# Patient Record
Sex: Female | Born: 1950
Health system: Southern US, Community
[De-identification: ages and names within clinical notes are randomized; demographics above are authoritative.]

## PROBLEM LIST (undated history)

## (undated) DIAGNOSIS — J019 Acute sinusitis, unspecified: Secondary | ICD-10-CM

## (undated) DIAGNOSIS — R011 Cardiac murmur, unspecified: Secondary | ICD-10-CM

## (undated) DIAGNOSIS — I351 Nonrheumatic aortic (valve) insufficiency: Secondary | ICD-10-CM

## (undated) DIAGNOSIS — M79604 Pain in right leg: Secondary | ICD-10-CM

## (undated) DIAGNOSIS — M25551 Pain in right hip: Secondary | ICD-10-CM

## (undated) DIAGNOSIS — I10 Essential (primary) hypertension: Secondary | ICD-10-CM

## (undated) DIAGNOSIS — E785 Hyperlipidemia, unspecified: Secondary | ICD-10-CM

## (undated) DIAGNOSIS — M8580 Other specified disorders of bone density and structure, unspecified site: Secondary | ICD-10-CM

## (undated) DIAGNOSIS — J069 Acute upper respiratory infection, unspecified: Secondary | ICD-10-CM

## (undated) DIAGNOSIS — R5383 Other fatigue: Secondary | ICD-10-CM

## (undated) DIAGNOSIS — E538 Deficiency of other specified B group vitamins: Secondary | ICD-10-CM

## (undated) DIAGNOSIS — M25512 Pain in left shoulder: Secondary | ICD-10-CM

## (undated) DIAGNOSIS — I5189 Other ill-defined heart diseases: Secondary | ICD-10-CM

## (undated) HISTORY — DX: Pain in left shoulder: M25.512

## (undated) HISTORY — DX: Essential (primary) hypertension: I10

## (undated) HISTORY — DX: Other ill-defined heart diseases: I51.89

## (undated) HISTORY — DX: Cardiac murmur, unspecified: R01.1

## (undated) HISTORY — DX: Acute upper respiratory infection, unspecified: J06.9

## (undated) HISTORY — DX: Other specified disorders of bone density and structure, unspecified site: M85.80

## (undated) HISTORY — DX: Pain in right hip: M25.551

## (undated) HISTORY — DX: Hyperlipidemia, unspecified: E78.5

## (undated) HISTORY — DX: Other fatigue: R53.83

## (undated) HISTORY — DX: Acute sinusitis, unspecified: J01.90

## (undated) HISTORY — DX: Pain in right leg: M79.604

## (undated) HISTORY — PX: BILATERAL OOPHORECTOMY: SHX1221

## (undated) HISTORY — DX: Nonrheumatic aortic (valve) insufficiency: I35.1

## (undated) HISTORY — PX: ABDOMINAL HYSTERECTOMY: SHX81

## (undated) HISTORY — DX: Deficiency of other specified B group vitamins: E53.8

---

## 1998-02-23 ENCOUNTER — Other Ambulatory Visit: Admission: RE | Admit: 1998-02-23 | Discharge: 1998-02-23 | Payer: Self-pay | Admitting: Gynecology

## 1999-04-24 ENCOUNTER — Other Ambulatory Visit: Admission: RE | Admit: 1999-04-24 | Discharge: 1999-04-24 | Payer: Self-pay | Admitting: Gynecology

## 2000-05-01 ENCOUNTER — Other Ambulatory Visit: Admission: RE | Admit: 2000-05-01 | Discharge: 2000-05-01 | Payer: Self-pay | Admitting: Gynecology

## 2001-03-30 ENCOUNTER — Encounter: Admission: RE | Admit: 2001-03-30 | Discharge: 2001-03-30 | Payer: Self-pay | Admitting: Internal Medicine

## 2001-03-30 ENCOUNTER — Encounter: Payer: Self-pay | Admitting: Internal Medicine

## 2001-07-01 ENCOUNTER — Other Ambulatory Visit: Admission: RE | Admit: 2001-07-01 | Discharge: 2001-07-01 | Payer: Self-pay | Admitting: Gynecology

## 2002-04-16 ENCOUNTER — Encounter: Payer: Self-pay | Admitting: Emergency Medicine

## 2002-04-17 ENCOUNTER — Encounter: Payer: Self-pay | Admitting: Internal Medicine

## 2002-04-17 ENCOUNTER — Inpatient Hospital Stay (HOSPITAL_COMMUNITY): Admission: EM | Admit: 2002-04-17 | Discharge: 2002-04-21 | Payer: Self-pay | Admitting: *Deleted

## 2002-04-19 ENCOUNTER — Encounter: Payer: Self-pay | Admitting: Surgery

## 2002-04-20 ENCOUNTER — Encounter: Payer: Self-pay | Admitting: Internal Medicine

## 2002-05-28 ENCOUNTER — Encounter: Payer: Self-pay | Admitting: Surgery

## 2002-05-28 ENCOUNTER — Encounter: Admission: RE | Admit: 2002-05-28 | Discharge: 2002-05-28 | Payer: Self-pay | Admitting: Surgery

## 2002-10-27 ENCOUNTER — Other Ambulatory Visit: Admission: RE | Admit: 2002-10-27 | Discharge: 2002-10-27 | Payer: Self-pay | Admitting: Gynecology

## 2003-12-01 ENCOUNTER — Other Ambulatory Visit: Admission: RE | Admit: 2003-12-01 | Discharge: 2003-12-01 | Payer: Self-pay | Admitting: Gynecology

## 2004-07-04 ENCOUNTER — Emergency Department (HOSPITAL_COMMUNITY): Admission: EM | Admit: 2004-07-04 | Discharge: 2004-07-04 | Payer: Self-pay | Admitting: Emergency Medicine

## 2007-02-25 ENCOUNTER — Ambulatory Visit: Payer: Self-pay | Admitting: Family Medicine

## 2007-02-25 ENCOUNTER — Encounter (INDEPENDENT_AMBULATORY_CARE_PROVIDER_SITE_OTHER): Payer: Self-pay | Admitting: *Deleted

## 2007-02-25 DIAGNOSIS — I10 Essential (primary) hypertension: Secondary | ICD-10-CM | POA: Insufficient documentation

## 2007-02-25 DIAGNOSIS — M25519 Pain in unspecified shoulder: Secondary | ICD-10-CM | POA: Insufficient documentation

## 2007-03-03 ENCOUNTER — Encounter: Admission: RE | Admit: 2007-03-03 | Discharge: 2007-03-03 | Payer: Self-pay | Admitting: Family Medicine

## 2007-03-04 ENCOUNTER — Encounter (INDEPENDENT_AMBULATORY_CARE_PROVIDER_SITE_OTHER): Payer: Self-pay | Admitting: *Deleted

## 2007-03-11 ENCOUNTER — Encounter: Payer: Self-pay | Admitting: Family Medicine

## 2007-03-11 ENCOUNTER — Ambulatory Visit: Payer: Self-pay

## 2007-03-13 ENCOUNTER — Telehealth (INDEPENDENT_AMBULATORY_CARE_PROVIDER_SITE_OTHER): Payer: Self-pay | Admitting: *Deleted

## 2007-06-19 ENCOUNTER — Encounter: Payer: Self-pay | Admitting: Family Medicine

## 2007-07-13 ENCOUNTER — Encounter (INDEPENDENT_AMBULATORY_CARE_PROVIDER_SITE_OTHER): Payer: Self-pay | Admitting: *Deleted

## 2007-07-28 ENCOUNTER — Ambulatory Visit: Payer: Self-pay | Admitting: Family Medicine

## 2007-07-28 DIAGNOSIS — M899 Disorder of bone, unspecified: Secondary | ICD-10-CM | POA: Insufficient documentation

## 2007-07-28 DIAGNOSIS — M949 Disorder of cartilage, unspecified: Secondary | ICD-10-CM

## 2008-02-25 ENCOUNTER — Ambulatory Visit: Payer: Self-pay | Admitting: Family Medicine

## 2008-02-25 DIAGNOSIS — E785 Hyperlipidemia, unspecified: Secondary | ICD-10-CM | POA: Insufficient documentation

## 2008-03-08 ENCOUNTER — Encounter (INDEPENDENT_AMBULATORY_CARE_PROVIDER_SITE_OTHER): Payer: Self-pay | Admitting: *Deleted

## 2008-03-08 LAB — CONVERTED CEMR LAB
Albumin: 3.6 g/dL (ref 3.5–5.2)
BUN: 8 mg/dL (ref 6–23)
Calcium: 8.7 mg/dL (ref 8.4–10.5)
Cholesterol: 179 mg/dL (ref 0–200)
Creatinine, Ser: 0.7 mg/dL (ref 0.4–1.2)
GFR calc Af Amer: 112 mL/min
Glucose, Bld: 111 mg/dL — ABNORMAL HIGH (ref 70–99)
HDL: 34.7 mg/dL — ABNORMAL LOW (ref >39.0)
LDL Cholesterol: 125 mg/dL — ABNORMAL HIGH (ref 0–99)
Total Protein: 6.4 g/dL (ref 6.0–8.3)
VLDL: 20 mg/dL (ref 0–40)

## 2008-06-20 ENCOUNTER — Ambulatory Visit (HOSPITAL_COMMUNITY): Admission: RE | Admit: 2008-06-20 | Discharge: 2008-06-20 | Payer: Self-pay | Admitting: Family Medicine

## 2008-07-07 ENCOUNTER — Encounter (INDEPENDENT_AMBULATORY_CARE_PROVIDER_SITE_OTHER): Payer: Self-pay | Admitting: *Deleted

## 2008-10-06 ENCOUNTER — Ambulatory Visit: Payer: Self-pay | Admitting: Family Medicine

## 2008-10-06 DIAGNOSIS — M25559 Pain in unspecified hip: Secondary | ICD-10-CM | POA: Insufficient documentation

## 2008-10-14 ENCOUNTER — Telehealth (INDEPENDENT_AMBULATORY_CARE_PROVIDER_SITE_OTHER): Payer: Self-pay | Admitting: *Deleted

## 2008-10-14 ENCOUNTER — Encounter (INDEPENDENT_AMBULATORY_CARE_PROVIDER_SITE_OTHER): Payer: Self-pay | Admitting: *Deleted

## 2008-10-14 DIAGNOSIS — M79609 Pain in unspecified limb: Secondary | ICD-10-CM | POA: Insufficient documentation

## 2008-10-14 LAB — CONVERTED CEMR LAB
ALT: 20 units/L (ref 0–35)
Albumin: 3.9 g/dL (ref 3.5–5.2)
BUN: 5 mg/dL — ABNORMAL LOW (ref 6–23)
Bilirubin, Direct: 0.1 mg/dL (ref 0.0–0.3)
CO2: 28 meq/L (ref 19–32)
Calcium: 9.3 mg/dL (ref 8.4–10.5)
Cholesterol: 200 mg/dL (ref 0–200)
Creatinine, Ser: 0.6 mg/dL (ref 0.4–1.2)
Glucose, Bld: 101 mg/dL — ABNORMAL HIGH (ref 70–99)
HDL: 38 mg/dL — ABNORMAL LOW (ref >39.0)
Sodium: 142 meq/L (ref 135–145)
Total Protein: 6.8 g/dL (ref 6.0–8.3)
Triglycerides: 152 mg/dL — ABNORMAL HIGH (ref 0–149)

## 2008-10-20 ENCOUNTER — Ambulatory Visit: Payer: Self-pay

## 2008-10-20 ENCOUNTER — Encounter: Payer: Self-pay | Admitting: Family Medicine

## 2008-10-27 ENCOUNTER — Encounter (INDEPENDENT_AMBULATORY_CARE_PROVIDER_SITE_OTHER): Payer: Self-pay | Admitting: *Deleted

## 2008-11-10 ENCOUNTER — Ambulatory Visit: Payer: Self-pay | Admitting: Family Medicine

## 2008-11-10 DIAGNOSIS — J069 Acute upper respiratory infection, unspecified: Secondary | ICD-10-CM | POA: Insufficient documentation

## 2008-11-10 DIAGNOSIS — J019 Acute sinusitis, unspecified: Secondary | ICD-10-CM | POA: Insufficient documentation

## 2008-12-12 ENCOUNTER — Telehealth (INDEPENDENT_AMBULATORY_CARE_PROVIDER_SITE_OTHER): Payer: Self-pay | Admitting: *Deleted

## 2009-09-11 ENCOUNTER — Telehealth (INDEPENDENT_AMBULATORY_CARE_PROVIDER_SITE_OTHER): Payer: Self-pay | Admitting: *Deleted

## 2009-10-09 ENCOUNTER — Ambulatory Visit: Payer: Self-pay | Admitting: Family Medicine

## 2009-10-09 DIAGNOSIS — R011 Cardiac murmur, unspecified: Secondary | ICD-10-CM | POA: Insufficient documentation

## 2009-10-09 DIAGNOSIS — R5383 Other fatigue: Secondary | ICD-10-CM

## 2009-10-09 DIAGNOSIS — R5381 Other malaise: Secondary | ICD-10-CM | POA: Insufficient documentation

## 2009-10-11 LAB — CONVERTED CEMR LAB
ALT: 21 units/L (ref 0–35)
AST: 23 units/L (ref 0–37)
Albumin: 3.9 g/dL (ref 3.5–5.2)
Eosinophils Relative: 1.7 % (ref 0.0–5.0)
Folate: 14.6 ng/mL
GFR calc non Af Amer: 109.43 mL/min (ref >60)
Glucose, Bld: 68 mg/dL — ABNORMAL LOW (ref 70–99)
HCT: 39.7 % (ref 36.0–46.0)
Hemoglobin: 13.2 g/dL (ref 12.0–15.0)
Lymphs Abs: 2 10*3/uL (ref 0.7–4.0)
Monocytes Relative: 7.9 % (ref 3.0–12.0)
Neutro Abs: 3.7 10*3/uL (ref 1.4–7.7)
Potassium: 3.6 meq/L (ref 3.5–5.1)
RDW: 14.2 % (ref 11.5–14.6)
Sodium: 143 meq/L (ref 135–145)
TSH: 0.89 microintl units/mL (ref 0.35–5.50)
VLDL: 31.4 mg/dL (ref 0.0–40.0)
WBC: 6.3 10*3/uL (ref 4.5–10.5)

## 2009-10-12 ENCOUNTER — Ambulatory Visit: Payer: Self-pay | Admitting: Family Medicine

## 2009-10-19 ENCOUNTER — Ambulatory Visit: Payer: Self-pay | Admitting: Family Medicine

## 2009-10-19 DIAGNOSIS — E538 Deficiency of other specified B group vitamins: Secondary | ICD-10-CM | POA: Insufficient documentation

## 2009-10-22 ENCOUNTER — Emergency Department (HOSPITAL_COMMUNITY): Admission: EM | Admit: 2009-10-22 | Discharge: 2009-10-22 | Payer: Self-pay | Admitting: Family Medicine

## 2009-10-26 ENCOUNTER — Ambulatory Visit: Payer: Self-pay | Admitting: Family Medicine

## 2009-11-16 ENCOUNTER — Ambulatory Visit: Payer: Self-pay

## 2009-11-16 ENCOUNTER — Ambulatory Visit: Payer: Self-pay | Admitting: Cardiology

## 2009-11-16 ENCOUNTER — Encounter: Payer: Self-pay | Admitting: Family Medicine

## 2009-11-16 ENCOUNTER — Ambulatory Visit (HOSPITAL_COMMUNITY): Admission: RE | Admit: 2009-11-16 | Discharge: 2009-11-16 | Payer: Self-pay | Admitting: Family Medicine

## 2009-11-19 DIAGNOSIS — I519 Heart disease, unspecified: Secondary | ICD-10-CM | POA: Insufficient documentation

## 2009-11-27 ENCOUNTER — Ambulatory Visit: Payer: Self-pay | Admitting: Family Medicine

## 2009-12-12 ENCOUNTER — Ambulatory Visit: Payer: Self-pay | Admitting: Cardiovascular Disease

## 2009-12-12 DIAGNOSIS — R9389 Abnormal findings on diagnostic imaging of other specified body structures: Secondary | ICD-10-CM | POA: Insufficient documentation

## 2009-12-18 ENCOUNTER — Telehealth: Payer: Self-pay | Admitting: Cardiovascular Disease

## 2009-12-19 ENCOUNTER — Encounter (INDEPENDENT_AMBULATORY_CARE_PROVIDER_SITE_OTHER): Payer: Self-pay | Admitting: *Deleted

## 2009-12-19 LAB — CONVERTED CEMR LAB
CO2: 26 meq/L (ref 19–32)
Calcium: 9.2 mg/dL (ref 8.4–10.5)
Creatinine, Ser: 0.7 mg/dL (ref 0.4–1.2)
Glucose, Bld: 84 mg/dL (ref 70–99)

## 2009-12-26 ENCOUNTER — Ambulatory Visit: Payer: Self-pay | Admitting: Cardiology

## 2009-12-26 ENCOUNTER — Ambulatory Visit (HOSPITAL_COMMUNITY): Admission: RE | Admit: 2009-12-26 | Discharge: 2009-12-26 | Payer: Self-pay | Admitting: Cardiovascular Disease

## 2009-12-27 ENCOUNTER — Ambulatory Visit: Payer: Self-pay | Admitting: Family Medicine

## 2010-01-04 ENCOUNTER — Ambulatory Visit: Payer: Self-pay | Admitting: Cardiovascular Disease

## 2010-01-04 DIAGNOSIS — I359 Nonrheumatic aortic valve disorder, unspecified: Secondary | ICD-10-CM | POA: Insufficient documentation

## 2010-01-24 ENCOUNTER — Ambulatory Visit: Payer: Self-pay | Admitting: Family Medicine

## 2010-01-25 ENCOUNTER — Emergency Department (HOSPITAL_COMMUNITY): Admission: EM | Admit: 2010-01-25 | Discharge: 2010-01-25 | Payer: Self-pay | Admitting: Emergency Medicine

## 2010-02-21 ENCOUNTER — Ambulatory Visit: Payer: Self-pay | Admitting: Internal Medicine

## 2010-03-19 ENCOUNTER — Ambulatory Visit: Payer: Self-pay | Admitting: Family Medicine

## 2010-04-17 ENCOUNTER — Ambulatory Visit: Payer: Self-pay | Admitting: Family Medicine

## 2010-05-15 ENCOUNTER — Ambulatory Visit: Payer: Self-pay | Admitting: Family Medicine

## 2010-05-16 LAB — CONVERTED CEMR LAB: Vitamin B-12: >1500 pg/mL — ABNORMAL HIGH (ref 211–911)

## 2010-06-08 ENCOUNTER — Ambulatory Visit: Payer: Self-pay | Admitting: Family Medicine

## 2010-06-15 ENCOUNTER — Telehealth (INDEPENDENT_AMBULATORY_CARE_PROVIDER_SITE_OTHER): Payer: Self-pay | Admitting: *Deleted

## 2010-07-03 ENCOUNTER — Ambulatory Visit (HOSPITAL_COMMUNITY): Admission: RE | Admit: 2010-07-03 | Discharge: 2010-07-03 | Payer: Self-pay | Admitting: Family Medicine

## 2010-09-23 LAB — CONVERTED CEMR LAB
ALT: 22 units/L (ref 0–35)
AST: 21 units/L (ref 0–37)
AST: 23 units/L (ref 0–37)
Albumin: 3.8 g/dL (ref 3.5–5.2)
Alkaline Phosphatase: 82 units/L (ref 39–117)
Alkaline Phosphatase: 87 units/L (ref 39–117)
BUN: 4 mg/dL — ABNORMAL LOW (ref 6–23)
BUN: 5 mg/dL — ABNORMAL LOW (ref 6–23)
Basophils Relative: 0 % (ref 0.0–1.0)
Bilirubin, Direct: 0.1 mg/dL (ref 0.0–0.3)
CO2: 30 meq/L (ref 19–32)
Calcium: 9.1 mg/dL (ref 8.4–10.5)
Chloride: 104 meq/L (ref 96–112)
Chloride: 106 meq/L (ref 96–112)
Cholesterol: 207 mg/dL (ref 0–200)
Cholesterol: 209 mg/dL (ref 0–200)
Creatinine, Ser: 0.6 mg/dL (ref 0.4–1.2)
Direct LDL: 149.3 mg/dL
Eosinophils Relative: 2.8 % (ref 0.0–5.0)
GFR calc Af Amer: 134 mL/min
GFR calc non Af Amer: 110 mL/min
Glucose, Bld: 105 mg/dL — ABNORMAL HIGH (ref 70–99)
HDL: 36.1 mg/dL — ABNORMAL LOW (ref >39.0)
Lymphocytes Relative: 30.2 % (ref 12.0–46.0)
Monocytes Relative: 7 % (ref 3.0–11.0)
Neutro Abs: 3.2 10*3/uL (ref 1.4–7.7)
Platelets: 340 10*3/uL (ref 150–400)
Potassium: 4 meq/L (ref 3.5–5.1)
RBC: 4.2 M/uL (ref 3.87–5.11)
RDW: 16.9 % — ABNORMAL HIGH (ref 11.5–14.6)
Sodium: 140 meq/L (ref 135–145)
Total CHOL/HDL Ratio: 5.4
Total CHOL/HDL Ratio: 5.8
Total Protein: 7.1 g/dL (ref 6.0–8.3)
Triglycerides: 188 mg/dL — ABNORMAL HIGH (ref 0–149)
VLDL: 23 mg/dL (ref 0–40)
VLDL: 38 mg/dL (ref 0–40)
WBC: 5.5 10*3/uL (ref 4.5–10.5)

## 2010-09-25 NOTE — Assessment & Plan Note (Signed)
Summary: 3wk/sl   Visit Type:  3 wk f/u Primary Provider:  Laury Axon  CC:  no cardiac complaints today.  History of Present Illness: 60 yo WF with history of HTN and borderline hyperlipidemia here today for follow up. She was seen several weeks ago for evaluation of abnormal echocardiogram. She tells me that she was seen recently by Dr. Laury Axon and her murmur seemed louder so an echo was ordered at that time. She has had no chest pain, SOB, palpitations, near syncope, syncope, dizziness, fatigue, lower extremity edema. Her echo showed normal LV systolic function, diastolic dysfunction, mild AI, small effusion and possible epicardial mass/aortic root mass. I ordered a cardiac MRI which showed prominent epicardial fat pad with no evidence of aortic or pericardial tumor or mass. She has no complaints today.    Current Medications (verified): 1)  Lisinopril-Hydrochlorothiazide 10-12.5 Mg  Tabs (Lisinopril-Hydrochlorothiazide) .Marland Kitchen.. 1 By Mouth Once Daily 2)  Klor-Con M20 20 Meq  Tbcr (Potassium Chloride Crys Cr) .... Take One Tablet Twice Daily. 3)  Actonel 150 Mg  Tabs (Risedronate Sodium) .Marland Kitchen.. 1 By Mouth Monthly 4)  Juice Plus Fibre  Liqd (Nutritional Supplements) 5)  Vitamin D (Ergocalciferol) 50000 Unit Caps (Ergocalciferol) .Marland Kitchen.. 1 By Mouth Once Weekly. 6)  Vitamin D3 2000 Unit Caps (Cholecalciferol) .Marland Kitchen.. 1 By Mouth Daily. 7)  Aspirin 81 Mg Tbec (Aspirin) .... Take One Tablet By Mouth Daily  Allergies (verified): No Known Drug Allergies  Past History:  Past Medical History: HYPERTENSION (ICD-401.9) Mild Aortic insufficiency HYPERLIPIDEMIA (ICD-272.4) DIASTOLIC DYSFUNCTION (ICD-429.9) CARDIAC MURMUR (ICD-785.2) FATIGUE (ICD-780.79) LEG PAIN, RIGHT (ICD-729.5) HIP PAIN, RIGHT (ICD-719.45) B12 DEFICIENCY (ICD-266.2) SINUSITIS - ACUTE-NOS (ICD-461.9) URI (ICD-465.9) OSTEOPENIA (ICD-733.90) SHOULDER PAIN, LEFT (ICD-719.41) PREVENTIVE HEALTH CARE (ICD-V70.0) FAMILY HISTORY DIABETES 1ST  DEGREE RELATIVE (ICD-V18.0)  Social History: Reviewed history from 12/12/2009 and no changes required. Married, 2 children, 6 grandchildren Former Smoker-smoked for 10 years Alcohol use-no Drug use-no Stays at home Regular exercise-yes  Review of Systems  The patient denies fatigue, malaise, fever, weight gain/loss, vision loss, decreased hearing, hoarseness, chest pain, palpitations, shortness of breath, prolonged cough, wheezing, sleep apnea, coughing up blood, abdominal pain, blood in stool, nausea, vomiting, diarrhea, heartburn, incontinence, blood in urine, muscle weakness, joint pain, leg swelling, rash, skin lesions, headache, fainting, dizziness, depression, anxiety, enlarged lymph nodes, easy bruising or bleeding, and environmental allergies.    Vital Signs:  Patient profile:   60 year old female Height:      64 inches Weight:      167 pounds BMI:     28.77 Pulse rate:   88 / minute Pulse rhythm:   regular BP sitting:   110 / 70  (left arm) Cuff size:   regular  Vitals Entered By: Danielle Rankin, CMA (Jan 04, 2010 3:27 PM)  Physical Exam  General:  General: Well developed, well nourished, NAD HEENT: OP clear, mucus membranes moist Psychiatric: Mood and affect normal Neck: No JVD, no carotid bruits, no thyromegaly, no lymphadenopathy. Lungs:Clear bilaterally, no wheezes, rhonci, crackles CV: RRR no murmurs, gallops rubs Abdomen: soft, NT, ND, BS present Extremities: No edema, pulses 2+.    MRI EXAM  Procedure date:  12/26/2009  Findings:      IMPRESSION: 1. Premature termination of the exam due to claustrophobia with minimal sequences obtained.   2. Prominent epicardial fat encasing the heart.  No pericardial mass noted.  There was also lipomatous atrial septal hypertrophy.   3. Normal LV and RV size and systolic function.  4. Only limited views of the ascending aorta were obtained.  There was no abnormality seen on those limited views.   Impression &  Recommendations:  Problem # 1:  AORTIC VALVE DISORDERS (ICD-424.1) Mild aortic insufficiency with diastolic dysfunction. Repeat echo one year. Cardiac MRI without pericardial mass or aortic mass.   Her updated medication list for this problem includes:    Lisinopril-hydrochlorothiazide 10-12.5 Mg Tabs (Lisinopril-hydrochlorothiazide) .Marland Kitchen... 1 by mouth once daily  Problem # 2:  DIASTOLIC DYSFUNCTION (ICD-429.9)  Volume ok. BP well controlled.   Patient Instructions: 1)  Your physician recommends that you schedule a follow-up appointment in: 1 year 2)  Your physician has requested that you have an echocardiogram.  Echocardiography is a painless test that uses sound waves to create images of your heart. It provides your doctor with information about the size and shape of your heart and how well your heart's chambers and valves are working.  This procedure takes approximately one hour. There are no restrictions for this procedure.  To be done in 1 year

## 2010-09-25 NOTE — Assessment & Plan Note (Signed)
Summary: B12//KN  Nurse Visit   Allergies: No Known Drug Allergies  Medication Administration  Injection # 1:    Medication: Vit B12 1000 mcg    Diagnosis: B12 DEFICIENCY (ICD-266.2)    Route: IM    Site: L deltoid    Exp Date: 10/25/2011    Lot #: [id number]    Mfr: American Regent    Patient tolerated injection without complications    Given by: Jeremy Johann CMA (March 19, 2010 11:06 AM)  Orders Added: 1)  Admin of Therapeutic Inj  intramuscular or subcutaneous [96372] 2)  Vit B12 1000 mcg [J3420]

## 2010-09-25 NOTE — Progress Notes (Signed)
Summary: pls explain echo again  Phone Note Call from Patient Call back at Home Phone [phone] Call back at Work Phone [phone]   Caller: Patient Reason for Call: Talk to Nurse Summary of Call: from last office visit,  pls explain echo again. pt aware she is suppose to be set up for cardiac mri.  Initial call taken by: Lorne Skeens,  December 18, 2009 11:34 AM  Follow-up for Phone Call        talked with patient--has not heard about scheduling cardiac MRI--Crystal at [facility] is out until 12-19-09 and  Marlowe Kays  will call pt after talking with Crystal tomorrow

## 2010-09-25 NOTE — Assessment & Plan Note (Signed)
Summary: FOR MED REFILL//PH   Vital Signs:  Patient profile:   60 year old female Height:      64 inches Weight:      169 pounds BMI:     29.11 Temp:     98.2 degrees F oral Pulse rate:   80 / minute Pulse rhythm:   regular BP sitting:   124 / 78  (left arm) Cuff size:   regular  Vitals Entered By: Army Fossa CMA (October 09, 2009 11:15 AM) CC: Pt here for med refill (on all meds)    History of Present Illness: Pt here for med refills.  Pt c/o extreme fatigue and she is gaining weight.  She is requesting her thyroid be checked.   Hypertension follow-up      This is a 60 year old woman who presents for Hypertension follow-up.  The patient complains of fatigue, but denies lightheadedness, urinary frequency, headaches, edema, impotence, and rash.  The patient denies the following associated symptoms: chest pain, chest pressure, exercise intolerance, dyspnea, palpitations, syncope, leg edema, and pedal edema.  Compliance with medications (by patient report) has been near 100%.  The patient reports that dietary compliance has been good.  The patient reports exercising daily.  Adjunctive measures currently used by the patient include salt restriction.    Hyperlipidemia follow-up      The patient also presents for Hyperlipidemia follow-up.  The patient complains of fatigue, but denies muscle aches, GI upset, abdominal pain, flushing, itching, constipation, and diarrhea.  The patient denies the following symptoms: chest pain/pressure, exercise intolerance, dypsnea, palpitations, syncope, and pedal edema.  Compliance with medications (by patient report) has been near 100%.  Dietary compliance has been good.  The patient reports exercising daily.  Adjunctive measures currently used by the patient include ASA.    Current Medications (verified): 1)  Lisinopril-Hydrochlorothiazide 10-12.5 Mg  Tabs (Lisinopril-Hydrochlorothiazide) .Marland Kitchen.. 1 By Mouth Once Daily 2)  Klor-Con M20 20 Meq  Tbcr  (Potassium Chloride Crys Cr) .... Take One Tablet Twice Daily. 3)  Actonel 150 Mg  Tabs (Risedronate Sodium) .Marland Kitchen.. 1 By Mouth Monthly 4)  Juice Plus Fibre  Liqd (Nutritional Supplements)  Allergies (verified): No Known Drug Allergies  Past History:  Past medical, surgical, family and social histories (including risk factors) reviewed for relevance to current acute and chronic problems.  Past Medical History: Reviewed history from 02/25/2008 and no changes required. Hypertension Osteopenia Hyperlipidemia  Past Surgical History: Reviewed history from 02/25/2007 and no changes required. 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 Family History Hypertension:MOTHER AND FATHER CANCER-MOTHER(BREAST)  Family History Diabetes 1st degree relative-FATHER(DECEASED) HEART-MOTHER(LIVING) SIBLINGS:2-SISTER'S, 1-BROTHER  Social History: Reviewed history from 02/25/2007 and no changes required. Married Former Smoker Alcohol use-no Drug use-no Regular exercise-yes  Review of Systems      See HPI  Physical Exam  General:  Well-developed,well-nourished,in no acute distress; alert,appropriate and cooperative throughout examination Neck:  No deformities, masses, or tenderness noted. Lungs:  Normal respiratory effort, chest expands symmetrically. Lungs are clear to auscultation, no crackles or wheezes. Heart:  normal rate and Grade  2 /6 systolic ejection murmur.   Extremities:  No clubbing, cyanosis, edema, or deformity noted with normal full range of motion of all joints.   Cervical Nodes:  No lymphadenopathy noted Psych:  Cognition and judgment appear intact. Alert and cooperative with normal attention span and concentration. No apparent delusions, illusions, hallucinations   Impression & Recommendations:  Problem # 1:  HYPERLIPIDEMIA (ICD-272.4)  Orders: Venipuncture ([address]) TLB-B12 +  Folate Pnl (82956_21308-M57/QIO) TLB-Lipid Panel (80061-LIPID) TLB-BMP (Basic Metabolic Panel-BMET) (80048-METABOL) TLB-CBC Platelet - w/Differential (85025-CBCD) TLB-Hepatic/Liver Function Pnl (80076-HEPATIC) TLB-TSH (Thyroid Stimulating Hormone) (84443-TSH) TLB-T3, Free (Triiodothyronine) (84481-T3FREE) TLB-T4 (Thyrox), Free [phone]) T-Vitamin D (25-Hydroxy) (32440-10272)  Problem # 2:  OSTEOPENIA (ICD-733.90)  Her updated medication list for this problem includes:    Actonel 150 Mg Tabs (Risedronate sodium) .Marland Kitchen... 1 by mouth monthly  Orders: Venipuncture (53664) TLB-B12 + Folate Pnl (40347_42595-G38/VFI) TLB-Lipid Panel (80061-LIPID) TLB-BMP (Basic Metabolic Panel-BMET) (80048-METABOL) TLB-CBC Platelet - w/Differential (85025-CBCD) TLB-Hepatic/Liver Function Pnl (80076-HEPATIC) TLB-TSH (Thyroid Stimulating Hormone) (84443-TSH) TLB-T3, Free (Triiodothyronine) (84481-T3FREE) TLB-T4 (Thyrox), Free [phone]) T-Vitamin D (25-Hydroxy) [phone])  Problem # 3:  CARDIAC MURMUR (ICD-785.2)  Orders: Echo Referral (Echo)  Problem # 4:  FATIGUE (ICD-780.79)  Orders: Venipuncture (01093) TLB-B12 + Folate Pnl (23557_32202-R42/HCW) TLB-Lipid Panel (80061-LIPID) TLB-BMP (Basic Metabolic Panel-BMET) (80048-METABOL) TLB-CBC Platelet - w/Differential (85025-CBCD) TLB-Hepatic/Liver Function Pnl (80076-HEPATIC) TLB-TSH (Thyroid Stimulating Hormone) (84443-TSH) TLB-T3, Free (Triiodothyronine) (84481-T3FREE) TLB-T4 (Thyrox), Free [phone]) T-Vitamin D (25-Hydroxy) (17616-07371)  Problem # 5:  HYPERTENSION (ICD-401.9)  Her updated medication list for this problem includes:    Lisinopril-hydrochlorothiazide 10-12.5 Mg Tabs (Lisinopril-hydrochlorothiazide) .Marland Kitchen... 1 by mouth once daily  Orders: Venipuncture (06269) TLB-B12 + Folate Pnl (48546_27035-K09/FGH) TLB-Lipid Panel (80061-LIPID) TLB-BMP (Basic Metabolic Panel-BMET) (80048-METABOL) TLB-CBC Platelet - w/Differential  (85025-CBCD) TLB-Hepatic/Liver Function Pnl (80076-HEPATIC) TLB-TSH (Thyroid Stimulating Hormone) (84443-TSH) TLB-T3, Free (Triiodothyronine) (84481-T3FREE) TLB-T4 (Thyrox), Free [phone]) T-Vitamin D (25-Hydroxy) [phone]) Echo Referral (Echo)  BP today: 124/78 Prior BP: 100/72 (11/10/2008)  Labs Reviewed: K+: 3.4 (10/06/2008) Creat: : 0.6 (10/06/2008)   Chol: 200 (10/06/2008)   HDL: 38.0 (10/06/2008)   LDL: 132 (10/06/2008)   TG: 152 (10/06/2008)  Complete Medication List: 1)  Lisinopril-hydrochlorothiazide 10-12.5 Mg Tabs (Lisinopril-hydrochlorothiazide) .Marland Kitchen.. 1 by mouth once daily 2)  Klor-con M20 20 Meq Tbcr (Potassium chloride crys cr) .... Take one tablet twice daily. 3)  Actonel 150 Mg Tabs (Risedronate sodium) .Marland Kitchen.. 1 by mouth monthly 4)  Juice Plus Fibre Liqd (Nutritional supplements) Prescriptions: LISINOPRIL-HYDROCHLOROTHIAZIDE 10-12.5 MG  TABS (LISINOPRIL-HYDROCHLOROTHIAZIDE) 1 by mouth once daily  #30 Tablet x 5   Entered and Authorized by:   Loreen Freud DO   Signed by:   Loreen Freud DO on 10/09/2009   Method used:   Electronically to        [facility] [phone]* (retail)       [address]       [place], [place]  [address]       Ph: [phone] or [phone]       Fax: [phone]   RxID:   [id number] KLOR-CON M20 20 MEQ  TBCR (POTASSIUM CHLORIDE CRYS CR) TAKE ONE TABLET TWICE DAILY.  #60 x 5   Entered and Authorized by:   Loreen Freud DO   Signed by:   Loreen Freud DO on 10/09/2009   Method used:   Electronically to        [facility] [phone]* (retail)       [address]       [place], [place]  [address]       Ph: [phone] or [phone]       Fax: [phone]   RxID:   [id number]

## 2010-09-25 NOTE — Letter (Signed)
Summary: Appointment - Cardiac MRI  [facility]     [place], [place]    Phone:   Fax:       December 19, 2009 MRN: [id number]   Dominique Gonzalez [address] [place], [place]  [address]   Dear [name],   We have scheduled the above patient for an appointment for a Cardiac MRI on May 3,2011 at  2:00 p.m.  Please refer to the below information for the location and instructions for this test:  Location:     [facility]       [address]       [place], [place]  [address] Instructions:    Wilmon Arms at [facility] Registration 45 minutes prior to your appointment time.  This will ensure you are in the Radiology Department 30 minutes prior to your appointment.    There are no restrictions for this test you may eat and take medications as usual.  If you need to reschedule this appointment please call at the number listed above.  Sincerely,       Lorne Skeens  [facility] Scheduling Team

## 2010-09-25 NOTE — Assessment & Plan Note (Signed)
Summary: b-12 level then b12 inj/cbs  Nurse Visit   Allergies: No Known Drug Allergies  Medication Administration  Injection # 1:    Medication: Vit B12 1000 mcg    Diagnosis: B12 DEFICIENCY (ICD-266.2)    Route: IM    Site: R deltoid    Exp Date: 10/25/2011    Lot #: [id number]    Mfr: American Regent    Patient tolerated injection without complications    Given by: Almeta Monas CMA Duncan Dull) (May 15, 2010 11:48 AM)  Orders Added: 1)  Admin of Therapeutic Inj  intramuscular or subcutaneous [96372] 2)  Vit B12 1000 mcg [J3420]    Appended Document: b-12 level then b12 inj/cbs

## 2010-09-25 NOTE — Progress Notes (Signed)
  Phone Note Refill Request Message from:  Pharmacy  Refills Requested: Medication #1:  LISINOPRIL-HYDROCHLOROTHIAZIDE 10-12.5 MG  TABS 1 by mouth once daily [facility] [address]................Marland KitchenFelecia Deloach CMA  June 15, 2010 8:08 AM      Prescriptions: LISINOPRIL-HYDROCHLOROTHIAZIDE 10-12.5 MG  TABS (LISINOPRIL-HYDROCHLOROTHIAZIDE) 1 by mouth once daily  #30 Tablet x 5   Entered by:   Jeremy Johann CMA   Authorized by:   Loreen Freud DO   Signed by:   Jeremy Johann CMA on 06/15/2010   Method used:   Printed then faxed to ...       [facility] [phone]* (retail)       [address]       [place], [place]  [address]       Ph: [phone] or [phone]       Fax: [phone]   RxID:   [id number]

## 2010-09-25 NOTE — Assessment & Plan Note (Signed)
Summary: FOR SINUS INFECTION//PH   Vital Signs:  Patient profile:   60 year old female Height:      64 inches (162.56 cm) Weight:      169 pounds (76.82 kg) BMI:     29.11 Temp:     98.1 degrees F (36.72 degrees C) oral BP sitting:   104 / 66  (right arm) Cuff size:   regular  Vitals Entered By: Dominique Gonzalez CMA (June 08, 2010 2:01 PM) CC: C/O sinus infection and ears feeling funny x2 days./kb, URI symptoms Is Patient Diabetic? No Pain Assessment Patient in pain? no      Comments Patient notes that she has been having cough, HA, sinus pressure, and nausea. She denies congestion, mucous production, sore throat, fever, and vomiting./kb   CC:  C/O sinus infection and ears feeling funny x2 days./kb and URI symptoms.  History of Present Illness:  URI Symptoms      This is a 60 year old woman who presents with URI symptoms.  The symptoms began 2 days ago.  Pt here c/o head pressure and ear pain.  The patient reports earache, but denies nasal congestion, clear nasal discharge, purulent nasal discharge, and sore throat.  The patient denies fever, low-grade fever (<100.5 degrees), fever of 100.5-103 degrees, fever of 103.1-104 degrees, fever to >104 degrees, stiff neck, dyspnea, wheezing, rash, vomiting, diarrhea, use of an antipyretic, and response to antipyretic.  The patient also reports headache.  The patient denies the following risk factors for Strep sinusitis: unilateral facial pain, unilateral nasal discharge, poor response to decongestant, double sickening, tooth pain, Strep exposure, tender adenopathy, and absence of cough.    Current Medications (verified): 1)  Lisinopril-Hydrochlorothiazide 10-12.5 Mg  Tabs (Lisinopril-Hydrochlorothiazide) .Marland Kitchen.. 1 By Mouth Once Daily 2)  Klor-Con M20 20 Meq  Tbcr (Potassium Chloride Crys Cr) .... Take One Tablet Twice Daily. 3)  Actonel 150 Mg  Tabs (Risedronate Sodium) .Marland Kitchen.. 1 By Mouth Monthly 4)  Juice Plus Fibre  Liqd (Nutritional  Supplements) 5)  Vitamin D (Ergocalciferol) 50000 Unit Caps (Ergocalciferol) .Marland Kitchen.. 1 By Mouth Once Weekly. 6)  Vitamin D3 2000 Unit Caps (Cholecalciferol) .Marland Kitchen.. 1 By Mouth Daily. 7)  Aspirin 81 Mg Tbec (Aspirin) .... Take One Tablet By Mouth Daily 8)  Veramyst 27.5 Mcg/spray Susp (Fluticasone Furoate) .... 2 Sprays Each Nostril Once Daily 9)  Astepro 0.15 % Soln (Azelastine Hcl) .... 2 Sprays Each Nostril Once Daily 10)  Clarinex 5 Mg Tabs (Desloratadine) .Marland Kitchen.. 1 By Mouth Once Daily As Needed  Allergies (verified): No Known Drug Allergies  Past History:  Past medical, surgical, family and social histories (including risk factors) reviewed for relevance to current acute and chronic problems.  Past Medical History: Reviewed history from 01/04/2010 and no changes required. HYPERTENSION (ICD-401.9) Mild Aortic insufficiency HYPERLIPIDEMIA (ICD-272.4) DIASTOLIC DYSFUNCTION (ICD-429.9) CARDIAC MURMUR (ICD-785.2) FATIGUE (ICD-780.79) LEG PAIN, RIGHT (ICD-729.5) HIP PAIN, RIGHT (ICD-719.45) B12 DEFICIENCY (ICD-266.2) SINUSITIS - ACUTE-NOS (ICD-461.9) URI (ICD-465.9) OSTEOPENIA (ICD-733.90) SHOULDER PAIN, LEFT (ICD-719.41) PREVENTIVE HEALTH CARE (ICD-V70.0) FAMILY HISTORY DIABETES 1ST DEGREE RELATIVE (ICD-V18.0)  Past Surgical History: Reviewed history from 12/12/2009 and no changes required. 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 Family History Hypertension:MOTHER AND FATHER CANCER-MOTHER(BREAST) Family History Diabetes 1st degree relative-FATHER(DECEASED) HEART-MOTHER(LIVING) SIBLINGS:2-SISTER'S, 1-BROTHER  Mother- alive, breast cancer, HTN Father-deceased, ? cause on HD with 2 sisters alive, youngest with "enlarged heart" 1 brother alive and healthy  Social History: Reviewed history from 12/12/2009 and  no changes required. Married, 2 children, 6  grandchildren Former Smoker-smoked for 10 years Alcohol use-no Drug use-no Stays at home Regular exercise-yes  Review of Systems      See HPI  Physical Exam  General:  Well-developed,well-nourished,in no acute distress; alert,appropriate and cooperative throughout examination Ears:  + fluid b/l  Nose:  External nasal examination shows no deformity or inflammation. Nasal mucosa are pink and moist without lesions or exudates. Mouth:  Oral mucosa and oropharynx without lesions or exudates.  Teeth in good repair. Neck:  No deformities, masses, or tenderness noted. Lungs:  Normal respiratory effort, chest expands symmetrically. Lungs are clear to auscultation, no crackles or wheezes. Heart:  normal rate and no murmur.   Psych:  Cognition and judgment appear intact. Alert and cooperative with normal attention span and concentration. No apparent delusions, illusions, hallucinations   Impression & Recommendations:  Problem # 1:  URI (ICD-465.9)  Her updated medication list for this problem includes:    Aspirin 81 Mg Tbec (Aspirin) .Marland Kitchen... Take one tablet by mouth daily    Clarinex 5 Mg Tabs (Desloratadine) .Marland Kitchen... 1 by mouth once daily as needed  Instructed on symptomatic treatment. Call if symptoms persist or worsen.   Complete Medication List: 1)  Lisinopril-hydrochlorothiazide 10-12.5 Mg Tabs (Lisinopril-hydrochlorothiazide) .Marland Kitchen.. 1 by mouth once daily 2)  Klor-con M20 20 Meq Tbcr (Potassium chloride crys cr) .... Take one tablet twice daily. 3)  Actonel 150 Mg Tabs (Risedronate sodium) .Marland Kitchen.. 1 by mouth monthly 4)  Juice Plus Fibre Liqd (Nutritional supplements) 5)  Vitamin D (ergocalciferol) 50000 Unit Caps (Ergocalciferol) .Marland Kitchen.. 1 by mouth once weekly. 6)  Vitamin D3 2000 Unit Caps (Cholecalciferol) .Marland Kitchen.. 1 by mouth daily. 7)  Aspirin 81 Mg Tbec (Aspirin) .... Take one tablet by mouth daily 8)  Veramyst 27.5 Mcg/spray Susp (Fluticasone furoate) .... 2 sprays each nostril once daily 9)   Astepro 0.15 % Soln (Azelastine hcl) .... 2 sprays each nostril once daily 10)  Clarinex 5 Mg Tabs (Desloratadine) .Marland Kitchen.. 1 by mouth once daily as needed  Patient Instructions: 1)  Get plenty of rest, drink lots of clear liquids, and use Tylenol or Ibuprofen for fever and comfort. Return in 7-10 days if you're not better: sooner if you'er feeling worse.

## 2010-09-25 NOTE — Progress Notes (Signed)
Summary: refill  Phone Note Refill Request Message from:  Fax from [facility] fax [phone]  Refills Requested: Medication #1:  LISINOPRIL-HYDROCHLOROTHIAZIDE 10-12.5 MG  TABS 1 by mouth once daily Initial call taken by: Barb Merino,  September 11, 2009 12:20 PM    Prescriptions: LISINOPRIL-HYDROCHLOROTHIAZIDE 10-12.5 MG  TABS (LISINOPRIL-HYDROCHLOROTHIAZIDE) 1 by mouth once daily  #30 Tablet x 0   Entered by:   Army Fossa CMA   Authorized by:   Loreen Freud DO   Signed by:   Army Fossa CMA on 09/11/2009   Method used:   Electronically to        [facility] [phone]* (retail)       [address]       [place], [place]  [address]       Ph: [phone] or [phone]       Fax: [phone]   RxID:   [id number]

## 2010-09-25 NOTE — Assessment & Plan Note (Signed)
Summary: b-12//ph  time12:00  Nurse Visit   Allergies: No Known Drug Allergies  Medication Administration  Injection # 1:    Medication: Vit B12 1000 mcg    Diagnosis: B12 DEFICIENCY (ICD-266.2)    Route: IM    Site: L deltoid    Exp Date: 09/2010    Lot #: [id number]    Mfr: American Regent    Patient tolerated injection without complications    Given by: Shonna Chock (November 27, 2009 12:05 PM)  Orders Added: 1)  Vit B12 1000 mcg [J3420] 2)  Admin of Therapeutic Inj  intramuscular or subcutaneous [96372]  Patient aware at next B12 injection she is due to have labs (b12 level checked and then injection)./ Shonna Chock  November 27, 2009 12:06 PM

## 2010-09-25 NOTE — Assessment & Plan Note (Signed)
Summary: B-12 SHOT//PH  Nurse Visit   Allergies: No Known Drug Allergies  Medication Administration  Injection # 1:    Medication: Vit B12 1000 mcg    Diagnosis: B12 DEFICIENCY (ICD-266.2)    Route: IM    Site: R deltoid    Exp Date: 06/27/2011    Lot #: [id number]    Mfr: [employer] Regent    Given by: Doristine Devoid (October 26, 2009 12:07 PM)  Orders Added: 1)  Vit B12 1000 mcg [J3420] 2)  Admin of Therapeutic Inj  intramuscular or subcutaneous [96372]     Medication Administration  Injection # 1:    Medication: Vit B12 1000 mcg    Diagnosis: B12 DEFICIENCY (ICD-266.2)    Route: IM    Site: R deltoid    Exp Date: 06/27/2011    Lot #: [id number]    Mfr: [employer] Regent    Given by: Doristine Devoid (October 26, 2009 12:07 PM)  Orders Added: 1)  Vit B12 1000 mcg [J3420] 2)  Admin of Therapeutic Inj  intramuscular or subcutaneous [19147]

## 2010-09-25 NOTE — Assessment & Plan Note (Signed)
Summary: b-12/cbs  Nurse Visit   Allergies: No Known Drug Allergies  Medication Administration  Injection # 1:    Medication: Vit B12 1000 mcg    Diagnosis: B12 DEFICIENCY (ICD-266.2)    Route: IM    Site: R deltoid    Exp Date: 11/2011    Lot #: [id number]    Mfr: [employer]    Patient tolerated injection without complications    Given by: Army Fossa CMA (January 24, 2010 9:18 AM)  Orders Added: 1)  Admin of Therapeutic Inj  intramuscular or subcutaneous [96372] 2)  Vit B12 1000 mcg [J3420]

## 2010-09-25 NOTE — Assessment & Plan Note (Signed)
Summary: b-12/cbs  Nurse Visit   Allergies: No Known Drug Allergies  Medication Administration  Injection # 1:    Medication: Vit B12 1000 mcg    Diagnosis: B12 DEFICIENCY (ICD-266.2)    Route: IM    Site: R deltoid    Exp Date: 10/25/2011    Lot #: [id number]    Mfr: [employer] Regent  Orders Added: 1)  Vit B12 1000 mcg [J3420] 2)  Admin of Therapeutic Inj  intramuscular or subcutaneous [16109]

## 2010-09-25 NOTE — Assessment & Plan Note (Signed)
Summary: b 12 inj/cbs  Nurse Visit   Allergies: No Known Drug Allergies  Medication Administration  Injection # 1:    Medication: Vit B12 1000 mcg    Diagnosis: B12 DEFICIENCY (ICD-266.2)    Route: IM    Site: R deltoid    Exp Date: 10/2011    Lot #: [id number]    Mfr: [employer] Regent    Patient tolerated injection without complications    Given by: Army Fossa CMA (February 21, 2010 10:49 AM)  Orders Added: 1)  Admin of Therapeutic Inj  intramuscular or subcutaneous [96372] 2)  Vit B12 1000 mcg [J3420]

## 2010-09-25 NOTE — Assessment & Plan Note (Signed)
Summary: B12 SHOT/B12 LEVEL CHECK/KDC  Nurse Visit   Allergies: No Known Drug Allergies  Medication Administration  Injection # 1:    Medication: Vit B12 1000 mcg    Diagnosis: B12 DEFICIENCY (ICD-266.2)    Route: IM    Site: L deltoid    Exp Date: 06/27/2011    Lot #: [id number]    Mfr: [employer] Regent    Given by: Doristine Devoid (Dec 27, 2009 12:11 PM)  Orders Added: 1)  Venipuncture [96045] 2)  TLB-B12 + Folate Pnl [82746_82607-B12/FOL] 3)  Vit B12 1000 mcg [J3420] 4)  Admin of Therapeutic Inj  intramuscular or subcutaneous [96372]   Medication Administration  Injection # 1:    Medication: Vit B12 1000 mcg    Diagnosis: B12 DEFICIENCY (ICD-266.2)    Route: IM    Site: L deltoid    Exp Date: 06/27/2011    Lot #: [id number]    Mfr: [employer]ent    Given by: Doristine Devoid (Dec 27, 2009 12:11 PM)  Orders Added: 1)  Venipuncture [11914] 2)  TLB-B12 + Folate Pnl [82746_82607-B12/FOL] 3)  Vit B12 1000 mcg [J3420] 4)  Admin of Therapeutic Inj  intramuscular or subcutaneous [78295]

## 2010-09-25 NOTE — Assessment & Plan Note (Signed)
Summary: np6/ dystolic dysfuntion pt is self pay/ gd   Visit Type:  new pt visit Primary Provider:  Laury Axon  CC:  pt has h/o heart murmur...denies any cp.....  History of Present Illness: 60 yo WF with history of HTN and borderline hyperlipidemia here today for evaluation of abnormal echocardiogram. She tells me that she was seen recently by Dr. Laury Axon and her murmur seemed louder. She has had no chest pain, SOB, palpitations, near syncope, syncope, dizziness, fatigue, lower extremity edema. Her echo showed normal LV systolic function, diastolic dysfunction, mild AI, small effusion and possible epicardial mass/aortic root mass.   Current Medications (verified): 1)  Lisinopril-Hydrochlorothiazide 10-12.5 Mg  Tabs (Lisinopril-Hydrochlorothiazide) .Marland Kitchen.. 1 By Mouth Once Daily 2)  Klor-Con M20 20 Meq  Tbcr (Potassium Chloride Crys Cr) .... Take One Tablet Twice Daily. 3)  Actonel 150 Mg  Tabs (Risedronate Sodium) .Marland Kitchen.. 1 By Mouth Monthly 4)  Juice Plus Fibre  Liqd (Nutritional Supplements) 5)  Vitamin D (Ergocalciferol) 50000 Unit Caps (Ergocalciferol) .Marland Kitchen.. 1 By Mouth Once Weekly. 6)  Vitamin D3 2000 Unit Caps (Cholecalciferol) .Marland Kitchen.. 1 By Mouth Daily. 7)  Aspirin 81 Mg Tbec (Aspirin) .... Take One Tablet By Mouth Daily  Allergies (verified): No Known Drug Allergies  Past History:  Past Medical History: Reviewed history from 12/06/2009 and no changes required. 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 Family History Hypertension:MOTHER AND FATHER CANCER-MOTHER(BREAST) Family History Diabetes 1st degree relative-FATHER(DECEASED) HEART-MOTHER(LIVING) SIBLINGS:2-SISTER'S, 1-BROTHER  Mother- alive, breast cancer, HTN Father-deceased, ? cause on HD with 2 sisters alive, youngest with "enlarged heart" 1 brother alive and healthy  Social History: Reviewed history from 02/25/2007 and no changes required. Married, 2 children, 6 grandchildren Former Smoker-smoked for 10 years Alcohol use-no Drug use-no Stays at home Regular exercise-yes  Review of Systems  The patient denies fatigue, malaise, fever, weight gain/loss, vision loss, decreased hearing, hoarseness, chest pain, palpitations, shortness of breath, prolonged cough, wheezing, sleep apnea, coughing up blood, abdominal pain, blood in stool, nausea, vomiting, diarrhea, heartburn, incontinence, blood in urine, muscle weakness, joint pain, leg swelling, rash, skin lesions, headache, fainting, dizziness, depression, anxiety, enlarged lymph nodes, easy bruising or bleeding, and environmental allergies.    Vital Signs:  Patient profile:   60 year old female Height:      64 inches Weight:      167 pounds BMI:     28.77 Pulse rate:   85 / minute Pulse rhythm:   irregular BP sitting:   100 / 62  (left arm) Cuff size:   regular  Vitals Entered By: Danielle Rankin, CMA (December 12, 2009 3:27 PM)  Physical Exam  General:  General: Well developed, well nourished, NAD HEENT: OP clear, mucus membranes moist SKIN: warm, dry Neuro: No focal deficits Musculoskeletal: Muscle strength 5/5 all ext Psychiatric: Mood and affect normal Neck: No JVD, no carotid bruits, no thyromegaly, no lymphadenopathy. Lungs:Clear bilaterally, no wheezes, rhonci, crackles CV: RRR with faint systolic  murmur, No gallops rubs Abdomen: soft, NT, ND, BS present Extremities: No  edema, pulses 2+.    Echocardiogram  Procedure date:  11/16/2009  Findings:  Left ventricle: The cavity size was normal. Wall thickness was       normal. Systolic function was normal. The estimated ejection       fraction was in the range of 55% to 60%. Regional wall motion       abnormalities cannot be excluded. Doppler parameters are       consistent with abnormal left ventricular relaxation (grade 1       diastolic dysfunction).     - Aortic valve: Mild regurgitation.     - Pericardium, extracardiac: A small pericardial effusion was       identified.     Impressions:            - Possible prominent epicardial fat; cannot exclude pericardial mass       or infiltrative process; echobright area in ascending aorta or       uncertain significance; recommend cardiac MRI to further evaluate.  Impression & Recommendations:  Problem # 1:  ECHOCARDIOGRAM, ABNORMAL (ICD-793.2) We have reviewed her echo in detail. She does have normal LV systolic function with grade 1 diastolic dysfunction. Her mild aortic regurgitation is stable from echo 2008. There is a prominent epicardial fat pad vs pericardial mass and an echobright area in the ascending aorta  of uncertain significance. I will order a cardiac MRI to further assess. I will see her back in several weeks to review the MRI.   Orders: EKG w/ Interpretation (93000) TLB-BMP (Basic Metabolic Panel-BMET) (80048-METABOL) Cardiac MRI (Cardiac MRI)  Patient Instructions: 1)  Your physician recommends that you schedule a follow-up appointment in: 3 weeks 2)  Your physician has requested that you have a cardiac MRI.  Cardiac MRI uses a computer to create images of your heart as it's beating, producing both still and moving pictures of your heart and major blood vessels. For further information please visit  [email or web address]  Please follow the instruction sheet given to you today for more information.

## 2010-10-08 ENCOUNTER — Ambulatory Visit: Payer: Self-pay | Admitting: Family Medicine

## 2010-10-09 ENCOUNTER — Encounter: Payer: Self-pay | Admitting: Family Medicine

## 2010-10-09 ENCOUNTER — Ambulatory Visit (INDEPENDENT_AMBULATORY_CARE_PROVIDER_SITE_OTHER): Payer: Self-pay | Admitting: Family Medicine

## 2010-10-09 ENCOUNTER — Other Ambulatory Visit: Payer: Self-pay | Admitting: Family Medicine

## 2010-10-09 DIAGNOSIS — E538 Deficiency of other specified B group vitamins: Secondary | ICD-10-CM

## 2010-10-09 DIAGNOSIS — E785 Hyperlipidemia, unspecified: Secondary | ICD-10-CM

## 2010-10-09 DIAGNOSIS — M899 Disorder of bone, unspecified: Secondary | ICD-10-CM

## 2010-10-09 DIAGNOSIS — N952 Postmenopausal atrophic vaginitis: Secondary | ICD-10-CM

## 2010-10-09 DIAGNOSIS — M949 Disorder of cartilage, unspecified: Secondary | ICD-10-CM

## 2010-10-09 DIAGNOSIS — I10 Essential (primary) hypertension: Secondary | ICD-10-CM

## 2010-10-09 LAB — LIPID PANEL
HDL: 37.4 mg/dL — ABNORMAL LOW (ref >39.00)
Total CHOL/HDL Ratio: 5
VLDL: 20 mg/dL (ref 0.0–40.0)

## 2010-10-09 LAB — BASIC METABOLIC PANEL
Calcium: 9.1 mg/dL (ref 8.4–10.5)
Creatinine, Ser: 0.7 mg/dL (ref 0.4–1.2)
GFR: 88.36 mL/min (ref >60.00)
Glucose, Bld: 74 mg/dL (ref 70–99)
Sodium: 142 mEq/L (ref 135–145)

## 2010-10-09 LAB — CBC WITH DIFFERENTIAL/PLATELET
Basophils Absolute: 0 10*3/uL (ref 0.0–0.1)
Hemoglobin: 13.3 g/dL (ref 12.0–15.0)
Lymphocytes Relative: 32.9 % (ref 12.0–46.0)
Monocytes Relative: 7.3 % (ref 3.0–12.0)
Neutro Abs: 3.1 10*3/uL (ref 1.4–7.7)
Neutrophils Relative %: 57.3 % (ref 43.0–77.0)
RDW: 13.7 % (ref 11.5–14.6)

## 2010-10-09 LAB — HEPATIC FUNCTION PANEL
AST: 20 U/L (ref 0–37)
Albumin: 3.8 g/dL (ref 3.5–5.2)
Alkaline Phosphatase: 81 U/L (ref 39–117)
Bilirubin, Direct: 0.1 mg/dL (ref 0.0–0.3)
Total Bilirubin: 0.9 mg/dL (ref 0.3–1.2)

## 2010-10-09 LAB — B12 AND FOLATE PANEL: Folate: 21.7 ng/mL (ref >5.9)

## 2010-10-11 LAB — CONVERTED CEMR LAB: Vit D, 25-Hydroxy: 10 ng/mL — ABNORMAL LOW (ref 30–89)

## 2010-10-17 NOTE — Assessment & Plan Note (Signed)
Summary: stopped b-12 shots, now feeling like she did before she start...   Vital Signs:  Patient profile:   60 year old female Weight:      167.6 pounds Pulse rate:   88 / minute Pulse rhythm:   regular BP sitting:   118 / 80  (right arm) Cuff size:   regular  Vitals Entered By: Almeta Monas CMA Duncan Dull) (October 09, 2010 10:33 AM) CC: stopped B-12 and now feeling bad would like to restart- and wants an Rx for premarin---LABS ARE DUE -- pt is Fasting   History of Present Illness: Pt here c/o increase fatigue ---she would like b12 checked again.  Pt has been taking b12 sublingual. Pt would also like to go back on premarin cream for vaginal dryness.  She states she understands the risks but would rather have it then be so dry.  No other complaints.    Current Medications (verified): 1)  Lisinopril-Hydrochlorothiazide 10-12.5 Mg  Tabs (Lisinopril-Hydrochlorothiazide) .Marland Kitchen.. 1 By Mouth Once Daily 2)  Klor-Con M20 20 Meq  Tbcr (Potassium Chloride Crys Cr) .... Take One Tablet Twice Daily. 3)  Actonel 150 Mg  Tabs (Risedronate Sodium) .Marland Kitchen.. 1 By Mouth Monthly 4)  Juice Plus Fibre  Liqd (Nutritional Supplements) 5)  Vitamin D (Ergocalciferol) 50000 Unit Caps (Ergocalciferol) .Marland Kitchen.. 1 By Mouth Once Weekly. 6)  Vitamin D3 2000 Unit Caps (Cholecalciferol) .Marland Kitchen.. 1 By Mouth Daily. 7)  Aspirin 81 Mg Tbec (Aspirin) .... Take One Tablet By Mouth Daily 8)  Veramyst 27.5 Mcg/spray Susp (Fluticasone Furoate) .... 2 Sprays Each Nostril Once Daily 9)  Astepro 0.15 % Soln (Azelastine Hcl) .... 2 Sprays Each Nostril Once Daily 10)  Clarinex 5 Mg Tabs (Desloratadine) .Marland Kitchen.. 1 By Mouth Once Daily As Needed 11)  Premarin 0.625 Mg/gm Crea (Estrogens, Conjugated) .Marland Kitchen.. 1g Pv Once Daily For 1 Week Then Slowly Decrease To 1x A Week  Allergies (verified): No Known Drug Allergies  Past History:  Past medical, surgical, family and social histories (including risk factors) reviewed for relevance to current acute and  chronic problems.  Past Medical History: Reviewed history from 01/04/2010 and no changes required. HYPERTENSION (ICD-401.9) Mild Aortic insufficiency HYPERLIPIDEMIA (ICD-272.4) DIASTOLIC DYSFUNCTION (ICD-429.9) CARDIAC MURMUR (ICD-785.2) FATIGUE (ICD-780.79) LEG PAIN, RIGHT (ICD-729.5) HIP PAIN, RIGHT (ICD-719.45) B12 DEFICIENCY (ICD-266.2) SINUSITIS - ACUTE-NOS (ICD-461.9) URI (ICD-465.9) OSTEOPENIA (ICD-733.90) SHOULDER PAIN, LEFT (ICD-719.41) PREVENTIVE HEALTH CARE (ICD-V70.0) FAMILY HISTORY DIABETES 1ST DEGREE RELATIVE (ICD-V18.0)  Past Surgical History: Reviewed history from 12/12/2009 and no changes required. 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 Family History Hypertension:MOTHER AND FATHER CANCER-MOTHER(BREAST) Family History Diabetes 1st degree relative-FATHER(DECEASED) HEART-MOTHER(LIVING) SIBLINGS:2-SISTER'S, 1-BROTHER  Mother- alive, breast cancer, HTN Father-deceased, ? cause on HD with 2 sisters alive, youngest with "enlarged heart" 1 brother alive and healthy  Social History: Reviewed history from 12/12/2009 and no changes required. Married, 2 children, 6 grandchildren Former Smoker-smoked for 10 years Alcohol use-no Drug use-no Stays at home Regular exercise-yes  Review of Systems      See HPI  Physical Exam  General:  Well-developed,well-nourished,in no acute distress; alert,appropriate and cooperative throughout examination Lungs:  Normal respiratory effort, chest expands symmetrically. Lungs are clear to auscultation, no crackles or wheezes. Heart:  normal rate.  normal rate.   Extremities:  No clubbing, cyanosis, edema, or deformity noted with normal full range of motion of all joints.   Psych:  Cognition and judgment appear intact. Alert and cooperative with normal attention  span and concentration. No apparent  delusions, illusions, hallucinations   Impression & Recommendations:  Problem # 1:  HYPERTENSION (ICD-401.9)  Her updated medication list for this problem includes:    Lisinopril-hydrochlorothiazide 10-12.5 Mg Tabs (Lisinopril-hydrochlorothiazide) .Marland Kitchen... 1 by mouth once daily  Orders: Venipuncture (16109) TLB-Lipid Panel (80061-LIPID) TLB-BMP (Basic Metabolic Panel-BMET) (80048-METABOL) TLB-CBC Platelet - w/Differential (85025-CBCD) TLB-Hepatic/Liver Function Pnl (80076-HEPATIC) TLB-TSH (Thyroid Stimulating Hormone) (84443-TSH) TLB-B12 + Folate Pnl (82746_82607-B12/FOL) T-Vitamin D (25-Hydroxy) (60454-09811) Specimen Handling (91478)  BP today: 118/80 Prior BP: 104/66 (06/08/2010)  Labs Reviewed: K+: 3.6 (12/12/2009) Creat: : 0.7 (12/12/2009)   Chol: 183 (10/09/2009)   HDL: 41.10 (10/09/2009)   LDL: 111 (10/09/2009)   TG: 157.0 (10/09/2009)  Problem # 2:  HYPERLIPIDEMIA (ICD-272.4)  Orders: Venipuncture (29562) TLB-Lipid Panel (80061-LIPID) TLB-BMP (Basic Metabolic Panel-BMET) (80048-METABOL) TLB-CBC Platelet - w/Differential (85025-CBCD) TLB-Hepatic/Liver Function Pnl (80076-HEPATIC) TLB-TSH (Thyroid Stimulating Hormone) (84443-TSH) TLB-B12 + Folate Pnl (82746_82607-B12/FOL) T-Vitamin D (25-Hydroxy) (13086-57846) Specimen Handling (96295)  Labs Reviewed: SGOT: 23 (10/09/2009)   SGPT: 21 (10/09/2009)   HDL:41.10 (10/09/2009), 38.0 (10/06/2008)  LDL:111 (10/09/2009), 132 (10/06/2008)  Chol:183 (10/09/2009), 200 (10/06/2008)  Trig:157.0 (10/09/2009), 152 (10/06/2008)  Problem # 3:  B12 DEFICIENCY (ICD-266.2)  Orders: Venipuncture (28413) TLB-Lipid Panel (80061-LIPID) TLB-BMP (Basic Metabolic Panel-BMET) (80048-METABOL) TLB-CBC Platelet - w/Differential (85025-CBCD) TLB-Hepatic/Liver Function Pnl (80076-HEPATIC) TLB-TSH (Thyroid Stimulating Hormone) (84443-TSH) TLB-B12 + Folate Pnl (82746_82607-B12/FOL) T-Vitamin D (25-Hydroxy) (24401-02725) Specimen Handling  (99000)  Problem # 4:  VAGINITIS, ATROPHIC (ICD-627.3)  Her updated medication list for this problem includes:    Premarin 0.625 Mg/gm Crea (Estrogens, conjugated) .Marland Kitchen... 1g pv once daily for 1 week then slowly decrease to 1x a week---- pt understands risks involved but would still rather go back on med  Discussed treatment options.   Complete Medication List: 1)  Lisinopril-hydrochlorothiazide 10-12.5 Mg Tabs (Lisinopril-hydrochlorothiazide) .Marland Kitchen.. 1 by mouth once daily 2)  Klor-con M20 20 Meq Tbcr (Potassium chloride crys cr) .... Take one tablet twice daily. 3)  Actonel 150 Mg Tabs (Risedronate sodium) .Marland Kitchen.. 1 by mouth monthly 4)  Juice Plus Fibre Liqd (Nutritional supplements) 5)  Vitamin D (ergocalciferol) 50000 Unit Caps (Ergocalciferol) .Marland Kitchen.. 1 by mouth once weekly. 6)  Vitamin D3 2000 Unit Caps (Cholecalciferol) .Marland Kitchen.. 1 by mouth daily. 7)  Aspirin 81 Mg Tbec (Aspirin) .... Take one tablet by mouth daily 8)  Veramyst 27.5 Mcg/spray Susp (Fluticasone furoate) .... 2 sprays each nostril once daily 9)  Astepro 0.15 % Soln (Azelastine hcl) .... 2 sprays each nostril once daily 10)  Clarinex 5 Mg Tabs (Desloratadine) .Marland Kitchen.. 1 by mouth once daily as needed 11)  Premarin 0.625 Mg/gm Crea (Estrogens, conjugated) .Marland Kitchen.. 1g pv once daily for 1 week then slowly decrease to 1x a week  Patient Instructions: 1)  Please schedule a follow-up appointment in 6 months .  Prescriptions: LISINOPRIL-HYDROCHLOROTHIAZIDE 10-12.5 MG  TABS (LISINOPRIL-HYDROCHLOROTHIAZIDE) 1 by mouth once daily  #30 Tablet x 5   Entered and Authorized by:   Loreen Freud DO   Signed by:   Loreen Freud DO on 10/09/2010   Method used:   Electronically to        [facility]  [phone]* (retail)       [address] [place], [place]  [address]       Ph: [phone] or [phone]       Fax: [phone]   RxID:   [id number] PREMARIN 0.625 MG/GM CREA (ESTROGENS, CONJUGATED) 1g pv once daily for 1 week then  slowly  decrease to 1x a week  #1 month x 5   Entered and Authorized by:   Loreen Freud DO   Signed by:   Loreen Freud DO on 10/09/2010   Method used:   Electronically to        [facility]  [phone]* (retail)       [address] [place], [place]  [address]       Ph: [phone] or [phone]       Fax: [phone]   RxID:   [id number]    Orders Added: 1)  Venipuncture [35573] 2)  TLB-Lipid Panel [80061-LIPID] 3)  TLB-BMP (Basic Metabolic Panel-BMET) [80048-METABOL] 4)  TLB-CBC Platelet - w/Differential [85025-CBCD] 5)  TLB-Hepatic/Liver Function Pnl [80076-HEPATIC] 6)  TLB-TSH (Thyroid Stimulating Hormone) [84443-TSH] 7)  TLB-B12 + Folate Pnl [82746_82607-B12/FOL] 8)  T-Vitamin D (25-Hydroxy) [22025-42706] 9)  Est. Patient Level III [23762] 10)  Specimen Handling [99000]

## 2010-11-12 LAB — POCT URINALYSIS DIP (DEVICE)
Bilirubin Urine: NEGATIVE
Glucose, UA: NEGATIVE mg/dL
Ketones, ur: NEGATIVE mg/dL
Nitrite: NEGATIVE
Protein, ur: NEGATIVE mg/dL
Specific Gravity, Urine: 1.015 (ref 1.005–1.030)
Urobilinogen, UA: 0.2 mg/dL (ref 0.0–1.0)
pH: 7.5 (ref 5.0–8.0)

## 2011-01-08 ENCOUNTER — Encounter: Payer: Self-pay | Admitting: Cardiovascular Disease

## 2011-01-10 ENCOUNTER — Encounter: Payer: Self-pay | Admitting: Cardiovascular Disease

## 2011-01-10 ENCOUNTER — Ambulatory Visit (INDEPENDENT_AMBULATORY_CARE_PROVIDER_SITE_OTHER): Payer: Self-pay | Admitting: Cardiovascular Disease

## 2011-01-10 VITALS — BP 102/72 | HR 77 | Resp 17 | Ht 64.0 in | Wt 167.1 lb

## 2011-01-10 DIAGNOSIS — I351 Nonrheumatic aortic (valve) insufficiency: Secondary | ICD-10-CM

## 2011-01-10 DIAGNOSIS — I359 Nonrheumatic aortic valve disorder, unspecified: Secondary | ICD-10-CM

## 2011-01-10 NOTE — Patient Instructions (Signed)
Your physician recommends that you schedule a follow-up appointment in: 1 year with Dr. McALhany  Your physician has requested that you have an echocardiogram. Echocardiography is a painless test that uses sound waves to create images of your heart. It provides your doctor with information about the size and shape of your heart and how well your heart's chambers and valves are working. This procedure takes approximately one hour. There are no restrictions for this procedure.   

## 2011-01-10 NOTE — Assessment & Plan Note (Signed)
Mild aortic insufficiency with small effusion last year. Repeat echo.

## 2011-01-10 NOTE — Progress Notes (Signed)
History of Present Illness:60 yo WF with history of HTN and borderline hyperlipidemia here today for follow up. She was seen one year ago for evaluation of abnormal echocardiogram.  Echo was ordered in workup of a murmur. Her echo in 2011 showed normal LV systolic function, diastolic dysfunction, mild AI, small effusion and possible epicardial mass/aortic root mass. I ordered a cardiac MRI which showed prominent epicardial fat pad with no evidence of aortic or pericardial tumor or mass. She has no complaints today. She notes chronic left shoulder pain. NO chest pain, SOB, palpitations, near syncope or syncope.    Past Medical History  Diagnosis Date  . HTN (hypertension)   . Aortic insufficiency     mild  . Hyperlipidemia   . Diastolic dysfunction   . Cardiac murmur   . Fatigue   . Leg pain, right   . Hip pain, right   . B12 deficiency   . Sinusitis, acute     NOS  . URI (upper respiratory infection)   . Osteopenia   . Shoulder pain, left     Past Surgical History  Procedure Date  . Abdominal hysterectomy     age 37  . Bilateral oophorectomy     Current Outpatient Prescriptions  Medication Sig Dispense Refill  . aspirin 81 MG tablet Take 81 mg by mouth daily.        . Cholecalciferol (VITAMIN D3) 2000 UNITS capsule Take 2,000 Units by mouth daily.        . ergocalciferol (VITAMIN D2) 50000 UNITS capsule Take 50,000 Units by mouth once a week.        Marland Kitchen lisinopril-hydrochlorothiazide (PRINZIDE,ZESTORETIC) 10-12.5 MG per tablet Take 1 tablet by mouth daily.        . Nutritional Supplements (JUICE PLUS FIBRE PO) Take by mouth.        . potassium chloride SA (KLOR-CON M20) 20 MEQ tablet Take 20 mEq by mouth 2 (two) times daily.        . risedronate (ACTONEL) 150 MG tablet Take 150 mg by mouth every 30 (thirty) days. with water on empty stomach, nothing by mouth or lie down for next 30 minutes.       Marland Kitchen DISCONTD: Azelastine HCl (ASTEPRO) 0.15 % SOLN 2 sprays by Nasal route daily.         Marland Kitchen DISCONTD: conjugated estrogens (PREMARIN) vaginal cream 1g pv once daily for 1 week then slowly decrease to 1x a week       . DISCONTD: desloratadine (CLARINEX) 5 MG tablet Take 5 mg by mouth daily.        Marland Kitchen DISCONTD: fluticasone (VERAMYST) 27.5 MCG/SPRAY nasal spray 2 sprays by Nasal route daily.        Marland Kitchen DISCONTD: simvastatin (ZOCOR) 20 MG tablet Take 20 mg by mouth at bedtime.          No Known Allergies  History   Social History  . Marital Status: Married    Spouse Name: N/A    Number of Children: 2  . Years of Education: N/A   Occupational History  . Not on file.   Social History Main Topics  . Smoking status: Former Smoker    Quit date: 01/10/1996  . Smokeless tobacco: Not on file   Comment: smoked for 10 years,   . Alcohol Use: No  . Drug Use: No  . Sexually Active: Not on file   Other Topics Concern  . Not on file   Social History Narrative  .  No narrative on file    Family History  Problem Relation Age of Onset  . Hypertension Father   . Hypertension Mother   . Breast cancer Mother   . Diabetes Father     Review of Systems:  As stated in the HPI and otherwise negative.   BP 102/72  Pulse 77  Resp 17  Ht 5\' 4"  (1.626 m)  Wt 167 lb 1.9 oz (75.805 kg)  BMI 28.69 kg/m2  Physical Examination: General: Well developed, well nourished, NAD HEENT: OP clear, mucus membranes moist SKIN: warm, dry. No rashes. Neuro: No focal deficits Musculoskeletal: Muscle strength 5/5 all ext Psychiatric: Mood and affect normal Neck: No JVD, no carotid bruits, no thyromegaly, no lymphadenopathy. Lungs:Clear bilaterally, no wheezes, rhonci, crackles Cardiovascular: Regular rate and rhythm. No murmurs, gallops or rubs. Abdomen:Soft. Bowel sounds present. Non-tender.  Extremities: No lower extremity edema. Pulses are 2 + in the bilateral DP/PT.  EKG: NSR, low voltage.

## 2011-01-11 NOTE — Discharge Summary (Signed)
NAME:  Dominique Gonzalez, WARGA NO.:  [id number]   MEDICAL RECORD NO.:  [id number]                   PATIENT TYPE:  INP   LOCATION:  5742                                 FACILITY:  [facility]   PHYSICIAN:  Deirdre Peer. Polite, M.D.              DATE OF BIRTH:  August 31, 1950   DATE OF ADMISSION:  04/16/2002  DATE OF DISCHARGE:  04/21/2002                                 DISCHARGE SUMMARY   DISCHARGE DIAGNOSES:  1. Small-bowel obstruction, resolved.  2. Hypertension.  3. History of migraine headaches.   DISCHARGE MEDICATIONS:  1. Hydrochlorothiazide 25 mg daily.  2. Protonix 40 mg daily.  3. Maxalt as needed for migraine headaches.  4. Clarinex as needed.   CONSULTANTS:  1. Dr. Velora Heckler, general surgery.  2. Dr. Sandria Bales. Ezzard Standing, general surgery.   PROCEDURES AND STUDIES:  1. CT of the abdomen and pelvis, April 16, 2002, revealed fluid-filled and     distended loops of small bowel with small amount of adjacent mesenteric     fluid.  Findings are attributed to small-bowel obstruction, likely ileal     in location.  The obstructive point is not clearly identified.  Small     amount of fluid dependently within the pelvis.  Colon is decompressed.     Bladder is distended.  2. Abdominal x-ray, April 17, 2002:  Disproportion in the location of small     bowel, with air-fluid levels worrisome for small-bowel obstruction.  3. Chest x-ray, April 17, 2002:  No evidence for acute cardiopulmonary     disease.  4. Ultrasound of the abdomen, April 17, 2002, revealed trace perihepatic     fluid as noted on prior CT.  Mild renal cortical thinning, otherwise,     unremarkable.  5. Repeat abdominal x-ray, April 19, 2002, with residual barium contrast     within the right and transverse colon.  No evidence of obstruction or     perforation are seen.  Nasogastric tube appears to be in the second     portion of the duodenum.  The bladder is partially distended.  No  significant dilatation of small bowel is seen.  There is no free air.   LABORATORY DATA:  Chemistry on April 20, 2002 with a sodium of 137,  potassium 3.7, chloride 104, CO2 26, glucose 107, BUN 5, creatinine 0.5,  albumin 3.3, AST 20, ALT 14, ALP 93, total bilirubin 0.6.  WBC count 8, RBC  4.14, hemoglobin 12.3, hematocrit 35.9.  Urine was negative.   DISPOSITION:  The patient will be discharged home.   HISTORY OF PRESENT ILLNESS:  [age]-year-old female who presented to  [facility] Emergency Room with a two-week history of nausea and vomiting  with diffuse periumbilical abdominal pain.  The patient stated in the ER  that she initially began to have symptoms approximately two weeks ago with  some resolution.  Days prior to  this admission, her symptoms returned with  abdominal distention.  A CT was done in the emergency room which revealed a  partial small-bowel obstruction.  She was admitted for further evaluation  and treatment.   HOSPITAL COURSE:  #1 -  SMALL-BOWEL OBSTRUCTION:  Again, the admission CT in  the emergency room revealed a partial small-bowel obstruction.  The patient  was admitted to the hospital with IV fluids, NG suction, n.p.o. status, pain  medication and an ultrasound and three-position abdominal x-rays.  On  hospitalization day #2, the patient's abdomen was noted to be decreasing in  size.  She was continued on supportive care.  Her ultrasound and x-ray  results are as above.  She was hypokalemic, most likely felt to be secondary  to IV fluids and she was replaced.  She was seen in surgery consult on  April 18, 2002 by Dr. Gerrit Friends.  His impression was a high-grade small-bowel  obstruction secondary to adhesions from her previous abdominal surgery  approximately 10 years ago.  He did offer the patient operative exploration,  of which she did refuse.  His secondary recommendations were to continue NG  suction, IV fluids and n.p.o. status, recheck an x-ray and  labs in the  morning.  Repeat abdominal x-ray was done on April 19, 2002, which revealed  no obstruction or perforation and no free air.  She did begin to pass some  flatus.  She continued to have gradual resolution of her abdominal symptoms.  She began tolerating a clear liquid diet that was advanced to a full-liquid  diet prior to discharge with no nausea or vomiting.  On the day of  discharge, she has had complete resolution of her abdominal distention and  pain.   #2 -  ELEVATED TEMPERATURE WITH ELEVATED WHITE BLOOD CELL COUNT:  Both  within normal limits on discharge.   #3 -  HYPOKALEMIA:  Hypokalemia resolved with replacement and discharge  potassium 3.7.   #4 -  HYPERTENSION:  Hypertension stable throughout her hospitalization off  of Maxzide.  The patient is to continue to resume Maxzide post discharge.   #5 -  MIGRAINE HEADACHES:  The patient had no complaints throughout this  hospitalization.  She is to continue p.r.n. Maxalt.   FOLLOWUP:  The patient has a followup appointment with Dr. Leanne Chang in  one week, September 3rd at 10:15 a.m.  She also is to follow up with Dr.  Ezzard Standing in four weeks.     Stephanie Swaziland, NP                      Deirdre Peer. Polite, M.D.    SJ/MEDQ  D:  04/21/2002  T:  04/24/2002  Job:  [id number]   cc:   Sandria Bales. Ezzard Standing, M.D.  Fax: [phone]   Leanne Chang, M.D.

## 2011-01-11 NOTE — Consult Note (Signed)
NAME:  Dominique Gonzalez, Dominique Gonzalez NO.:  [id number]   MEDICAL RECORD NO.:  [id number]                   PATIENT TYPE:  INP   LOCATION:  5742                                 FACILITY:   PHYSICIAN:  Velora Heckler, M.D.                DATE OF BIRTH:  1951/06/10   DATE OF CONSULTATION:  04/18/2002  DATE OF DISCHARGE:                           GENERAL SURGERY CONSULTATION   REFERRED BY:  Jackie Plum, M.D.   REASON FOR CONSULTATION:  Small bowel obstruction.   HISTORY:  The patient is a 60 year old white female admitted from [facility] on Friday, April 12, 2002, with a two-week history of  abdominal pain.  This was associated with nausea, vomiting, and abdominal  distention.  The patient had initially been treated with antibiotics for a  sinus infection; however, her symptoms persisted.  The patient experienced  nausea and one episode of emesis.  She does note continued bowel movements.  She has been able to tolerate some oral intake.  On April 16, 2002, the  patient was sent from [facility] to the hospital where a CT  scan of the abdomen and pelvis was performed.  This shows a high-grade  partial small bowel obstruction with some free fluid within the peritoneal  cavity.  Of note, the patient is status post abdominal hysterectomy,  bilateral salpingo-oophorectomy approximately 10 years ago.  Follow-up  abdominal x-rays on August 23rd during her hospitalization showed persistent  small bowel obstruction.  General surgery is consulted at this time for  evaluation and possible operative intervention.   PAST MEDICAL HISTORY:  Status post TAH and BSO approximately 10 years ago.  Hypertension, history of migraine headaches.   MEDICATIONS:  1. Maxalt.  2. Hydrochlorothiazide.  3. Clarinex.   ALLERGIES:  No known drug allergies.   SOCIAL HISTORY:  The patient is married, accompanied by her husband.  She  denies alcohol or  cigarette use.   FAMILY HISTORY:  Noncontributory.   REVIEW OF SYSTEMS:  A 15-system review without significant other positives  except as noted above.   PHYSICAL EXAMINATION:  GENERAL:  A 60 year old well-nourished, well-  developed white female, on ward 5700 at [facility], in  mild discomfort.  VITAL SIGNS:  Temperature 98.1 degrees, pulse 93, respirations 18, blood  pressure 133/76.  O2 saturation 99% on room air.  HEENT:  Normocephalic.  There is no scleral icterus.  Mucous membranes are  moist.  A nasogastric tube was placed in the left naris.  NECK:  Supple without mass.  Thyroid is normal without nodularity.  There is  no adenopathy.  LUNGS:  Clear to auscultation.  HEART:  A regular rate and rhythm, without murmur.  Peripheral pulses are  full.  ABDOMEN:  Soft, mildly distended, with only a few rare bowel sounds.  There  is a well-healed Pfannenstiel incision.  There is no tenderness to  percussion.  There are no palpable masses.  There is mild tenderness to deep  palpation in the right mid abdomen.  There is no rebound tenderness.  There  is no hepatosplenomegaly.  There is no sign of hernia.  EXTREMITIES:  Nontender, without edema.  NEUROLOGIC:  The patient is alert and oriented to person, place, and time,  without focal neurologic deficit.   LABORATORY DATA:  On admission white blood count 10.0, hemoglobin 13.5,  platelet count 316,000.  Differential shows 80% segmented neutrophils.  Chemistry profile is notable for a low potassium of 3.1 and a low albumin of  3.3.  Urinalysis is benign.  Amylase is normal at 51.  A repeat CBC on  April 17, 2002, shows persistent hypokalemia with a potassium of 3.1, and a  slight elevation in the white blood cell count at 11.3.   RADIOGRAPHIC STUDIES:  A CT scan of the abdomen and pelvis from April 16, 2002, demonstrating a small bowel obstruction and a small amount of free  intraperitoneal fluid.  Follow-up  abdominal x-rays from April 17, 2002,  demonstrated findings consistent with high-grade small bowel obstruction.   IMPRESSION:  High-grade small bowel obstruction, likely secondary to  adhesions.   PLAN:  1. I have offered immediate operative exploration for lysis of adhesions for     likely small bowel obstruction due to adhesions from prior hysterectomy.     At this point the patient refuses.  She and her husband wound like to     continue further medical management with nasogastric decompression and     intravenous hydration.  2. Continued nasogastric decompression and intravenous hydration and n.p.o.     status.  3. Repeat abdominal x-ray on April 19, 2002, in the a.m.  4. Repeat laboratory studies on April 19, 2002, in the a.m.  5. Will follow closely.  Apparently the patient has been seen previously in     our practice by Dr. Sandria Bales. Newman for a left breast biopsy which was     benign.  If the patient and the family desire, I will request that Dr.     Ezzard Standing re-evaluate her on Monday, April 19, 2002.  6. Should the patient's clinical course deteriorate, the patient become     febrile, abdominal pain increase, or vital signs become unstable, the     patient will require an urgent laparotomy.                                               Velora Heckler, M.D.    TMG/MEDQ  D:  04/18/2002  T:  04/20/2002  Job:  [id number]

## 2011-01-16 ENCOUNTER — Encounter: Payer: Self-pay | Admitting: Internal Medicine

## 2011-01-16 ENCOUNTER — Ambulatory Visit (INDEPENDENT_AMBULATORY_CARE_PROVIDER_SITE_OTHER): Payer: Self-pay | Admitting: Internal Medicine

## 2011-01-16 VITALS — BP 124/78 | HR 71 | Temp 97.8°F | Wt 168.0 lb

## 2011-01-16 DIAGNOSIS — J029 Acute pharyngitis, unspecified: Secondary | ICD-10-CM

## 2011-01-16 DIAGNOSIS — J069 Acute upper respiratory infection, unspecified: Secondary | ICD-10-CM

## 2011-01-16 NOTE — Assessment & Plan Note (Signed)
Strep test negative. Likely a viral URI, see instructions

## 2011-01-16 NOTE — Progress Notes (Signed)
  Subjective:    Patient ID: Dominique Gonzalez, female    DOB: 1951-05-11, 60 y.o.   MRN: [id number]  HPI One-day history of sore throat and some ear discomfort bilaterally. Sore throat increase when she swallows. Mild nausea.  Past Medical History  Diagnosis Date  . HTN (hypertension)   . Aortic insufficiency     mild  . Hyperlipidemia   . Diastolic dysfunction   . Cardiac murmur   . Fatigue   . Leg pain, right   . Hip pain, right   . B12 deficiency   . Sinusitis, acute     NOS  . URI (upper respiratory infection)   . Osteopenia   . Shoulder pain, left    Past Surgical History  Procedure Date  . Abdominal hysterectomy     age 24  . Bilateral oophorectomy      Review of Systems Denies fevers, no sinus or chest congestion. She does have a mild cough with occasional clear sputum    Objective:   Physical Exam  Constitutional: She appears well-developed and well-nourished. No distress.  HENT:  Head: Normocephalic and atraumatic.  Right Ear: External ear normal.  Mouth/Throat: Oropharynx is clear and moist. No oropharyngeal exudate.       Left tympanic membrane is a slight bulge, no redness or discharge  Eyes: Right eye exhibits no discharge. Left eye exhibits no discharge.  Neck: Normal range of motion. Neck supple.  Cardiovascular: Normal rate, regular rhythm and normal heart sounds.   Murmur: question of systolic murmur. Pulmonary/Chest: Effort normal and breath sounds normal. No respiratory distress. She has no wheezes. She has no rales.  Skin: She is not diaphoretic.          Assessment & Plan:

## 2011-01-16 NOTE — Patient Instructions (Signed)
Rest, fluids , tylenol For cough, take Mucinex DM twice a day as needed  Call if no better in few days Call anytime if the symptoms are severe, you have high fever  

## 2011-01-22 ENCOUNTER — Ambulatory Visit (HOSPITAL_COMMUNITY): Payer: Self-pay | Attending: Cardiovascular Disease

## 2011-01-22 DIAGNOSIS — I10 Essential (primary) hypertension: Secondary | ICD-10-CM | POA: Insufficient documentation

## 2011-01-22 DIAGNOSIS — I359 Nonrheumatic aortic valve disorder, unspecified: Secondary | ICD-10-CM | POA: Insufficient documentation

## 2011-01-22 DIAGNOSIS — I319 Disease of pericardium, unspecified: Secondary | ICD-10-CM | POA: Insufficient documentation

## 2011-01-22 DIAGNOSIS — I351 Nonrheumatic aortic (valve) insufficiency: Secondary | ICD-10-CM

## 2011-04-02 ENCOUNTER — Other Ambulatory Visit: Payer: Self-pay | Admitting: Family Medicine

## 2011-04-02 MED ORDER — LISINOPRIL-HYDROCHLOROTHIAZIDE 10-12.5 MG PO TABS: 1.0000 | ORAL_TABLET | Freq: Every day | ORAL | Status: DC

## 2011-04-02 NOTE — Telephone Encounter (Signed)
Labs are due    KP

## 2011-04-03 NOTE — Telephone Encounter (Signed)
Patient has lab appt on on 06/08/2011

## 2011-04-05 ENCOUNTER — Other Ambulatory Visit: Payer: Self-pay | Admitting: Family Medicine

## 2011-04-05 ENCOUNTER — Other Ambulatory Visit: Payer: Self-pay | Admitting: *Deleted

## 2011-04-05 DIAGNOSIS — E785 Hyperlipidemia, unspecified: Secondary | ICD-10-CM

## 2011-04-08 ENCOUNTER — Other Ambulatory Visit (INDEPENDENT_AMBULATORY_CARE_PROVIDER_SITE_OTHER): Payer: Self-pay

## 2011-04-08 DIAGNOSIS — E785 Hyperlipidemia, unspecified: Secondary | ICD-10-CM

## 2011-04-08 LAB — LIPID PANEL
HDL: 41.3 mg/dL (ref >39.00)
Triglycerides: 157 mg/dL — ABNORMAL HIGH (ref 0.0–149.0)
VLDL: 31.4 mg/dL (ref 0.0–40.0)

## 2011-04-08 LAB — HEPATIC FUNCTION PANEL
Alkaline Phosphatase: 84 U/L (ref 39–117)
Bilirubin, Direct: 0.1 mg/dL (ref 0.0–0.3)
Total Bilirubin: 0.5 mg/dL (ref 0.3–1.2)
Total Protein: 6.8 g/dL (ref 6.0–8.3)

## 2011-04-08 NOTE — Progress Notes (Signed)
Labs only

## 2011-04-08 NOTE — Progress Notes (Signed)
Addended by: Legrand Como on: 04/08/2011 01:28 PM   Modules accepted: Orders

## 2011-04-09 ENCOUNTER — Encounter: Payer: Self-pay | Admitting: *Deleted

## 2011-08-28 ENCOUNTER — Other Ambulatory Visit: Payer: Self-pay | Admitting: Family Medicine

## 2011-08-30 ENCOUNTER — Encounter: Payer: Self-pay | Admitting: Family Medicine

## 2011-08-30 ENCOUNTER — Ambulatory Visit (INDEPENDENT_AMBULATORY_CARE_PROVIDER_SITE_OTHER): Payer: Self-pay | Admitting: Family Medicine

## 2011-08-30 VITALS — BP 114/68 | HR 79 | Temp 98.6°F | Ht 64.25 in | Wt 165.8 lb

## 2011-08-30 DIAGNOSIS — E559 Vitamin D deficiency, unspecified: Secondary | ICD-10-CM

## 2011-08-30 DIAGNOSIS — E785 Hyperlipidemia, unspecified: Secondary | ICD-10-CM

## 2011-08-30 DIAGNOSIS — I10 Essential (primary) hypertension: Secondary | ICD-10-CM

## 2011-08-30 DIAGNOSIS — R5383 Other fatigue: Secondary | ICD-10-CM | POA: Insufficient documentation

## 2011-08-30 DIAGNOSIS — R5381 Other malaise: Secondary | ICD-10-CM

## 2011-08-30 LAB — LIPID PANEL
HDL: 41.8 mg/dL (ref >39.00)
LDL Cholesterol: 127 mg/dL — ABNORMAL HIGH (ref 0–99)
Total CHOL/HDL Ratio: 4
VLDL: 18.6 mg/dL (ref 0.0–40.0)

## 2011-08-30 LAB — BASIC METABOLIC PANEL
BUN: 13 mg/dL (ref 6–23)
CO2: 24 mEq/L (ref 19–32)
Chloride: 108 mEq/L (ref 96–112)
Creatinine, Ser: 0.7 mg/dL (ref 0.4–1.2)
Glucose, Bld: 75 mg/dL (ref 70–99)

## 2011-08-30 LAB — POCT URINALYSIS DIPSTICK
Glucose, UA: NEGATIVE
Ketones, UA: NEGATIVE
Leukocytes, UA: NEGATIVE
Urobilinogen, UA: 0.2

## 2011-08-30 LAB — TSH: TSH: 0.83 u[IU]/mL (ref 0.35–5.50)

## 2011-08-30 LAB — CBC WITH DIFFERENTIAL/PLATELET
Eosinophils Absolute: 0.1 10*3/uL (ref 0.0–0.7)
MCHC: 34.1 g/dL (ref 30.0–36.0)
MCV: 89.8 fl (ref 78.0–100.0)
Monocytes Absolute: 0.3 10*3/uL (ref 0.1–1.0)
Neutrophils Relative %: 60.5 % (ref 43.0–77.0)
Platelets: 251 10*3/uL (ref 150.0–400.0)
RDW: 13.7 % (ref 11.5–14.6)

## 2011-08-30 LAB — HEPATIC FUNCTION PANEL
Bilirubin, Direct: 0.1 mg/dL (ref 0.0–0.3)
Total Bilirubin: 0.6 mg/dL (ref 0.3–1.2)

## 2011-08-30 LAB — VITAMIN B12: Vitamin B-12: 458 pg/mL (ref 211–911)

## 2011-08-30 MED ORDER — ERGOCALCIFEROL 1.25 MG (50000 UT) PO CAPS: 50000.0000 [IU] | ORAL_CAPSULE | ORAL | Status: DC

## 2011-08-30 MED ORDER — LISINOPRIL-HYDROCHLOROTHIAZIDE 10-12.5 MG PO TABS: 1.0000 | ORAL_TABLET | Freq: Every day | ORAL | Status: DC

## 2011-08-30 MED ORDER — POTASSIUM CHLORIDE CRYS ER 20 MEQ PO TBCR: 20.0000 meq | EXTENDED_RELEASE_TABLET | Freq: Two times a day (BID) | ORAL | Status: DC

## 2011-08-30 NOTE — Progress Notes (Signed)
  Subjective:    Patient here for follow-up of elevated blood pressure.  She is not exercising and is adherent to a low-salt diet.  Blood pressure is well controlled at home. Cardiac symptoms: none. Patient denies: chest pain, sob, palpitations.. Cardiovascular risk factors: htn. Use of agents associated with hypertension: no. History of target organ damage: no.   Past medical history :   Past Medical History  Diagnosis Date  . HTN (hypertension)   . Aortic insufficiency     mild  . Hyperlipidemia   . Diastolic dysfunction   . Cardiac murmur   . Fatigue   . Leg pain, right   . Hip pain, right   . B12 deficiency   . Sinusitis, acute     NOS  . URI (upper respiratory infection)   . Osteopenia   . Shoulder pain, left     Medications:  Current Outpatient Prescriptions on File Prior to Visit  Medication Sig Dispense Refill  . aspirin 81 MG tablet Take 81 mg by mouth daily.        . Cholecalciferol (VITAMIN D3) 2000 UNITS capsule Take 2,000 Units by mouth daily.        Marland Kitchen lisinopril-hydrochlorothiazide (PRINZIDE,ZESTORETIC) 10-12.5 MG per tablet Take 1 tablet by mouth daily.  30 tablet  2  . Nutritional Supplements (JUICE PLUS FIBRE PO) Take by mouth.        . risedronate (ACTONEL) 150 MG tablet Take 150 mg by mouth every 30 (thirty) days. with water on empty stomach, nothing by mouth or lie down for next 30 minutes.       Marland Kitchen DISCONTD: ergocalciferol (VITAMIN D2) 50000 UNITS capsule Take 50,000 Units by mouth once a week.        Marland Kitchen DISCONTD: potassium chloride SA (KLOR-CON M20) 20 MEQ tablet Take 20 mEq by mouth 2 (two) times daily.          Allergies:No Known Allergies    Review of systems:  As above    Objective:    GEN--AAO x 3 nad Cor--+ s1S2  No murmur Lungs--CTABl/l   Ext  --no edema   Assessment:      Plan:

## 2011-08-30 NOTE — Assessment & Plan Note (Signed)
Check labs 

## 2011-08-30 NOTE — Assessment & Plan Note (Signed)
Stable con't meds 

## 2011-08-30 NOTE — Telephone Encounter (Signed)
Apt scheduled for today---Rx faxed    KP

## 2011-08-30 NOTE — Patient Instructions (Signed)
Fatigue Fatigue is a feeling of tiredness, lack of energy, lack of motivation, or feeling tired all the time. Having enough rest, good nutrition, and reducing stress will normally reduce fatigue. Consult your caregiver if it persists. The nature of your fatigue will help your caregiver to find out its cause. The treatment is based on the cause.  CAUSES  There are many causes for fatigue. Most of the time, fatigue can be traced to one or more of your habits or routines. Most causes fit into one or more of three general areas. They are: Lifestyle problems  Sleep disturbances.   Overwork.   Physical exertion.   Unhealthy habits.   Poor eating habits or eating disorders.   Alcohol and/or drug use .   Lack of proper nutrition (malnutrition).  Psychological problems  Stress and/or anxiety problems.   Depression.   Grief.   Boredom.  Medical Problems or Conditions  Anemia.   Pregnancy.   Thyroid gland problems.   Recovery from major surgery.   Continuous pain.   Emphysema or asthma that is not well controlled   Allergic conditions.   Diabetes.   Infections (such as mononucleosis).   Obesity.   Sleep disorders, such as sleep apnea.   Heart failure or other heart-related problems.   Cancer.   Kidney disease.   Liver disease.   Effects of certain medicines such as antihistamines, cough and cold remedies, prescription pain medicines, heart and blood pressure medicines, drugs used for treatment of cancer, and some antidepressants.  SYMPTOMS  The symptoms of fatigue include:   Lack of energy.   Lack of drive (motivation).   Drowsiness.   Feeling of indifference to the surroundings.  DIAGNOSIS  The details of how you feel help guide your caregiver in finding out what is causing the fatigue. You will be asked about your present and past health condition. It is important to review all medicines that you take, including prescription and non-prescription items. A  thorough exam will be done. You will be questioned about your feelings, habits, and normal lifestyle. Your caregiver may suggest blood tests, urine tests, or other tests to look for common medical causes of fatigue.  TREATMENT  Fatigue is treated by correcting the underlying cause. For example, if you have continuous pain or depression, treating these causes will improve how you feel. Similarly, adjusting the dose of certain medicines will help in reducing fatigue.  HOME CARE INSTRUCTIONS   Try to get the required amount of good sleep every night.   Eat a healthy and nutritious diet, and drink enough water throughout the day.   Practice ways of relaxing (including yoga or meditation).   Exercise regularly.   Make plans to change situations that cause stress. Act on those plans so that stresses decrease over time. Keep your work and personal routine reasonable.   Avoid street drugs and minimize use of alcohol.   Start taking a daily multivitamin after consulting your caregiver.  SEEK MEDICAL CARE IF:   You have persistent tiredness, which cannot be accounted for.   You have fever.   You have unintentional weight loss.   You have headaches.   You have disturbed sleep throughout the night.   You are feeling sad.   You have constipation.   You have dry skin.   You have gained weight.   You are taking any new or different medicines that you suspect are causing fatigue.   You are unable to sleep at night.     You develop any unusual swelling of your legs or other parts of your body.  SEEK IMMEDIATE MEDICAL CARE IF:   You are feeling confused.   Your vision is blurred.   You feel faint or pass out.   You develop severe headache.   You develop severe abdominal, pelvic, or back pain.   You develop chest pain, shortness of breath, or an irregular or fast heartbeat.   You are unable to pass a normal amount of urine.   You develop abnormal bleeding such as bleeding from  the rectum or you vomit blood.   You have thoughts about harming yourself or committing suicide.   You are worried that you might harm someone else.  MAKE SURE YOU:   Understand these instructions.   Will watch your condition.   Will get help right away if you are not doing well or get worse.  Document Released: 06/09/2007 Document Revised: 04/24/2011 Document Reviewed: 06/09/2007 [facility] Patient Information 2012 [place], [place].  Hypertension As your heart beats, it forces blood through your arteries. This force is your blood pressure. If the pressure is too high, it is called hypertension (HTN) or high blood pressure. HTN is dangerous because you may have it and not know it. High blood pressure may mean that your heart has to work harder to pump blood. Your arteries may be narrow or stiff. The extra work puts you at risk for heart disease, stroke, and other problems.  Blood pressure consists of two numbers, a higher number over a lower, 110/72, for example. It is stated as "110 over 72." The ideal is below 120 for the top number (systolic) and under 80 for the bottom (diastolic). Write down your blood pressure today. You should pay close attention to your blood pressure if you have certain conditions such as:  Heart failure.   Prior heart attack.   Diabetes   Chronic kidney disease.   Prior stroke.   Multiple risk factors for heart disease.  To see if you have HTN, your blood pressure should be measured while you are seated with your arm held at the level of the heart. It should be measured at least twice. A one-time elevated blood pressure reading (especially in the Emergency Department) does not mean that you need treatment. There may be conditions in which the blood pressure is different between your right and left arms. It is important to see your caregiver soon for a recheck. Most people have essential hypertension which means that there is not a specific cause. This type of  high blood pressure may be lowered by changing lifestyle factors such as:  Stress.   Smoking.   Lack of exercise.   Excessive weight.   Drug/tobacco/alcohol use.   Eating less salt.  Most people do not have symptoms from high blood pressure until it has caused damage to the body. Effective treatment can often prevent, delay or reduce that damage. TREATMENT  When a cause has been identified, treatment for high blood pressure is directed at the cause. There are a large number of medications to treat HTN. These fall into several categories, and your caregiver will help you select the medicines that are best for you. Medications may have side effects. You should review side effects with your caregiver. If your blood pressure stays high after you have made lifestyle changes or started on medicines,   Your medication(s) may need to be changed.   Other problems may need to be addressed.   Be certain  you understand your prescriptions, and know how and when to take your medicine.   Be sure to follow up with your caregiver within the time frame advised (usually within two weeks) to have your blood pressure rechecked and to review your medications.   If you are taking more than one medicine to lower your blood pressure, make sure you know how and at what times they should be taken. Taking two medicines at the same time can result in blood pressure that is too low.  SEEK IMMEDIATE MEDICAL CARE IF:  You develop a severe headache, blurred or changing vision, or confusion.   You have unusual weakness or numbness, or a faint feeling.   You have severe chest or abdominal pain, vomiting, or breathing problems.  MAKE SURE YOU:   Understand these instructions.   Will watch your condition.   Will get help right away if you are not doing well or get worse.  Document Released: 08/12/2005 Document Revised: 04/24/2011 Document Reviewed: 04/01/2008 [facility] Patient Information 2012 [place],  [place].

## 2011-08-30 NOTE — Assessment & Plan Note (Signed)
Check labs con't meds 

## 2011-11-05 ENCOUNTER — Encounter: Payer: Self-pay | Admitting: Family Medicine

## 2011-11-05 ENCOUNTER — Ambulatory Visit (INDEPENDENT_AMBULATORY_CARE_PROVIDER_SITE_OTHER): Payer: Self-pay | Admitting: Family Medicine

## 2011-11-05 VITALS — BP 120/72 | HR 75 | Temp 98.5°F | Wt 170.4 lb

## 2011-11-05 DIAGNOSIS — R229 Localized swelling, mass and lump, unspecified: Secondary | ICD-10-CM

## 2011-11-05 DIAGNOSIS — IMO0002 Reserved for concepts with insufficient information to code with codable children: Secondary | ICD-10-CM

## 2011-11-05 DIAGNOSIS — J329 Chronic sinusitis, unspecified: Secondary | ICD-10-CM

## 2011-11-05 MED ORDER — CEFUROXIME AXETIL 500 MG PO TABS: 500.0000 mg | ORAL_TABLET | Freq: Two times a day (BID) | ORAL | Status: AC

## 2011-11-05 MED ORDER — AZELASTINE-FLUTICASONE 137-50 MCG/ACT NA SUSP: 1.0000 | Freq: Two times a day (BID) | NASAL | Status: DC

## 2011-11-05 NOTE — Progress Notes (Signed)
  Subjective:     Dominique Gonzalez is a 61 y.o. female who presents for evaluation of sinus pain. Symptoms include: congestion, cough, facial pain, nasal congestion, post nasal drip and sinus pressure. Onset of symptoms was 1 week ago. Symptoms have been gradually worsening since that time. Past history is significant for no history of pneumonia or bronchitis. Patient is a former smoker.  The following portions of the patient's history were reviewed and updated as appropriate: allergies, current medications, past family history, past medical history, past social history, past surgical history and problem list.  Review of Systems Pertinent items are noted in HPI.   Objective:    BP 120/72  Pulse 75  Temp(Src) 98.5 F (36.9 C) (Oral)  Wt 170 lb 6.4 oz (77.293 kg)  SpO2 97% General appearance: alert, cooperative, appears stated age and no distress Ears: normal TM's and external ear canals both ears Nose: green discharge, mild congestion, turbinates red, swollen, sinus tenderness bilateral Throat: lips, mucosa, and tongue normal; teeth and gums normal Neck: moderate anterior cervical adenopathy, no carotid bruit, no JVD, supple, symmetrical, trachea midline and ? mass vs adenopathy R base of neck Lungs: clear to auscultation bilaterally    Assessment:    Acute bacterial sinusitis.   adenopathy vs other mass----  cxr vs CT Plan:    Nasal steroids per medication orders. Ceftin per medication orders.

## 2011-11-06 ENCOUNTER — Ambulatory Visit (INDEPENDENT_AMBULATORY_CARE_PROVIDER_SITE_OTHER)
Admission: RE | Admit: 2011-11-06 | Discharge: 2011-11-06 | Disposition: A | Discharge status: Home / Self Care | Payer: Self-pay | Source: Ambulatory Visit | Attending: Family Medicine | Admitting: Family Medicine

## 2011-11-06 DIAGNOSIS — R229 Localized swelling, mass and lump, unspecified: Secondary | ICD-10-CM

## 2011-11-06 DIAGNOSIS — IMO0002 Reserved for concepts with insufficient information to code with codable children: Secondary | ICD-10-CM

## 2011-11-06 LAB — HEPATIC FUNCTION PANEL
AST: 21 U/L (ref 0–37)
Albumin: 4.1 g/dL (ref 3.5–5.2)
Alkaline Phosphatase: 92 U/L (ref 39–117)
Bilirubin, Direct: 0.1 mg/dL (ref 0.0–0.3)

## 2011-11-06 LAB — BASIC METABOLIC PANEL
Calcium: 9.1 mg/dL (ref 8.4–10.5)
GFR: 108.64 mL/min (ref >60.00)
Sodium: 141 mEq/L (ref 135–145)

## 2011-11-06 LAB — CBC WITH DIFFERENTIAL/PLATELET
Basophils Absolute: 0 10*3/uL (ref 0.0–0.1)
Basophils Relative: 0.2 % (ref 0.0–3.0)
Hemoglobin: 13.5 g/dL (ref 12.0–15.0)
Lymphocytes Relative: 38.1 % (ref 12.0–46.0)
Monocytes Relative: 5.7 % (ref 3.0–12.0)
Neutro Abs: 3.1 10*3/uL (ref 1.4–7.7)
Neutrophils Relative %: 53.2 % (ref 43.0–77.0)
RBC: 4.51 Mil/uL (ref 3.87–5.11)
RDW: 13.2 % (ref 11.5–14.6)

## 2012-02-11 ENCOUNTER — Encounter: Payer: Self-pay | Admitting: Family Medicine

## 2012-02-11 ENCOUNTER — Ambulatory Visit (INDEPENDENT_AMBULATORY_CARE_PROVIDER_SITE_OTHER): Payer: Self-pay | Admitting: Family Medicine

## 2012-02-11 VITALS — BP 114/70 | HR 80 | Temp 98.3°F | Wt 169.8 lb

## 2012-02-11 DIAGNOSIS — R300 Dysuria: Secondary | ICD-10-CM

## 2012-02-11 LAB — POCT URINALYSIS DIPSTICK
Bilirubin, UA: NEGATIVE
Glucose, UA: NEGATIVE
Ketones, UA: NEGATIVE
pH, UA: 7.5

## 2012-02-11 MED ORDER — CIPROFLOXACIN HCL 250 MG PO TABS: 250.0000 mg | ORAL_TABLET | Freq: Two times a day (BID) | ORAL | Status: DC

## 2012-02-11 NOTE — Patient Instructions (Signed)

## 2012-02-11 NOTE — Progress Notes (Signed)
  Subjective:    Dominique Gonzalez is a 61 y.o. female who complains of burning with urination, frequency and suprapubic pressure. She has had symptoms for 2 days. Patient also complains of back pain. Patient denies congestion, cough, fever, headache, rhinitis, sorethroat, stomach ache and vaginal discharge. Patient does not have a history of recurrent UTI. Patient does not have a history of pyelonephritis.   The following portions of the patient's history were reviewed and updated as appropriate: allergies, current medications, past family history, past medical history, past social history, past surgical history and problem list.  Review of Systems Pertinent items are noted in HPI.    Objective:    BP 114/70  Pulse 80  Temp 98.3 F (36.8 C) (Oral)  Wt 169 lb 12.8 oz (77.021 kg)  SpO2 99% General appearance: alert, cooperative, appears stated age and no distress Abdomen: soft, non-tender; bowel sounds normal; no masses,  no organomegaly  Laboratory:  Urine dipstick: trace for leukocyte esterase.   Micro exam: not done.    Assessment:    dysuria     Plan:    Medications: ciprofloxacin. Maintain adequate hydration. Follow up if symptoms not improving, and as needed.

## 2012-02-13 LAB — URINE CULTURE: Colony Count: 2000

## 2012-04-28 ENCOUNTER — Other Ambulatory Visit: Payer: Self-pay | Admitting: Family Medicine

## 2012-04-28 MED ORDER — LISINOPRIL-HYDROCHLOROTHIAZIDE 10-12.5 MG PO TABS: 1.0000 | ORAL_TABLET | Freq: Every day | ORAL | Status: DC

## 2012-04-28 NOTE — Telephone Encounter (Signed)
Refill Lisinop/HCTZ 10-12. Tab Lupio #30 take one tablet by mouth daily Last fill 6.28.13 Last ov 6.18.13 acute

## 2012-09-30 ENCOUNTER — Encounter: Payer: Self-pay | Admitting: Family Medicine

## 2012-09-30 ENCOUNTER — Ambulatory Visit (INDEPENDENT_AMBULATORY_CARE_PROVIDER_SITE_OTHER): Payer: Self-pay | Admitting: Family Medicine

## 2012-09-30 VITALS — BP 128/80 | HR 80 | Temp 98.1°F | Wt 171.2 lb

## 2012-09-30 DIAGNOSIS — R221 Localized swelling, mass and lump, neck: Secondary | ICD-10-CM

## 2012-09-30 DIAGNOSIS — I10 Essential (primary) hypertension: Secondary | ICD-10-CM

## 2012-09-30 DIAGNOSIS — R220 Localized swelling, mass and lump, head: Secondary | ICD-10-CM

## 2012-09-30 LAB — BASIC METABOLIC PANEL
GFR: 97.03 mL/min (ref >60.00)
Glucose, Bld: 101 mg/dL — ABNORMAL HIGH (ref 70–99)
Potassium: 3.7 mEq/L (ref 3.5–5.1)
Sodium: 140 mEq/L (ref 135–145)

## 2012-09-30 MED ORDER — LISINOPRIL-HYDROCHLOROTHIAZIDE 10-12.5 MG PO TABS: 1.0000 | ORAL_TABLET | Freq: Every day | ORAL | Status: DC

## 2012-09-30 NOTE — Progress Notes (Signed)
  Subjective:    Patient ID: TALISE SLIGH, female    DOB: 1951/02/27, 62 y.o.   MRN: [id number]  HPI Pt here c/o lump in neck.  She said sometimes it is tender.  No other symptoms.  She noticed it just recently and is unsure how long it has been there.  She can not feel it today but area is tender.      Review of Systems As above    Objective:   Physical Exam  BP 128/80  Pulse 80  Temp 98.1 F (36.7 C) (Oral)  Wt 171 lb 3.2 oz (77.656 kg)  SpO2 95% General appearance: alert, cooperative, appears stated age and no distress Ears: normal TM's and external ear canals both ears Nose: Nares normal. Septum midline. Mucosa normal. No drainage or sinus tenderness. Throat: lips, mucosa, and tongue normal; teeth and gums normal Neck: no adenopathy, no carotid bruit, no JVD, supple, symmetrical, trachea midline and thyroid not enlarged, symmetric, no tenderness/mass/nodules Lungs: clear to auscultation bilaterally      Assessment & Plan:  Throat pain--  No mass palpated but pt would like US done                         Will check TSH as well

## 2012-10-01 NOTE — Assessment & Plan Note (Signed)
Stable Check bmp

## 2012-10-05 ENCOUNTER — Telehealth: Payer: Self-pay | Admitting: Family Medicine

## 2012-10-05 NOTE — Telephone Encounter (Signed)
Patient calling about her lab results.  She received the letter mailed on 2/6 and the 101 glucose result had an * beside of it.  Asking what that meant.  Reviewed the findings with her showing that it was 2 mg higher then the normal.  Reassurance offered.   She also asked about the referral for an ultrasound on her neck.   The order is in the chart but no date and time.  Please call patient when this is scheduled.

## 2012-10-14 ENCOUNTER — Encounter: Payer: Self-pay | Admitting: Family Medicine

## 2012-10-15 ENCOUNTER — Telehealth: Payer: Self-pay | Admitting: Family Medicine

## 2012-10-15 NOTE — Telephone Encounter (Signed)
refill CEFUROXIME 500MG  Tab #20 take 1 tablet by mouth 2 times daily last fill 3.15.13

## 2012-10-15 NOTE — Telephone Encounter (Signed)
Spoke with patient and she stated she did not request this Rx.      KP

## 2012-10-19 ENCOUNTER — Ambulatory Visit (HOSPITAL_BASED_OUTPATIENT_CLINIC_OR_DEPARTMENT_OTHER)
Admission: RE | Admit: 2012-10-19 | Discharge: 2012-10-19 | Disposition: A | Discharge status: Home / Self Care | Payer: Self-pay | Source: Ambulatory Visit | Attending: Family Medicine | Admitting: Family Medicine

## 2012-10-19 DIAGNOSIS — E041 Nontoxic single thyroid nodule: Secondary | ICD-10-CM | POA: Insufficient documentation

## 2012-10-19 DIAGNOSIS — R221 Localized swelling, mass and lump, neck: Secondary | ICD-10-CM

## 2012-10-21 ENCOUNTER — Telehealth: Payer: Self-pay | Admitting: *Deleted

## 2012-10-21 DIAGNOSIS — R221 Localized swelling, mass and lump, neck: Secondary | ICD-10-CM

## 2012-10-21 NOTE — Telephone Encounter (Signed)
Message copied by Verdene Rio on Wed Oct 21, 2012  3:06 PM ------      Message from: Lelon Perla      Created: Wed Oct 21, 2012 11:31 AM       B/l solid nodules---   Can refer to ent for evaluation ------

## 2012-11-26 ENCOUNTER — Encounter: Payer: Self-pay | Admitting: Physician Assistant

## 2012-12-24 ENCOUNTER — Emergency Department (HOSPITAL_BASED_OUTPATIENT_CLINIC_OR_DEPARTMENT_OTHER)
Admission: EM | Admit: 2012-12-24 | Discharge: 2012-12-24 | Disposition: A | Discharge status: Home / Self Care | Payer: BC Managed Care – PPO | Attending: Emergency Medicine | Admitting: Emergency Medicine

## 2012-12-24 ENCOUNTER — Encounter (HOSPITAL_BASED_OUTPATIENT_CLINIC_OR_DEPARTMENT_OTHER): Payer: Self-pay

## 2012-12-24 DIAGNOSIS — Z7982 Long term (current) use of aspirin: Secondary | ICD-10-CM | POA: Insufficient documentation

## 2012-12-24 DIAGNOSIS — M5432 Sciatica, left side: Secondary | ICD-10-CM

## 2012-12-24 DIAGNOSIS — Z8679 Personal history of other diseases of the circulatory system: Secondary | ICD-10-CM | POA: Insufficient documentation

## 2012-12-24 DIAGNOSIS — M5430 Sciatica, unspecified side: Secondary | ICD-10-CM | POA: Insufficient documentation

## 2012-12-24 DIAGNOSIS — M949 Disorder of cartilage, unspecified: Secondary | ICD-10-CM | POA: Insufficient documentation

## 2012-12-24 DIAGNOSIS — Z87891 Personal history of nicotine dependence: Secondary | ICD-10-CM | POA: Insufficient documentation

## 2012-12-24 DIAGNOSIS — E785 Hyperlipidemia, unspecified: Secondary | ICD-10-CM | POA: Insufficient documentation

## 2012-12-24 DIAGNOSIS — Z8639 Personal history of other endocrine, nutritional and metabolic disease: Secondary | ICD-10-CM | POA: Insufficient documentation

## 2012-12-24 DIAGNOSIS — I10 Essential (primary) hypertension: Secondary | ICD-10-CM | POA: Insufficient documentation

## 2012-12-24 DIAGNOSIS — Z8709 Personal history of other diseases of the respiratory system: Secondary | ICD-10-CM | POA: Insufficient documentation

## 2012-12-24 DIAGNOSIS — M899 Disorder of bone, unspecified: Secondary | ICD-10-CM | POA: Insufficient documentation

## 2012-12-24 DIAGNOSIS — Z79899 Other long term (current) drug therapy: Secondary | ICD-10-CM | POA: Insufficient documentation

## 2012-12-24 DIAGNOSIS — R011 Cardiac murmur, unspecified: Secondary | ICD-10-CM | POA: Insufficient documentation

## 2012-12-24 MED ORDER — KETOROLAC TROMETHAMINE 60 MG/2ML IM SOLN
30.0000 mg | Freq: Once | INTRAMUSCULAR | Status: AC
  Administered 2012-12-24: 30 mg via INTRAMUSCULAR
  Filled 2012-12-24: qty 2

## 2012-12-24 MED ORDER — HYDROCODONE-ACETAMINOPHEN 5-325 MG PO TABS: 2.0000 | ORAL_TABLET | ORAL | Status: DC | PRN

## 2012-12-24 MED ORDER — IBUPROFEN 600 MG PO TABS: 600.0000 mg | ORAL_TABLET | Freq: Four times a day (QID) | ORAL | Status: DC | PRN

## 2012-12-24 MED ORDER — CYCLOBENZAPRINE HCL 10 MG PO TABS: 10.0000 mg | ORAL_TABLET | Freq: Two times a day (BID) | ORAL | Status: DC | PRN

## 2012-12-24 NOTE — ED Provider Notes (Signed)
History     CSN: [id number]  Arrival date & time 12/24/12  1806   First MD Initiated Contact with Patient 12/24/12 1825      Chief Complaint  Patient presents with  . Back Pain     HPI Patient presents with left lower back pain radiating into left leg and down left leg for last 4-5 days.  Patient denies weakness.  Denies paresthesias.  Denies loss of bowel or bladder.  Pain is worse with bending stooping or certain movements.  Pain generally controlled with ibuprofen until the last 24 hours. Past Medical History  Diagnosis Date  . HTN (hypertension)   . Aortic insufficiency     mild  . Hyperlipidemia   . Diastolic dysfunction   . Cardiac murmur   . Fatigue   . Leg pain, right   . Hip pain, right   . B12 deficiency   . Sinusitis, acute     NOS  . URI (upper respiratory infection)   . Osteopenia   . Shoulder pain, left     Past Surgical History  Procedure Laterality Date  . Abdominal hysterectomy      age 88  . Bilateral oophorectomy      Family History  Problem Relation Age of Onset  . Hypertension Father   . Hypertension Mother   . Breast cancer Mother   . Diabetes Father     History  Substance Use Topics  . Smoking status: Former Smoker    Quit date: 01/10/1996  . Smokeless tobacco: Not on file     Comment: smoked for 10 years,   . Alcohol Use: No    OB History   Grav Para Term Preterm Abortions TAB SAB Ect Mult Living                  Review of Systems All other systems reviewed and are negative Allergies  Review of patient's allergies indicates no known allergies.  Home Medications   Current Outpatient Rx  Name  Route  Sig  Dispense  Refill  . aspirin 81 MG tablet   Oral   Take 81 mg by mouth daily.           . Azelastine-Fluticasone (DYMISTA) 137-50 MCG/ACT SUSP   Nasal   Place 1 spray into the nose 2 (two) times daily.   1 Bottle   0   . Cholecalciferol (VITAMIN D3) 2000 UNITS capsule   Oral   Take 2,000 Units by mouth daily.            . cyclobenzaprine (FLEXERIL) 10 MG tablet   Oral   Take 1 tablet (10 mg total) by mouth 2 (two) times daily as needed for muscle spasms.   20 tablet   0   . ergocalciferol (VITAMIN D2) 50000 UNITS capsule   Oral   Take 1 capsule (50,000 Units total) by mouth once a week.   4 capsule   5   . HYDROcodone-acetaminophen (NORCO/VICODIN) 5-325 MG per tablet   Oral   Take 2 tablets by mouth every 4 (four) hours as needed for pain.   15 tablet   0   . ibuprofen (ADVIL,MOTRIN) 600 MG tablet   Oral   Take 1 tablet (600 mg total) by mouth every 6 (six) hours as needed for pain.   30 tablet   0   . lisinopril-hydrochlorothiazide (PRINZIDE,ZESTORETIC) 10-12.5 MG per tablet   Oral   Take 1 tablet by mouth daily.   30 tablet  5   . Nutritional Supplements (JUICE PLUS FIBRE PO)   Oral   Take by mouth.           . potassium chloride SA (KLOR-CON M20) 20 MEQ tablet   Oral   Take 1 tablet (20 mEq total) by mouth 2 (two) times daily.   90 tablet   3   . risedronate (ACTONEL) 150 MG tablet   Oral   Take 150 mg by mouth every 30 (thirty) days. with water on empty stomach, nothing by mouth or lie down for next 30 minutes.            BP 131/81  Pulse 78  Temp(Src) 98.7 F (37.1 C) (Oral)  Resp 16  Ht 5\' 4"  (1.626 m)  Wt 160 lb (72.576 kg)  BMI 27.45 kg/m2  SpO2 98%  Physical Exam  Nursing note and vitals reviewed. Constitutional: She is oriented to person, place, and time. She appears well-developed and well-nourished. No distress.  HENT:  Head: Normocephalic and atraumatic.  Eyes: Pupils are equal, round, and reactive to light.  Neck: Normal range of motion.  Cardiovascular: Normal rate and intact distal pulses.   Pulmonary/Chest: No respiratory distress.  Abdominal: Normal appearance. She exhibits no distension.  Musculoskeletal:       Back:  Neurological: She is alert and oriented to person, place, and time. No cranial nerve deficit.  Skin: Skin is  warm and dry. No rash noted.  Psychiatric: She has a normal mood and affect. Her behavior is normal.    ED Course  Procedures (including critical care time)  Labs Reviewed - No data to display No results found.   1. Sciatica, left       MDM         Nelia Shi, MD 12/24/12 1843

## 2012-12-24 NOTE — ED Notes (Signed)
Pt reports onset of back pain Sunday, worsening today.  Pain is described as intermittent on left hip radiating to left leg.  Denies injury, numbness or tingling in extremities.

## 2012-12-24 NOTE — ED Notes (Signed)
C/o lower back pain x 5 days denies injury.  

## 2013-01-05 ENCOUNTER — Ambulatory Visit (INDEPENDENT_AMBULATORY_CARE_PROVIDER_SITE_OTHER): Payer: BC Managed Care – PPO | Admitting: Family Medicine

## 2013-01-05 ENCOUNTER — Encounter: Payer: Self-pay | Admitting: Family Medicine

## 2013-01-05 VITALS — BP 116/78 | HR 99 | Temp 98.9°F | Wt 167.0 lb

## 2013-01-05 DIAGNOSIS — J069 Acute upper respiratory infection, unspecified: Secondary | ICD-10-CM

## 2013-01-05 MED ORDER — GUAIFENESIN-CODEINE 100-10 MG/5ML PO SYRP: ORAL_SOLUTION | ORAL | Status: DC

## 2013-01-05 NOTE — Patient Instructions (Addendum)

## 2013-01-05 NOTE — Progress Notes (Signed)
  Subjective:     Dominique Gonzalez is a 62 y.o. female who presents for evaluation of symptoms of a URI. Symptoms include congestion, nasal congestion, no  fever, non productive cough and post nasal drip. Onset of symptoms was 2 days ago, and has been gradually worsening since that time. Treatment to date: none.  The following portions of the patient's history were reviewed and updated as appropriate: allergies, current medications, past family history, past medical history, past social history, past surgical history and problem list.  Review of Systems Pertinent items are noted in HPI.   Objective:    BP 116/78  Pulse 99  Temp(Src) 98.9 F (37.2 C) (Oral)  Wt 167 lb (75.751 kg)  BMI 28.65 kg/m2  SpO2 96% General appearance: alert, cooperative, appears stated age and no distress Ears: normal TM's and external ear canals both ears Nose: clear discharge, mild congestion, turbinates red, swollen, no sinus tenderness Throat: abnormal findings: pnd Neck: no adenopathy and thyroid not enlarged, symmetric, no tenderness/mass/nodules Lungs: clear to auscultation bilaterally Heart: S1, S2 normal   Assessment:    viral upper respiratory illness   Plan:    Suggested symptomatic OTC remedies. Nasal saline spray for congestion. Nasal steroids per orders. Follow up as needed. Call in a few days if symptoms aren't resolving.

## 2013-01-21 ENCOUNTER — Encounter (HOSPITAL_BASED_OUTPATIENT_CLINIC_OR_DEPARTMENT_OTHER): Payer: Self-pay | Admitting: Family Medicine

## 2013-01-21 ENCOUNTER — Emergency Department (HOSPITAL_BASED_OUTPATIENT_CLINIC_OR_DEPARTMENT_OTHER)
Admission: EM | Admit: 2013-01-21 | Discharge: 2013-01-21 | Disposition: A | Discharge status: Home / Self Care | Payer: BC Managed Care – PPO | Attending: Emergency Medicine | Admitting: Emergency Medicine

## 2013-01-21 ENCOUNTER — Ambulatory Visit: Payer: Self-pay | Admitting: Cardiovascular Disease

## 2013-01-21 DIAGNOSIS — M25552 Pain in left hip: Secondary | ICD-10-CM

## 2013-01-21 DIAGNOSIS — Z87891 Personal history of nicotine dependence: Secondary | ICD-10-CM | POA: Insufficient documentation

## 2013-01-21 DIAGNOSIS — Z7982 Long term (current) use of aspirin: Secondary | ICD-10-CM | POA: Insufficient documentation

## 2013-01-21 DIAGNOSIS — Z862 Personal history of diseases of the blood and blood-forming organs and certain disorders involving the immune mechanism: Secondary | ICD-10-CM | POA: Insufficient documentation

## 2013-01-21 DIAGNOSIS — M25559 Pain in unspecified hip: Secondary | ICD-10-CM | POA: Insufficient documentation

## 2013-01-21 DIAGNOSIS — Z8679 Personal history of other diseases of the circulatory system: Secondary | ICD-10-CM | POA: Insufficient documentation

## 2013-01-21 DIAGNOSIS — Z8639 Personal history of other endocrine, nutritional and metabolic disease: Secondary | ICD-10-CM | POA: Insufficient documentation

## 2013-01-21 DIAGNOSIS — R011 Cardiac murmur, unspecified: Secondary | ICD-10-CM | POA: Insufficient documentation

## 2013-01-21 DIAGNOSIS — Z8709 Personal history of other diseases of the respiratory system: Secondary | ICD-10-CM | POA: Insufficient documentation

## 2013-01-21 DIAGNOSIS — Z79899 Other long term (current) drug therapy: Secondary | ICD-10-CM | POA: Insufficient documentation

## 2013-01-21 DIAGNOSIS — M899 Disorder of bone, unspecified: Secondary | ICD-10-CM | POA: Insufficient documentation

## 2013-01-21 DIAGNOSIS — I10 Essential (primary) hypertension: Secondary | ICD-10-CM | POA: Insufficient documentation

## 2013-01-21 MED ORDER — NAPROXEN 500 MG PO TABS: 500.0000 mg | ORAL_TABLET | Freq: Two times a day (BID) | ORAL | Status: DC

## 2013-01-21 MED ORDER — KETOROLAC TROMETHAMINE 30 MG/ML IJ SOLN
30.0000 mg | Freq: Once | INTRAMUSCULAR | Status: AC
  Administered 2013-01-21: 30 mg via INTRAMUSCULAR
  Filled 2013-01-21: qty 1

## 2013-01-21 NOTE — ED Provider Notes (Signed)
Medical screening examination/treatment/procedure(s) were performed by non-physician practitioner and as supervising physician I was immediately available for consultation/collaboration.   Elvera Almario B. Bernette Mayers, MD 01/21/13 2358

## 2013-01-21 NOTE — ED Notes (Signed)
Pt c/o left low back pain x 1 day and sts this is same pain she was treated for last time. Pt sts "they said I had sciatica".

## 2013-01-21 NOTE — ED Provider Notes (Signed)
History     CSN: [id number]  Arrival date & time 01/21/13  1737   First MD Initiated Contact with Patient 01/21/13 1801      Chief Complaint  Patient presents with  . Back Pain    (Consider location/radiation/quality/duration/timing/severity/associated sxs/prior treatment) Patient is a 62 y.o. female presenting with back pain. The history is provided by the patient and medical records. No language interpreter was used.  Back Pain Location:  Gluteal region Quality:  Aching and stabbing Radiates to:  Does not radiate Pain severity:  Moderate Onset quality:  Gradual Duration:  1 day Timing:  Constant Progression:  Worsening Chronicity:  Recurrent Relieved by:  None tried Worsened by:  Movement Ineffective treatments:  Ibuprofen Associated symptoms: no leg pain and no perianal numbness    Patient was here about a month ago with same symptoms, but pain was worse at that time.  Patient concerned that pain will get worse today if not treated.  Patient received toradol IM with good results last visit, and would like that repeated today if appropriate.  Past Medical History  Diagnosis Date  . HTN (hypertension)   . Aortic insufficiency     mild  . Hyperlipidemia   . Diastolic dysfunction   . Cardiac murmur   . Fatigue   . Leg pain, right   . Hip pain, right   . B12 deficiency   . Sinusitis, acute     NOS  . URI (upper respiratory infection)   . Osteopenia   . Shoulder pain, left     Past Surgical History  Procedure Laterality Date  . Abdominal hysterectomy      age 63  . Bilateral oophorectomy      Family History  Problem Relation Age of Onset  . Hypertension Father   . Hypertension Mother   . Breast cancer Mother   . Diabetes Father     History  Substance Use Topics  . Smoking status: Former Smoker    Quit date: 01/10/1996  . Smokeless tobacco: Not on file     Comment: smoked for 10 years,   . Alcohol Use: No    OB History   Grav Para Term Preterm  Abortions TAB SAB Ect Mult Living                  Review of Systems  Musculoskeletal: Positive for back pain.  All other systems reviewed and are negative.    Allergies  Review of patient's allergies indicates no known allergies.  Home Medications   Current Outpatient Rx  Name  Route  Sig  Dispense  Refill  . aspirin 81 MG tablet   Oral   Take 81 mg by mouth daily.           . Azelastine-Fluticasone (DYMISTA) 137-50 MCG/ACT SUSP   Nasal   Place 1 spray into the nose 2 (two) times daily.   1 Bottle   0   . Cholecalciferol (VITAMIN D3) 2000 UNITS capsule   Oral   Take 2,000 Units by mouth daily.           . cyclobenzaprine (FLEXERIL) 10 MG tablet   Oral   Take 1 tablet (10 mg total) by mouth 2 (two) times daily as needed for muscle spasms.   20 tablet   0   . ergocalciferol (VITAMIN D2) 50000 UNITS capsule   Oral   Take 1 capsule (50,000 Units total) by mouth once a week.   4 capsule  5   . guaiFENesin-codeine (ROBITUSSIN AC) 100-10 MG/5ML syrup      1-2 tsp po qhs prn cough   120 mL   0   . HYDROcodone-acetaminophen (NORCO/VICODIN) 5-325 MG per tablet   Oral   Take 2 tablets by mouth every 4 (four) hours as needed for pain.   15 tablet   0   . ibuprofen (ADVIL,MOTRIN) 600 MG tablet   Oral   Take 1 tablet (600 mg total) by mouth every 6 (six) hours as needed for pain.   30 tablet   0   . lisinopril-hydrochlorothiazide (PRINZIDE,ZESTORETIC) 10-12.5 MG per tablet   Oral   Take 1 tablet by mouth daily.   30 tablet   5   . Nutritional Supplements (JUICE PLUS FIBRE PO)   Oral   Take by mouth.           . potassium chloride SA (KLOR-CON M20) 20 MEQ tablet   Oral   Take 1 tablet (20 mEq total) by mouth 2 (two) times daily.   90 tablet   3   . risedronate (ACTONEL) 150 MG tablet   Oral   Take 150 mg by mouth every 30 (thirty) days. with water on empty stomach, nothing by mouth or lie down for next 30 minutes.            BP 113/64   Pulse 88  Temp(Src) 98.4 F (36.9 C) (Oral)  Resp 16  Ht 5\' 4"  (1.626 m)  Wt 160 lb (72.576 kg)  BMI 27.45 kg/m2  SpO2 99%  Physical Exam  Nursing note and vitals reviewed. Constitutional: She is oriented to person, place, and time. She appears well-developed and well-nourished.  HENT:  Head: Normocephalic.  Eyes: Pupils are equal, round, and reactive to light.  Neck: Normal range of motion.  Cardiovascular: Normal rate and regular rhythm.   Pulmonary/Chest: Effort normal and breath sounds normal.  Abdominal: Soft. Bowel sounds are normal.  Musculoskeletal: Normal range of motion. She exhibits no edema and no tenderness.  Neurological: She is alert and oriented to person, place, and time.  Skin: Skin is warm and dry.  Psychiatric: She has a normal mood and affect. Her behavior is normal. Judgment and thought content normal.    ED Course  Procedures (including critical care time)  Labs Reviewed - No data to display No results found.   No diagnosis found.  Left hip pain, ? Sciatica.  MDM          Jimmye Norman, NP 01/21/13 [phone]

## 2013-02-23 ENCOUNTER — Encounter: Payer: Self-pay | Admitting: Family Medicine

## 2013-03-01 ENCOUNTER — Ambulatory Visit (INDEPENDENT_AMBULATORY_CARE_PROVIDER_SITE_OTHER): Payer: BC Managed Care – PPO | Admitting: Cardiovascular Disease

## 2013-03-01 ENCOUNTER — Encounter: Payer: Self-pay | Admitting: Cardiovascular Disease

## 2013-03-01 VITALS — BP 122/78 | HR 78 | Ht 63.0 in | Wt 165.8 lb

## 2013-03-01 DIAGNOSIS — I313 Pericardial effusion (noninflammatory): Secondary | ICD-10-CM

## 2013-03-01 DIAGNOSIS — I351 Nonrheumatic aortic (valve) insufficiency: Secondary | ICD-10-CM

## 2013-03-01 DIAGNOSIS — I359 Nonrheumatic aortic valve disorder, unspecified: Secondary | ICD-10-CM

## 2013-03-01 DIAGNOSIS — I319 Disease of pericardium, unspecified: Secondary | ICD-10-CM

## 2013-03-01 DIAGNOSIS — I3139 Other pericardial effusion (noninflammatory): Secondary | ICD-10-CM

## 2013-03-01 NOTE — Patient Instructions (Addendum)

## 2013-03-01 NOTE — Progress Notes (Signed)
History of Present Illness: 62 yo WF with history of HTN and borderline hyperlipidemia here today for follow up. She was seen last in 2012. Initially seen in 2011 for evaluation of abnormal echocardiogram. Echo was ordered in workup of a murmur. Her echo in 2011 showed normal LV systolic function, diastolic dysfunction, mild AI, small effusion and possible epicardial mass/aortic root mass. I ordered a cardiac MRI which showed prominent epicardial fat pad with no evidence of aortic or pericardial tumor or mass. Echo 01/22/11 with normal LV size and function. No valve issues. Small pericardial effusion.   She is here today for follow up. She describes pain in her left shoulder when she moves her left arm. No exertional chest pain or pressure.  The left shoulder pain has been present for years. No SOB, palpitations, near syncope or syncope. No LE edema.   Primary Care Physician:  Dr. Laury Axon  Last Lipid Profile:Lipid Panel     Component Value Date/Time   CHOL 187 08/30/2011 1041   TRIG 93.0 08/30/2011 1041   HDL 41.80 08/30/2011 1041   CHOLHDL 4 08/30/2011 1041   VLDL 18.6 08/30/2011 1041   LDLCALC 127* 08/30/2011 1041     Past Medical History  Diagnosis Date  . HTN (hypertension)   . Aortic insufficiency     mild  . Hyperlipidemia   . Diastolic dysfunction   . Cardiac murmur   . Fatigue   . Leg pain, right   . Hip pain, right   . B12 deficiency   . Sinusitis, acute     NOS  . URI (upper respiratory infection)   . Osteopenia   . Shoulder pain, left     Past Surgical History  Procedure Laterality Date  . Abdominal hysterectomy      age 14  . Bilateral oophorectomy      Current Outpatient Prescriptions  Medication Sig Dispense Refill  . aspirin 81 MG tablet Take 81 mg by mouth daily.        . Cholecalciferol (VITAMIN D3) 2000 UNITS capsule Take 2,000 Units by mouth daily.        Marland Kitchen lisinopril-hydrochlorothiazide (PRINZIDE,ZESTORETIC) 10-12.5 MG per tablet Take 1 tablet by mouth daily.   30 tablet  5  . Nutritional Supplements (JUICE PLUS FIBRE PO) Take by mouth.        . potassium chloride SA (KLOR-CON M20) 20 MEQ tablet Take 1 tablet (20 mEq total) by mouth 2 (two) times daily.  90 tablet  3  . risedronate (ACTONEL) 150 MG tablet Take 150 mg by mouth every 30 (thirty) days. with water on empty stomach, nothing by mouth or lie down for next 30 minutes.        No current facility-administered medications for this visit.    No Known Allergies  History   Social History  . Marital Status: Married    Spouse Name: N/A    Number of Children: 2  . Years of Education: N/A   Occupational History  . Not on file.   Social History Main Topics  . Smoking status: Former Smoker    Quit date: 01/10/1996  . Smokeless tobacco: Not on file     Comment: smoked for 10 years,   . Alcohol Use: No  . Drug Use: No  . Sexually Active: Not on file   Other Topics Concern  . Not on file   Social History Narrative  . No narrative on file    Family History  Problem Relation Age  of Onset  . Hypertension Father   . Hypertension Mother   . Breast cancer Mother   . Diabetes Father     Review of Systems:  As stated in the HPI and otherwise negative.   BP 122/78  Pulse 78  Ht 5\' 3"  (1.6 m)  Wt 165 lb 12.8 oz (75.206 kg)  BMI 29.38 kg/m2  Physical Examination: General: Well developed, well nourished, NAD HEENT: OP clear, mucus membranes moist SKIN: warm, dry. No rashes. Neuro: No focal deficits Musculoskeletal: Muscle strength 5/5 all ext Psychiatric: Mood and affect normal Neck: No JVD, no carotid bruits, no thyromegaly, no lymphadenopathy. Lungs:Clear bilaterally, no wheezes, rhonci, crackles Cardiovascular: Regular rate and rhythm. No murmurs, gallops or rubs. Abdomen:Soft. Bowel sounds present. Non-tender.  Extremities: No lower extremity edema. Pulses are 2 + in the bilateral DP/PT.  EKG: NSR, rate 78 bpm. Low voltage.   Echo 01/22/11: Left ventricle: The cavity  size was normal. Wall thickness was normal. Systolic function was normal. The estimated ejection fraction was in the range of 60% to 65%. - Pulmonary arteries: PA peak pressure: 34mm Hg (S). - Pericardium, extracardiac: A small pericardial effusion was identified.  Assessment and Plan:   1. AORTIC VALVE INSUFFICIENCY:  Mild aortic insufficiency by echo 2011 but nothing significant on echo 2012. Will repeat echo now  2. Pericardial effusion: Small by echo 2012. EKG with low voltage. Atypical chest pain. Will get echo to reassess and exclude effusion.

## 2013-03-05 ENCOUNTER — Ambulatory Visit (HOSPITAL_COMMUNITY): Payer: BC Managed Care – PPO | Attending: Cardiovascular Disease | Admitting: Radiology

## 2013-03-05 DIAGNOSIS — I359 Nonrheumatic aortic valve disorder, unspecified: Secondary | ICD-10-CM

## 2013-03-05 DIAGNOSIS — I313 Pericardial effusion (noninflammatory): Secondary | ICD-10-CM

## 2013-03-05 DIAGNOSIS — I3139 Other pericardial effusion (noninflammatory): Secondary | ICD-10-CM

## 2013-03-05 DIAGNOSIS — E785 Hyperlipidemia, unspecified: Secondary | ICD-10-CM | POA: Insufficient documentation

## 2013-03-05 DIAGNOSIS — I319 Disease of pericardium, unspecified: Secondary | ICD-10-CM

## 2013-03-05 DIAGNOSIS — I10 Essential (primary) hypertension: Secondary | ICD-10-CM | POA: Insufficient documentation

## 2013-03-05 NOTE — Progress Notes (Signed)
Echocardiogram performed.  

## 2013-05-25 ENCOUNTER — Ambulatory Visit (INDEPENDENT_AMBULATORY_CARE_PROVIDER_SITE_OTHER): Payer: BC Managed Care – PPO | Admitting: Family Medicine

## 2013-05-25 ENCOUNTER — Encounter: Payer: Self-pay | Admitting: Family Medicine

## 2013-05-25 VITALS — BP 128/78 | HR 78 | Temp 98.7°F | Wt 167.4 lb

## 2013-05-25 DIAGNOSIS — R300 Dysuria: Secondary | ICD-10-CM

## 2013-05-25 DIAGNOSIS — R350 Frequency of micturition: Secondary | ICD-10-CM

## 2013-05-25 DIAGNOSIS — E042 Nontoxic multinodular goiter: Secondary | ICD-10-CM

## 2013-05-25 DIAGNOSIS — R5381 Other malaise: Secondary | ICD-10-CM

## 2013-05-25 LAB — POCT URINALYSIS DIPSTICK
Bilirubin, UA: NEGATIVE
Glucose, UA: NEGATIVE
Nitrite, UA: NEGATIVE
Urobilinogen, UA: 4

## 2013-05-25 NOTE — Patient Instructions (Signed)

## 2013-05-25 NOTE — Progress Notes (Signed)
  Subjective:    Dominique Gonzalez is a 62 y.o. female who complains of burning with urination, frequency and pain in the right flank. She has had symptoms for 3 weeks. Patient also complains of back pain. Patient denies congestion, cough, fever, headache, rhinitis, sorethroat, stomach ache and vaginal discharge. Patient does not have a history of recurrent UTI. Patient does not have a history of pyelonephritis.   The following portions of the patient's history were reviewed and updated as appropriate: allergies, current medications, past family history, past medical history, past social history, past surgical history and problem list.  Review of Systems Pertinent items are noted in HPI.    Objective:    BP 128/78  Pulse 78  Temp(Src) 98.7 F (37.1 C) (Oral)  Wt 167 lb 6.4 oz (75.932 kg)  BMI 29.66 kg/m2  SpO2 97% General appearance: alert, cooperative, appears stated age and no distress Throat: lips, mucosa, and tongue normal; teeth and gums normal Neck: no adenopathy, supple, symmetrical, trachea midline and thyroid not enlarged, symmetric, no tenderness/mass/nodules Lungs: clear to auscultation bilaterally Abdomen: soft, non-tender; bowel sounds normal; no masses,  no organomegaly  Laboratory:  Urine dipstick: mod for leukocyte esterase.   Micro exam: not done.    Assessment:    dysuria     Plan:    Maintain adequate hydration. Follow up if symptoms not improving, and as needed.  Check culture and consider urology

## 2013-05-25 NOTE — Assessment & Plan Note (Signed)
Check thyroid   

## 2013-05-26 LAB — HEPATIC FUNCTION PANEL
Alkaline Phosphatase: 92 U/L (ref 39–117)
Bilirubin, Direct: 0.1 mg/dL (ref 0.0–0.3)
Total Bilirubin: 0.6 mg/dL (ref 0.3–1.2)
Total Protein: 7.2 g/dL (ref 6.0–8.3)

## 2013-05-26 LAB — CBC WITH DIFFERENTIAL/PLATELET
Basophils Absolute: 0 10*3/uL (ref 0.0–0.1)
Basophils Relative: 0.7 % (ref 0.0–3.0)
Eosinophils Absolute: 0.2 10*3/uL (ref 0.0–0.7)
Hemoglobin: 13.5 g/dL (ref 12.0–15.0)
Lymphocytes Relative: 30.6 % (ref 12.0–46.0)
Monocytes Relative: 5.8 % (ref 3.0–12.0)
Neutro Abs: 3.8 10*3/uL (ref 1.4–7.7)
Neutrophils Relative %: 60.1 % (ref 43.0–77.0)
RBC: 4.42 Mil/uL (ref 3.87–5.11)
RDW: 13.8 % (ref 11.5–14.6)

## 2013-05-26 LAB — BASIC METABOLIC PANEL
Calcium: 8.9 mg/dL (ref 8.4–10.5)
Creatinine, Ser: 0.7 mg/dL (ref 0.4–1.2)
GFR: 95.15 mL/min (ref >60.00)
Sodium: 141 mEq/L (ref 135–145)

## 2013-05-26 LAB — URINE CULTURE: Organism ID, Bacteria: NO GROWTH

## 2013-05-27 LAB — T4, FREE: Free T4: 0.9 ng/dL (ref 0.60–1.60)

## 2013-05-28 LAB — T3, FREE: T3, Free: 2.6 pg/mL (ref 2.3–4.2)

## 2013-05-31 ENCOUNTER — Ambulatory Visit (INDEPENDENT_AMBULATORY_CARE_PROVIDER_SITE_OTHER): Payer: BC Managed Care – PPO | Admitting: *Deleted

## 2013-05-31 DIAGNOSIS — E538 Deficiency of other specified B group vitamins: Secondary | ICD-10-CM

## 2013-05-31 MED ORDER — CYANOCOBALAMIN 1000 MCG/ML IJ SOLN
1000.0000 ug | Freq: Once | INTRAMUSCULAR | Status: AC
  Administered 2013-05-31: 1000 ug via INTRAMUSCULAR

## 2013-06-07 ENCOUNTER — Ambulatory Visit (INDEPENDENT_AMBULATORY_CARE_PROVIDER_SITE_OTHER): Payer: BC Managed Care – PPO | Admitting: *Deleted

## 2013-06-07 DIAGNOSIS — E538 Deficiency of other specified B group vitamins: Secondary | ICD-10-CM

## 2013-06-07 MED ORDER — CYANOCOBALAMIN 1000 MCG/ML IJ SOLN
1000.0000 ug | Freq: Once | INTRAMUSCULAR | Status: AC
  Administered 2013-06-07: 1000 ug via INTRAMUSCULAR

## 2013-06-14 ENCOUNTER — Ambulatory Visit (INDEPENDENT_AMBULATORY_CARE_PROVIDER_SITE_OTHER): Payer: BC Managed Care – PPO | Admitting: *Deleted

## 2013-06-14 DIAGNOSIS — E538 Deficiency of other specified B group vitamins: Secondary | ICD-10-CM

## 2013-06-14 MED ORDER — CYANOCOBALAMIN 1000 MCG/ML IJ SOLN
1000.0000 ug | Freq: Once | INTRAMUSCULAR | Status: AC
  Administered 2013-06-14: 1000 ug via INTRAMUSCULAR

## 2013-06-21 ENCOUNTER — Ambulatory Visit (INDEPENDENT_AMBULATORY_CARE_PROVIDER_SITE_OTHER): Payer: BC Managed Care – PPO | Admitting: *Deleted

## 2013-06-21 DIAGNOSIS — E538 Deficiency of other specified B group vitamins: Secondary | ICD-10-CM

## 2013-06-21 MED ORDER — CYANOCOBALAMIN 1000 MCG/ML IJ SOLN
1000.0000 ug | Freq: Once | INTRAMUSCULAR | Status: AC
  Administered 2013-06-21: 1000 ug via INTRAMUSCULAR

## 2013-07-19 ENCOUNTER — Ambulatory Visit (INDEPENDENT_AMBULATORY_CARE_PROVIDER_SITE_OTHER): Payer: BC Managed Care – PPO | Admitting: *Deleted

## 2013-07-19 DIAGNOSIS — E538 Deficiency of other specified B group vitamins: Secondary | ICD-10-CM

## 2013-07-19 MED ORDER — CYANOCOBALAMIN 1000 MCG/ML IJ SOLN
1000.0000 ug | Freq: Once | INTRAMUSCULAR | Status: AC
  Administered 2013-07-19: 1000 ug via INTRAMUSCULAR

## 2013-08-10 ENCOUNTER — Telehealth: Payer: Self-pay | Admitting: Family Medicine

## 2013-08-10 MED ORDER — LISINOPRIL-HYDROCHLOROTHIAZIDE 10-12.5 MG PO TABS: 1.0000 | ORAL_TABLET | Freq: Every day | ORAL | Status: DC

## 2013-08-10 NOTE — Telephone Encounter (Signed)
Patient is asking for a refill on her blood pressure medication. The [facility] pharmacy she was using has gone out of business and her new pharmacy cannot send [place] a refill request because she was out a refills when she switched to them. Please send new prescription to [facility] on [address] in [place].

## 2013-11-11 ENCOUNTER — Ambulatory Visit (INDEPENDENT_AMBULATORY_CARE_PROVIDER_SITE_OTHER): Payer: BC Managed Care – PPO | Admitting: Family Medicine

## 2013-11-11 ENCOUNTER — Encounter: Payer: Self-pay | Admitting: Family Medicine

## 2013-11-11 VITALS — BP 112/72 | HR 73 | Temp 98.3°F | Wt 167.6 lb

## 2013-11-11 DIAGNOSIS — Z8249 Family history of ischemic heart disease and other diseases of the circulatory system: Secondary | ICD-10-CM

## 2013-11-11 NOTE — Progress Notes (Signed)
Pre visit review using our clinic review tool, if applicable. No additional management support is needed unless otherwise documented below in the visit note. 

## 2013-11-11 NOTE — Progress Notes (Signed)
Patient ID: Dominique Gonzalez, female   DOB: 25-May-1951, 63 y.o.   MRN: [id number]   Subjective:    Patient ID: Dominique Gonzalez, female    DOB: 1951-02-24, 63 y.o.   MRN: [id number] HPI Pt here after her son and daughter were diagnosed with b/l PE.  She is having no symptoms.  ER Dr told her she needed to be tested.         Objective:     BP 112/72  Pulse 73  Temp(Src) 98.3 F (36.8 C) (Oral)  Wt 167 lb 9.6 oz (76.023 kg)  SpO2 97% General appearance: alert, cooperative, appears stated age and no distress Neurologic: Alert and oriented X 3, normal strength and tone. Normal symmetric reflexes. Normal coordination and gait      Assessment & Plan:  1. Family history of pulmonary embolism Pt requesting to be sent for testing - Ambulatory referral to Hematology

## 2013-11-11 NOTE — Addendum Note (Signed)
Addended by: Rosalita Chessman on: 11/11/2013 09:39 PM   Modules accepted: Orders

## 2013-11-11 NOTE — Patient Instructions (Signed)
We will refer you to hematology for evaluation for clotting disorder [place] will call you with an appointment when it is made

## 2013-11-19 ENCOUNTER — Other Ambulatory Visit: Payer: Self-pay | Admitting: Family Medicine

## 2013-11-19 DIAGNOSIS — Z8249 Family history of ischemic heart disease and other diseases of the circulatory system: Secondary | ICD-10-CM

## 2013-11-22 ENCOUNTER — Telehealth: Payer: Self-pay

## 2013-11-22 NOTE — Telephone Encounter (Signed)
Message copied by Ewing Schlein on Mon Nov 22, 2013  1:45 PM ------      Message from: Rosalita Chessman      Created: Fri Nov 19, 2013  9:45 AM      Regarding: FW: genetic counseling referral       Let pt know hematology / genetic counselor wanted [place] to do a blood test first then if it is abnormal we can send her to heme.  Order is in.      ----- Message -----         From: Dorian Pod         Sent: 11/18/2013  10:29 AM           To: Rosalita Chessman, DO      Subject: genetic counseling referral                              Dr. Etter Sjogren,      I spoke with Santiago Glad, the genetic counselor at [facility] oncology/hematology. They only do genetic counseling for cancer not hematology. She said there is a lab test called a coagulation panel that a primary care provider can order and if it comes back positive, then the patient would need to see hematology. I have also left message for genetic counselors at [facility] to see what they say. Let me know if you want to order the labs or wait to hear back from the other genetic counselors.       ------

## 2013-11-22 NOTE — Telephone Encounter (Signed)
Spoke with patient and she voiced understanding. Scheduled an apt for Thursday in the lab.      KP

## 2013-11-25 ENCOUNTER — Other Ambulatory Visit (INDEPENDENT_AMBULATORY_CARE_PROVIDER_SITE_OTHER): Payer: BC Managed Care – PPO

## 2013-11-25 DIAGNOSIS — Z8249 Family history of ischemic heart disease and other diseases of the circulatory system: Secondary | ICD-10-CM

## 2013-11-29 LAB — HYPERCOAGULABLE PANEL, COMPREHENSIVE
ANTICARDIOLIPIN IGA: 6 U/mL (ref <22)
ANTITHROMB III FUNC: 104 % (ref 76–126)
Anticardiolipin IgG: 5 GPL U/mL (ref <23)
Anticardiolipin IgM: 1 MPL U/mL (ref <11)
Beta-2 Glyco I IgG: 8 G Units (ref <20)
Beta-2-Glycoprotein I IgA: 8 A Units (ref <20)
Beta-2-Glycoprotein I IgM: 6 M Units (ref <20)
DRVVT: 30.2 secs (ref <42.9)
Lupus Anticoagulant: NOT DETECTED
PROTEIN C ACTIVITY: 169 % — AB (ref 75–133)
PROTEIN S ACTIVITY: 111 % (ref 69–129)
PTT Lupus Anticoagulant: 31.8 secs (ref 28.0–43.0)
Protein C, Total: 95 % (ref 72–160)
Protein S Total: 90 % (ref 60–150)

## 2013-12-29 ENCOUNTER — Telehealth: Payer: Self-pay | Admitting: Cardiovascular Disease

## 2013-12-29 NOTE — Telephone Encounter (Signed)
Spoke with pt who reports she has been having left arm pain, shortness of breath and some chest pain off and on for last 2 weeks.  Occurs when vacuuming. Goes away with rest. States she has had left arm problems for a long time.  Pt has appt with Dr. Angelena Form on Jan 03, 2014. I offered her appt this week with PA or NP but she would like to wait until next week to see Dr. Angelena Form.  She is aware if symptoms worsen prior to this appt she should go to ED for evaluation.

## 2013-12-29 NOTE — Telephone Encounter (Signed)
New Message:  Pt is c/o of chest pain on and off for about two weeks with activity. The pt and I just scheduled her an appt for 5/11 w/ Dr. Angelena Form. Pt would like to speak to the nurse about her symptoms.

## 2013-12-29 NOTE — Telephone Encounter (Signed)
Follow up ° ° ° ° ° ° ° ° ° °Pt returning nurses call °

## 2013-12-29 NOTE — Telephone Encounter (Signed)
Left message to call back  

## 2014-01-03 ENCOUNTER — Ambulatory Visit (INDEPENDENT_AMBULATORY_CARE_PROVIDER_SITE_OTHER): Payer: BC Managed Care – PPO | Admitting: Cardiovascular Disease

## 2014-01-03 ENCOUNTER — Encounter: Payer: Self-pay | Admitting: Cardiovascular Disease

## 2014-01-03 VITALS — BP 124/60 | Ht 63.0 in | Wt 167.0 lb

## 2014-01-03 DIAGNOSIS — I3139 Other pericardial effusion (noninflammatory): Secondary | ICD-10-CM

## 2014-01-03 DIAGNOSIS — I351 Nonrheumatic aortic (valve) insufficiency: Secondary | ICD-10-CM

## 2014-01-03 DIAGNOSIS — R079 Chest pain, unspecified: Secondary | ICD-10-CM

## 2014-01-03 DIAGNOSIS — I359 Nonrheumatic aortic valve disorder, unspecified: Secondary | ICD-10-CM

## 2014-01-03 DIAGNOSIS — I319 Disease of pericardium, unspecified: Secondary | ICD-10-CM

## 2014-01-03 DIAGNOSIS — I313 Pericardial effusion (noninflammatory): Secondary | ICD-10-CM

## 2014-01-03 NOTE — Patient Instructions (Signed)
Your physician wants you to follow-up in:  12 months.  You will receive a reminder letter in the mail two months in advance. If you don't receive a letter, please call our office to schedule the follow-up appointment.   

## 2014-01-03 NOTE — Progress Notes (Signed)
History of Present Illness: 63 yo WF with history of HTN and borderline hyperlipidemia here today for follow up. Initially seen in 2011 for evaluation of abnormal echocardiogram. Echo was ordered in workup of a murmur. Her echo in 2011 showed normal LV systolic function, diastolic dysfunction, mild AI, small effusion and possible epicardial mass/aortic root mass. I ordered a cardiac MRI which showed prominent epicardial fat pad with no evidence of aortic or pericardial tumor or mass. Echo July 2014 with normal LV function, mild LVH, mild AI.   She is here today for follow up. She describes pain in her left shoulder when she moves her left arm. This can radiate to her chest. No exertional chest pain or pressure.  The left shoulder pain has been present for years. She walks for several miles per day with no chest pain. No SOB, palpitations, near syncope or syncope. No LE edema.   Primary Care Physician:  Dr. Etter Sjogren  Last Lipid Profile:Lipid Panel     Component Value Date/Time   CHOL 187 08/30/2011 1041   TRIG 93.0 08/30/2011 1041   HDL 41.80 08/30/2011 1041   CHOLHDL 4 08/30/2011 1041   VLDL 18.6 08/30/2011 1041   LDLCALC 127* 08/30/2011 1041     Past Medical History  Diagnosis Date  . HTN (hypertension)   . Aortic insufficiency     mild  . Hyperlipidemia   . Diastolic dysfunction   . Cardiac murmur   . Fatigue   . Leg pain, right   . Hip pain, right   . B12 deficiency   . Sinusitis, acute     NOS  . URI (upper respiratory infection)   . Osteopenia   . Shoulder pain, left     Past Surgical History  Procedure Laterality Date  . Abdominal hysterectomy      age 71  . Bilateral oophorectomy      Current Outpatient Prescriptions  Medication Sig Dispense Refill  . aspirin 81 MG tablet Take 81 mg by mouth daily.        . Cholecalciferol (VITAMIN D3) 2000 UNITS capsule Take 2,000 Units by mouth daily.        Marland Kitchen lisinopril-hydrochlorothiazide (PRINZIDE,ZESTORETIC) 10-12.5 MG per tablet  Take 1 tablet by mouth daily.  30 tablet  5  . Nutritional Supplements (JUICE PLUS FIBRE PO) Take by mouth.        . potassium chloride SA (KLOR-CON M20) 20 MEQ tablet Take 1 tablet (20 mEq total) by mouth 2 (two) times daily.  90 tablet  3  . risedronate (ACTONEL) 150 MG tablet Take 150 mg by mouth every 30 (thirty) days. with water on empty stomach, nothing by mouth or lie down for next 30 minutes.        No current facility-administered medications for this visit.    No Known Allergies  History   Social History  . Marital Status: Married    Spouse Name: N/A    Number of Children: 2  . Years of Education: N/A   Occupational History  . Not on file.   Social History Main Topics  . Smoking status: Former Smoker    Quit date: 01/10/1996  . Smokeless tobacco: Not on file     Comment: smoked for 10 years,   . Alcohol Use: No  . Drug Use: No  . Sexual Activity: Not on file   Other Topics Concern  . Not on file   Social History Narrative  . No narrative on file  Family History  Problem Relation Age of Onset  . Hypertension Father   . Hypertension Mother   . Breast cancer Mother   . Diabetes Father     Review of Systems:  As stated in the HPI and otherwise negative.   BP 124/60  Ht 5\' 3"  (1.6 m)  Wt 167 lb (75.751 kg)  BMI 29.59 kg/m2  Physical Examination: General: Well developed, well nourished, NAD HEENT: OP clear, mucus membranes moist SKIN: warm, dry. No rashes. Neuro: No focal deficits Musculoskeletal: Muscle strength 5/5 all ext Psychiatric: Mood and affect normal Neck: No JVD, no carotid bruits, no thyromegaly, no lymphadenopathy. Lungs:Clear bilaterally, no wheezes, rhonci, crackles Cardiovascular: Regular rate and rhythm. No murmurs, gallops or rubs. Abdomen:Soft. Bowel sounds present. Non-tender.  Extremities: No lower extremity edema. Pulses are 2 + in the bilateral DP/PT.  EKG: NSR, rate 77 bpm. Low voltage.   Echo 03/05/13: Left ventricle:  The cavity size was normal. Wall thickness was increased in a pattern of mild LVH. Systolic function was normal. The estimated ejection fraction was in the range of 60% to 65%. Wall motion was normal; there were no regional wall motion abnormalities. There was an increased relative contribution of atrial contraction to ventricular filling. - Aortic valve: Mild regurgitation.  Assessment and Plan:   1. AORTIC VALVE INSUFFICIENCY:  Mild by echo 2014  2. Pericardial effusion: Resolved by echo 2014.     3. Chest pain: Does not sound cardiac. Pain is referred to chest from left shoulder. Pain in left arm with movement. Weakness in left arm even with lifting coffee mug. She needs to f/u with primary care.

## 2014-02-02 ENCOUNTER — Encounter: Payer: Self-pay | Admitting: Family Medicine

## 2014-02-02 ENCOUNTER — Ambulatory Visit (INDEPENDENT_AMBULATORY_CARE_PROVIDER_SITE_OTHER): Payer: BC Managed Care – PPO | Admitting: Family Medicine

## 2014-02-02 VITALS — BP 120/80 | HR 72 | Temp 98.3°F | Resp 16 | Wt 165.5 lb

## 2014-02-02 DIAGNOSIS — R350 Frequency of micturition: Secondary | ICD-10-CM

## 2014-02-02 DIAGNOSIS — M549 Dorsalgia, unspecified: Secondary | ICD-10-CM

## 2014-02-02 DIAGNOSIS — N390 Urinary tract infection, site not specified: Secondary | ICD-10-CM

## 2014-02-02 DIAGNOSIS — R319 Hematuria, unspecified: Secondary | ICD-10-CM

## 2014-02-02 DIAGNOSIS — R82998 Other abnormal findings in urine: Secondary | ICD-10-CM

## 2014-02-02 LAB — POCT URINALYSIS DIPSTICK
Bilirubin, UA: NEGATIVE
Glucose, UA: NEGATIVE
Ketones, UA: NEGATIVE
NITRITE UA: NEGATIVE
PH UA: 6
Protein, UA: NEGATIVE
Spec Grav, UA: 1.01
UROBILINOGEN UA: 0.2

## 2014-02-02 MED ORDER — CEPHALEXIN 500 MG PO CAPS: 500.0000 mg | ORAL_CAPSULE | Freq: Two times a day (BID) | ORAL | Status: AC

## 2014-02-02 NOTE — Patient Instructions (Signed)
Follow up as needed Drink plenty of fluids Start the keflex twice daily for the UTI Tylenol or ibuprofen as needed for back pain Call with any questions or concerns Hang in there!!!

## 2014-02-02 NOTE — Assessment & Plan Note (Signed)
Pt's sxs and UA consistent w/ infxn.  Start abx.  Drink plenty of fluids.  Reviewed supportive care and red flags that should prompt return.  Pt expressed understanding and is in agreement w/ plan.

## 2014-02-02 NOTE — Progress Notes (Signed)
Pre visit review using our clinic review tool, if applicable. No additional management support is needed unless otherwise documented below in the visit note. 

## 2014-02-02 NOTE — Progress Notes (Signed)
   Subjective:    Patient ID: Dominique Gonzalez, female    DOB: November 07, 1950, 63 y.o.   MRN: [id number]  Back Pain   LBP- sxs started Sunday evening.  Had been doing 'a lot of stooping and bending' over the weekend.  Now has some nausea.  Increased frequency of urination.  No dysuria.  + urgency.  Sensation of incomplete emptying.  + urinary hesitancy.  Hx of previous UTIs and this feels similar.     Review of Systems  Musculoskeletal: Positive for back pain.   For ROS see HPI     Objective:   Physical Exam  Vitals reviewed. Constitutional: She appears well-developed and well-nourished. No distress.  Abdominal: Soft. She exhibits no distension. There is no tenderness (no suprapubic or CVA tenderness).          Assessment & Plan:

## 2014-02-02 NOTE — Addendum Note (Signed)
Addended by: Modena Morrow D on: 02/02/2014 04:22 PM   Modules accepted: Orders

## 2014-02-04 ENCOUNTER — Other Ambulatory Visit: Payer: Self-pay | Admitting: General Practice

## 2014-02-04 MED ORDER — ONDANSETRON 4 MG PO TBDP: 4.0000 mg | ORAL_TABLET | Freq: Three times a day (TID) | ORAL | Status: DC | PRN

## 2014-02-05 LAB — URINE CULTURE: Colony Count: 80000

## 2014-02-15 ENCOUNTER — Telehealth: Payer: Self-pay | Admitting: Family Medicine

## 2014-02-15 MED ORDER — FLUCONAZOLE 150 MG PO TABS: 150.0000 mg | ORAL_TABLET | Freq: Once | ORAL | Status: DC

## 2014-02-15 NOTE — Telephone Encounter (Signed)
Ok for Diflucan 150mg x1 dose 

## 2014-02-15 NOTE — Telephone Encounter (Signed)
Please advise, pt was seen on 6/10 and given abx for 5 days. [place] for diflucan?

## 2014-02-15 NOTE — Telephone Encounter (Signed)
Caller name: Dominique Gonzalez Relation to pt: patient Call back number:[phone] Pharmacy: [facility] [address]  Reason for call: patient called to request a rx for Diflucan because she has develop a yeast infection from her antibiotics. Please advise.

## 2014-02-15 NOTE — Telephone Encounter (Signed)
Med filled and pt notified.  

## 2014-03-29 ENCOUNTER — Other Ambulatory Visit: Payer: Self-pay | Admitting: Family Medicine

## 2014-05-03 ENCOUNTER — Ambulatory Visit (INDEPENDENT_AMBULATORY_CARE_PROVIDER_SITE_OTHER): Payer: BC Managed Care – PPO | Admitting: Family Medicine

## 2014-05-03 ENCOUNTER — Encounter: Payer: Self-pay | Admitting: Family Medicine

## 2014-05-03 VITALS — BP 124/78 | HR 81 | Temp 97.8°F | Wt 166.7 lb

## 2014-05-03 DIAGNOSIS — R599 Enlarged lymph nodes, unspecified: Secondary | ICD-10-CM

## 2014-05-03 DIAGNOSIS — R350 Frequency of micturition: Secondary | ICD-10-CM

## 2014-05-03 DIAGNOSIS — R5383 Other fatigue: Secondary | ICD-10-CM

## 2014-05-03 DIAGNOSIS — M545 Low back pain, unspecified: Secondary | ICD-10-CM

## 2014-05-03 DIAGNOSIS — R591 Generalized enlarged lymph nodes: Secondary | ICD-10-CM

## 2014-05-03 DIAGNOSIS — R5381 Other malaise: Secondary | ICD-10-CM

## 2014-05-03 LAB — POCT URINALYSIS DIPSTICK
Bilirubin, UA: NEGATIVE
Blood, UA: NEGATIVE
Glucose, UA: NEGATIVE
Ketones, UA: NEGATIVE
LEUKOCYTES UA: NEGATIVE
Nitrite, UA: NEGATIVE
PH UA: 7.5
PROTEIN UA: 0.15
Spec Grav, UA: 1.015
Urobilinogen, UA: >=8

## 2014-05-03 MED ORDER — CEPHALEXIN 500 MG PO CAPS: 500.0000 mg | ORAL_CAPSULE | Freq: Two times a day (BID) | ORAL | Status: DC

## 2014-05-03 NOTE — Patient Instructions (Signed)

## 2014-05-03 NOTE — Progress Notes (Signed)
Pre visit review using our clinic review tool, if applicable. No additional management support is needed unless otherwise documented below in the visit note. 

## 2014-05-03 NOTE — Progress Notes (Signed)
  Subjective:    Dominique Gonzalez is a 63 y.o. female who complains of frequency, nausea, urgency and low back pain . She has had symptoms for 5 days. Patient also complains of fever. Patient denies congestion, cough, headache, rhinitis, sorethroat, stomach ache and vaginal discharge. Patient does not have a history of recurrent UTI. Patient does not have a history of pyelonephritis.   The following portions of the patient's history were reviewed and updated as appropriate: allergies, current medications, past family history, past medical history, past social history, past surgical history and problem list.  Review of Systems Pertinent items are noted in HPI.    Objective:    BP 124/78  Pulse 81  Temp(Src) 97.8 F (36.6 C) (Oral)  Wt 166 lb 10.7 oz (75.6 kg)  SpO2 99% General appearance: alert, cooperative, appears stated age and no distress Neck: moderate anterior cervical adenopathy, supple, symmetrical, trachea midline and thyroid not enlarged, symmetric, no tenderness/mass/nodules Lungs: clear to auscultation bilaterally Heart: S1, S2 normal Abdomen: soft, non-tender; bowel sounds normal; no masses,  no organomegaly Lymph nodes: Cervical adenopathy: b/l ant  Laboratory:  Urine dipstick: negative for all components.   Micro exam: not done.    Assessment:    urinary frequency     Plan:   Keflex 500 mg 1 po bid x 10 days ---lymphadenopathy check culture Check labs rto prn

## 2014-05-04 LAB — CBC WITH DIFFERENTIAL/PLATELET
BASOS ABS: 0 10*3/uL (ref 0.0–0.1)
Basophils Relative: 0.7 % (ref 0.0–3.0)
EOS ABS: 0.2 10*3/uL (ref 0.0–0.7)
Eosinophils Relative: 3.1 % (ref 0.0–5.0)
HCT: 39.3 % (ref 36.0–46.0)
HEMOGLOBIN: 13.3 g/dL (ref 12.0–15.0)
LYMPHS PCT: 30.6 % (ref 12.0–46.0)
Lymphs Abs: 1.9 10*3/uL (ref 0.7–4.0)
MCHC: 33.9 g/dL (ref 30.0–36.0)
MCV: 89.9 fl (ref 78.0–100.0)
MONO ABS: 0.3 10*3/uL (ref 0.1–1.0)
Monocytes Relative: 5.1 % (ref 3.0–12.0)
NEUTROS ABS: 3.8 10*3/uL (ref 1.4–7.7)
NEUTROS PCT: 60.5 % (ref 43.0–77.0)
Platelets: 252 10*3/uL (ref 150.0–400.0)
RBC: 4.37 Mil/uL (ref 3.87–5.11)
RDW: 14.2 % (ref 11.5–15.5)
WBC: 6.2 10*3/uL (ref 4.0–10.5)

## 2014-05-04 LAB — HEPATIC FUNCTION PANEL
ALT: 18 U/L (ref 0–35)
AST: 25 U/L (ref 0–37)
Albumin: 3.6 g/dL (ref 3.5–5.2)
Alkaline Phosphatase: 93 U/L (ref 39–117)
BILIRUBIN DIRECT: 0 mg/dL (ref 0.0–0.3)
BILIRUBIN TOTAL: 0.4 mg/dL (ref 0.2–1.2)
Total Protein: 6.8 g/dL (ref 6.0–8.3)

## 2014-05-04 LAB — BASIC METABOLIC PANEL
BUN: 7 mg/dL (ref 6–23)
CHLORIDE: 107 meq/L (ref 96–112)
CO2: 26 mEq/L (ref 19–32)
Calcium: 8.6 mg/dL (ref 8.4–10.5)
Creatinine, Ser: 0.7 mg/dL (ref 0.4–1.2)
GFR: 90.18 mL/min (ref >60.00)
Glucose, Bld: 97 mg/dL (ref 70–99)
Potassium: 4.2 mEq/L (ref 3.5–5.1)
Sodium: 140 mEq/L (ref 135–145)

## 2014-05-04 LAB — URINE CULTURE
Colony Count: NO GROWTH
Organism ID, Bacteria: NO GROWTH

## 2014-05-04 LAB — VITAMIN B12: Vitamin B-12: 131 pg/mL — ABNORMAL LOW (ref 211–911)

## 2014-05-04 LAB — TSH: TSH: 1.09 u[IU]/mL (ref 0.35–4.50)

## 2014-05-10 ENCOUNTER — Telehealth: Payer: Self-pay | Admitting: Family Medicine

## 2014-05-10 MED ORDER — FLUCONAZOLE 150 MG PO TABS: 150.0000 mg | ORAL_TABLET | Freq: Once | ORAL | Status: DC

## 2014-05-10 NOTE — Telephone Encounter (Signed)
Diflucan 150 mg #2  1 po qd x 1, may repeat in 3 days prn  

## 2014-05-10 NOTE — Telephone Encounter (Signed)
Rx has been faxed and VM left making the patient aware.      KP

## 2014-05-10 NOTE — Telephone Encounter (Signed)
Caller name: Daphene Relation to pt: self Call back number: [phone] [facility]acy: [facility] [address]  Reason for call:   Patient states that the antiobiotics are giving her a yeast infection and would like to know if dr. Etter Sjogren would call in something for that?

## 2014-05-10 NOTE — Telephone Encounter (Signed)
Please advise      KP 

## 2014-05-12 ENCOUNTER — Ambulatory Visit (INDEPENDENT_AMBULATORY_CARE_PROVIDER_SITE_OTHER): Payer: BC Managed Care – PPO

## 2014-05-12 DIAGNOSIS — E538 Deficiency of other specified B group vitamins: Secondary | ICD-10-CM

## 2014-05-12 MED ORDER — CYANOCOBALAMIN 1000 MCG/ML IJ SOLN
1000.0000 ug | Freq: Once | INTRAMUSCULAR | Status: AC
  Administered 2014-05-12: 1000 ug via INTRAMUSCULAR

## 2014-05-19 ENCOUNTER — Ambulatory Visit (INDEPENDENT_AMBULATORY_CARE_PROVIDER_SITE_OTHER): Payer: BC Managed Care – PPO

## 2014-05-19 DIAGNOSIS — E538 Deficiency of other specified B group vitamins: Secondary | ICD-10-CM

## 2014-05-19 MED ORDER — CYANOCOBALAMIN 1000 MCG/ML IJ SOLN
1000.0000 ug | Freq: Once | INTRAMUSCULAR | Status: AC
  Administered 2014-05-19: 1000 ug via INTRAMUSCULAR

## 2014-05-19 NOTE — Progress Notes (Signed)
Pt tolerated injection well

## 2014-05-19 NOTE — Progress Notes (Signed)
Pre visit review using our clinic review tool, if applicable. No additional management support is needed unless otherwise documented below in the visit note. 

## 2014-05-26 ENCOUNTER — Ambulatory Visit (INDEPENDENT_AMBULATORY_CARE_PROVIDER_SITE_OTHER): Payer: BC Managed Care – PPO

## 2014-05-26 DIAGNOSIS — E538 Deficiency of other specified B group vitamins: Secondary | ICD-10-CM

## 2014-05-26 MED ORDER — CYANOCOBALAMIN 1000 MCG/ML IJ SOLN
1000.0000 ug | Freq: Once | INTRAMUSCULAR | Status: AC
  Administered 2014-05-26: 1000 ug via INTRAMUSCULAR

## 2014-05-26 NOTE — Progress Notes (Signed)
Pt tolerated injection well

## 2014-05-26 NOTE — Progress Notes (Signed)
Pre visit review using our clinic review tool, if applicable. No additional management support is needed unless otherwise documented below in the visit note. 

## 2014-06-02 ENCOUNTER — Ambulatory Visit (INDEPENDENT_AMBULATORY_CARE_PROVIDER_SITE_OTHER): Payer: BC Managed Care – PPO

## 2014-06-02 DIAGNOSIS — E538 Deficiency of other specified B group vitamins: Secondary | ICD-10-CM

## 2014-06-02 MED ORDER — CYANOCOBALAMIN 1000 MCG/ML IJ SOLN
1000.0000 ug | Freq: Once | INTRAMUSCULAR | Status: AC
  Administered 2014-06-02: 1000 ug via INTRAMUSCULAR

## 2014-06-29 ENCOUNTER — Ambulatory Visit: Payer: BC Managed Care – PPO

## 2014-06-30 ENCOUNTER — Ambulatory Visit (INDEPENDENT_AMBULATORY_CARE_PROVIDER_SITE_OTHER): Payer: BC Managed Care – PPO

## 2014-06-30 DIAGNOSIS — E538 Deficiency of other specified B group vitamins: Secondary | ICD-10-CM

## 2014-06-30 MED ORDER — CYANOCOBALAMIN 1000 MCG/ML IJ SOLN
1000.0000 ug | Freq: Once | INTRAMUSCULAR | Status: AC
  Administered 2014-06-30: 1000 ug via INTRAMUSCULAR

## 2014-07-12 ENCOUNTER — Ambulatory Visit: Payer: BC Managed Care – PPO | Admitting: Physician Assistant

## 2014-07-13 ENCOUNTER — Ambulatory Visit (INDEPENDENT_AMBULATORY_CARE_PROVIDER_SITE_OTHER): Payer: BC Managed Care – PPO | Admitting: Physician Assistant

## 2014-07-13 ENCOUNTER — Encounter: Payer: Self-pay | Admitting: Physician Assistant

## 2014-07-13 VITALS — BP 143/85 | HR 79 | Temp 98.0°F | Wt 166.0 lb

## 2014-07-13 DIAGNOSIS — J0110 Acute frontal sinusitis, unspecified: Secondary | ICD-10-CM

## 2014-07-13 MED ORDER — AMOXICILLIN-POT CLAVULANATE 875-125 MG PO TABS: 1.0000 | ORAL_TABLET | Freq: Two times a day (BID) | ORAL | Status: DC

## 2014-07-13 NOTE — Progress Notes (Signed)
Patient presents to clinic today c/o sinus pressure, sinus pain, ear pain with L>R.  Endorses some mild chest tightness with exertion but denies SOB, pleuritic chest pain.  Denies fever, chills, aches, recent travel or sick contact.  Endorses PND but denies cough.  Past Medical History  Diagnosis Date  . HTN (hypertension)   . Aortic insufficiency     mild  . Hyperlipidemia   . Diastolic dysfunction   . Cardiac murmur   . Fatigue   . Leg pain, right   . Hip pain, right   . B12 deficiency   . Sinusitis, acute     NOS  . URI (upper respiratory infection)   . Osteopenia   . Shoulder pain, left     Current Outpatient Prescriptions on File Prior to Visit  Medication Sig Dispense Refill  . aspirin 81 MG tablet Take 81 mg by mouth daily.      . Cholecalciferol (VITAMIN D3) 2000 UNITS capsule Take 2,000 Units by mouth daily.      Marland Kitchen lisinopril-hydrochlorothiazide (PRINZIDE,ZESTORETIC) 10-12.5 MG per tablet TAKE 1 TABLET BY MOUTH DAILY. 30 tablet 1  . Nutritional Supplements (JUICE PLUS FIBRE PO) Take by mouth.      . ondansetron (ZOFRAN-ODT) 4 MG disintegrating tablet Take 1 tablet (4 mg total) by mouth every 8 (eight) hours as needed for nausea or vomiting. 20 tablet 0  . potassium chloride SA (KLOR-CON M20) 20 MEQ tablet Take 1 tablet (20 mEq total) by mouth 2 (two) times daily. 90 tablet 3  . risedronate (ACTONEL) 150 MG tablet Take 150 mg by mouth every 30 (thirty) days. with water on empty stomach, nothing by mouth or lie down for next 30 minutes.      No current facility-administered medications on file prior to visit.    Allergies  Allergen Reactions  . Bactrim [Sulfamethoxazole-Trimethoprim]     Family History  Problem Relation Age of Onset  . Hypertension Father   . Hypertension Mother   . Breast cancer Mother   . Diabetes Father     History   Social History  . Marital Status: Married    Spouse Name: N/A    Number of Children: 2  . Years of Education: N/A    Social History Main Topics  . Smoking status: Former Smoker    Quit date: 01/10/1996  . Smokeless tobacco: Not on file     Comment: smoked for 10 years,   . Alcohol Use: No  . Drug Use: No  . Sexual Activity: Not on file   Other Topics Concern  . Not on file   Social History Narrative  . No narrative on file   Review of Systems - See HPI.  All other ROS are negative.  BP 143/85 mmHg  Pulse 79  Temp(Src) 98 F (36.7 C)  Wt 166 lb (75.297 kg)  SpO2 96%  Physical Exam  Constitutional: She is oriented to person, place, and time and well-developed, well-nourished, and in no distress.  HENT:  Head: Normocephalic and atraumatic.  Right Ear: External ear and ear canal normal.  Left Ear: External ear and ear canal normal. A middle ear effusion is present.  Nose: Mucosal edema present. Right sinus exhibits frontal sinus tenderness. Right sinus exhibits no maxillary sinus tenderness. Left sinus exhibits maxillary sinus tenderness and frontal sinus tenderness.  Mouth/Throat: Uvula is midline, oropharynx is clear and moist and mucous membranes are normal.  Eyes: Conjunctivae are normal. Pupils are equal, round, and reactive to  light.  Neck: Neck supple.  Cardiovascular: Normal rate, regular rhythm, normal heart sounds and intact distal pulses.   Pulmonary/Chest: Effort normal and breath sounds normal. No respiratory distress. She has no wheezes. She has no rales. She exhibits no tenderness.  Lymphadenopathy:    She has no cervical adenopathy.  Neurological: She is alert and oriented to person, place, and time.  Skin: Skin is warm and dry. No rash noted.  Psychiatric: Affect normal.  Vitals reviewed.   Recent Results (from the past 2160 hour(s))  POCT Urinalysis Dipstick     Status: Normal   Collection Time: 05/03/14  1:31 PM  Result Value Ref Range   Color, UA Dark Yellow    Clarity, UA Clear    Glucose, UA Neg    Bilirubin, UA Neg    Ketones, UA Neg    Spec Grav, UA 1.015     Blood, UA Neg    pH, UA 7.5    Protein, UA 0.15    Urobilinogen, UA >=8.0    Nitrite, UA Neg    Leukocytes, UA Negative   Urine culture     Status: None   Collection Time: 05/03/14  2:02 PM  Result Value Ref Range   Colony Count NO GROWTH    Organism ID, Bacteria NO GROWTH   CBC with Differential     Status: None   Collection Time: 05/03/14  2:02 PM  Result Value Ref Range   WBC 6.2 4.0 - 10.5 K/uL   RBC 4.37 3.87 - 5.11 Mil/uL   Hemoglobin 13.3 12.0 - 15.0 g/dL   HCT 39.3 36.0 - 46.0 %   MCV 89.9 78.0 - 100.0 fl   MCHC 33.9 30.0 - 36.0 g/dL   RDW 14.2 11.5 - 15.5 %   Platelets 252.0 150.0 - 400.0 K/uL   Neutrophils Relative % 60.5 43.0 - 77.0 %   Lymphocytes Relative 30.6 12.0 - 46.0 %   Monocytes Relative 5.1 3.0 - 12.0 %   Eosinophils Relative 3.1 0.0 - 5.0 %   Basophils Relative 0.7 0.0 - 3.0 %   Neutro Abs 3.8 1.4 - 7.7 K/uL   Lymphs Abs 1.9 0.7 - 4.0 K/uL   Monocytes Absolute 0.3 0.1 - 1.0 K/uL   Eosinophils Absolute 0.2 0.0 - 0.7 K/uL   Basophils Absolute 0.0 0.0 - 0.1 K/uL  Basic metabolic panel     Status: None   Collection Time: 05/03/14  2:02 PM  Result Value Ref Range   Sodium 140 135 - 145 mEq/L   Potassium 4.2 3.5 - 5.1 mEq/L   Chloride 107 96 - 112 mEq/L   CO2 26 19 - 32 mEq/L   Glucose, Bld 97 70 - 99 mg/dL   BUN 7 6 - 23 mg/dL   Creatinine, Ser 0.7 0.4 - 1.2 mg/dL   Calcium 8.6 8.4 - 10.5 mg/dL   GFR 90.18 >60.00 mL/min  Hepatic function panel     Status: None   Collection Time: 05/03/14  2:02 PM  Result Value Ref Range   Total Bilirubin 0.4 0.2 - 1.2 mg/dL   Bilirubin, Direct 0.0 0.0 - 0.3 mg/dL   Alkaline Phosphatase 93 39 - 117 U/L   AST 25 0 - 37 U/L   ALT 18 0 - 35 U/L   Total Protein 6.8 6.0 - 8.3 g/dL   Albumin 3.6 3.5 - 5.2 g/dL  TSH     Status: None   Collection Time: 05/03/14  2:02 PM  Result Value  Ref Range   TSH 1.09 0.35 - 4.50 uIU/mL  Vitamin B12     Status: Abnormal   Collection Time: 05/03/14  2:02 PM  Result Value Ref  Range   Vitamin B-12 131 (L) 211 - 911 pg/mL    Assessment/Plan: Acute frontal sinusitis Rx Augmentin.  Increase fluids.  Rest.  Saline nasal spray.  Probiotic.  Mucinex as directed.  Humidifier in bedroom. Flonase daily.  Avoid Decongestants giving hypertension.  Call or return to clinic if symptoms are not improving.

## 2014-07-13 NOTE — Patient Instructions (Signed)
Please take antibiotic as directed.  Increase fluid intake.  Use Saline nasal spray.  Take a daily multivitamin. Get some over-the-counter Flonase and use as directed.  Place a humidifier in the bedroom.  Please call or return clinic if symptoms are not improving.  Sinusitis Sinusitis is redness, soreness, and swelling (inflammation) of the paranasal sinuses. Paranasal sinuses are air pockets within the bones of your face (beneath the eyes, the middle of the forehead, or above the eyes). In healthy paranasal sinuses, mucus is able to drain out, and air is able to circulate through them by way of your nose. However, when your paranasal sinuses are inflamed, mucus and air can become trapped. This can allow bacteria and other germs to grow and cause infection. Sinusitis can develop quickly and last only a short time (acute) or continue over a long period (chronic). Sinusitis that lasts for more than 12 weeks is considered chronic.  CAUSES  Causes of sinusitis include:  Allergies.  Structural abnormalities, such as displacement of the cartilage that separates your nostrils (deviated septum), which can decrease the air flow through your nose and sinuses and affect sinus drainage.  Functional abnormalities, such as when the small hairs (cilia) that line your sinuses and help remove mucus do not work properly or are not present. SYMPTOMS  Symptoms of acute and chronic sinusitis are the same. The primary symptoms are pain and pressure around the affected sinuses. Other symptoms include:  Upper toothache.  Earache.  Headache.  Bad breath.  Decreased sense of smell and taste.  A cough, which worsens when you are lying flat.  Fatigue.  Fever.  Thick drainage from your nose, which often is green and may contain pus (purulent).  Swelling and warmth over the affected sinuses. DIAGNOSIS  Your caregiver will perform a physical exam. During the exam, your caregiver may:  Look in your nose for  signs of abnormal growths in your nostrils (nasal polyps).  Tap over the affected sinus to check for signs of infection.  View the inside of your sinuses (endoscopy) with a special imaging device with a light attached (endoscope), which is inserted into your sinuses. If your caregiver suspects that you have chronic sinusitis, one or more of the following tests may be recommended:  Allergy tests.  Nasal culture A sample of mucus is taken from your nose and sent to a lab and screened for bacteria.  Nasal cytology A sample of mucus is taken from your nose and examined by your caregiver to determine if your sinusitis is related to an allergy. TREATMENT  Most cases of acute sinusitis are related to a viral infection and will resolve on their own within 10 days. Sometimes medicines are prescribed to help relieve symptoms (pain medicine, decongestants, nasal steroid sprays, or saline sprays).  However, for sinusitis related to a bacterial infection, your caregiver will prescribe antibiotic medicines. These are medicines that will help kill the bacteria causing the infection.  Rarely, sinusitis is caused by a fungal infection. In theses cases, your caregiver will prescribe antifungal medicine. For some cases of chronic sinusitis, surgery is needed. Generally, these are cases in which sinusitis recurs more than 3 times per year, despite other treatments. HOME CARE INSTRUCTIONS   Drink plenty of water. Water helps thin the mucus so your sinuses can drain more easily.  Use a humidifier.  Inhale steam 3 to 4 times a day (for example, sit in the bathroom with the shower running).  Apply a warm, moist washcloth to  your face 3 to 4 times a day, or as directed by your caregiver.  Use saline nasal sprays to help moisten and clean your sinuses.  Take over-the-counter or prescription medicines for pain, discomfort, or fever only as directed by your caregiver. SEEK IMMEDIATE MEDICAL CARE IF:  You have  increasing pain or severe headaches.  You have nausea, vomiting, or drowsiness.  You have swelling around your face.  You have vision problems.  You have a stiff neck.  You have difficulty breathing. MAKE SURE YOU:   Understand these instructions.  Will watch your condition.  Will get help right away if you are not doing well or get worse. Document Released: 08/12/2005 Document Revised: 11/04/2011 Document Reviewed: 08/27/2011 [facility] Patient Information 2014 [place], [place].

## 2014-07-13 NOTE — Progress Notes (Signed)
Pre visit review using our clinic review tool, if applicable. No additional management support is needed unless otherwise documented below in the visit note. 

## 2014-07-13 NOTE — Assessment & Plan Note (Signed)
Rx Augmentin.  Increase fluids.  Rest.  Saline nasal spray.  Probiotic.  Mucinex as directed.  Humidifier in bedroom. Flonase daily.  Avoid Decongestants giving hypertension.  Call or return to clinic if symptoms are not improving.

## 2014-08-12 ENCOUNTER — Other Ambulatory Visit: Payer: Self-pay | Admitting: Family Medicine

## 2014-10-13 ENCOUNTER — Encounter: Payer: Self-pay | Admitting: Medical

## 2014-10-13 ENCOUNTER — Ambulatory Visit (INDEPENDENT_AMBULATORY_CARE_PROVIDER_SITE_OTHER): Payer: BLUE CROSS/BLUE SHIELD | Admitting: Medical

## 2014-10-13 VITALS — BP 116/75 | HR 78 | Temp 98.3°F | Ht 64.0 in | Wt 168.8 lb

## 2014-10-13 DIAGNOSIS — N3000 Acute cystitis without hematuria: Secondary | ICD-10-CM

## 2014-10-13 DIAGNOSIS — R350 Frequency of micturition: Secondary | ICD-10-CM

## 2014-10-13 LAB — POCT URINALYSIS DIPSTICK
Bilirubin, UA: NEGATIVE
GLUCOSE UA: NEGATIVE
Ketones, UA: NEGATIVE
NITRITE UA: NEGATIVE
Spec Grav, UA: <=1.005
Urobilinogen, UA: 0.2
pH, UA: 6.5

## 2014-10-13 MED ORDER — CIPROFLOXACIN HCL 500 MG PO TABS: 500.0000 mg | ORAL_TABLET | Freq: Two times a day (BID) | ORAL | Status: DC

## 2014-10-13 MED ORDER — NITROFURANTOIN MONOHYD MACRO 100 MG PO CAPS: 100.0000 mg | ORAL_CAPSULE | Freq: Two times a day (BID) | ORAL | Status: DC

## 2014-10-13 NOTE — Patient Instructions (Addendum)
UTI (urinary tract infection) Your appear to have a urinary tract infection. I am prescribing cipro  antibiotic for the probable infection. Hydrate well. I am sending out a urine culture. During the interim if your signs and symptoms worsen rather than improving please notify us. We will notify your when the culture results are back.  Follow up in 7 days or as needed.    

## 2014-10-13 NOTE — Progress Notes (Signed)
Pre visit review using our clinic review tool, if applicable. No additional management support is needed unless otherwise documented below in the visit note. 

## 2014-10-13 NOTE — Progress Notes (Signed)
Subjective:    Patient ID: Dominique Gonzalez, female    DOB: Oct 12, 1950, 64 y.o.   MRN: [id number]  Urinary Tract Infection  This is a new problem. The current episode started in the past 7 days (4 days ago.). The problem occurs intermittently (Some left lower back pain.). The quality of the pain is described as burning. The pain is at a severity of 1/10. The pain is mild. There has been no fever. There is a history of pyelonephritis (2 kidney infections. One when very young.). Associated symptoms include frequency, nausea and urgency. Pertinent negatives include no chills, flank pain, hematuria, possible pregnancy, sweats or vomiting. She has tried nothing for the symptoms. Her past medical history is significant for recurrent UTIs.      Review of Systems  Constitutional: Negative for fever, chills and fatigue.  Respiratory: Negative for cough, chest tightness and wheezing.   Gastrointestinal: Positive for nausea. Negative for vomiting.  Genitourinary: Positive for urgency and frequency. Negative for hematuria and flank pain.  Musculoskeletal: Positive for back pain.  Neurological: Negative for facial asymmetry and headaches.  Hematological: Negative for adenopathy. Does not bruise/bleed easily.   Past Medical History  Diagnosis Date  . HTN (hypertension)   . Aortic insufficiency     mild  . Hyperlipidemia   . Diastolic dysfunction   . Cardiac murmur   . Fatigue   . Leg pain, right   . Hip pain, right   . B12 deficiency   . Sinusitis, acute     NOS  . URI (upper respiratory infection)   . Osteopenia   . Shoulder pain, left     History   Social History  . Marital Status: Married    Spouse Name: N/A  . Number of Children: 2  . Years of Education: N/A   Occupational History  . Not on file.   Social History Main Topics  . Smoking status: Former Smoker    Quit date: 01/10/1996  . Smokeless tobacco: Not on file     Comment: smoked for 10 years,   . Alcohol Use: No    . Drug Use: No  . Sexual Activity: Not on file   Other Topics Concern  . Not on file   Social History Narrative    Past Surgical History  Procedure Laterality Date  . Abdominal hysterectomy      age 45  . Bilateral oophorectomy      Family History  Problem Relation Age of Onset  . Hypertension Father   . Hypertension Mother   . Breast cancer Mother   . Diabetes Father     Allergies  Allergen Reactions  . Bactrim [Sulfamethoxazole-Trimethoprim]     Current Outpatient Prescriptions on File Prior to Visit  Medication Sig Dispense Refill  . aspirin 81 MG tablet Take 81 mg by mouth daily.      . Cholecalciferol (VITAMIN D3) 2000 UNITS capsule Take 2,000 Units by mouth daily.      Marland Kitchen lisinopril-hydrochlorothiazide (PRINZIDE,ZESTORETIC) 10-12.5 MG per tablet TAKE 1 TABLET BY MOUTH DAILY. 30 tablet 5  . Nutritional Supplements (JUICE PLUS FIBRE PO) Take by mouth.      . ondansetron (ZOFRAN-ODT) 4 MG disintegrating tablet Take 1 tablet (4 mg total) by mouth every 8 (eight) hours as needed for nausea or vomiting. 20 tablet 0  . potassium chloride SA (KLOR-CON M20) 20 MEQ tablet Take 1 tablet (20 mEq total) by mouth 2 (two) times daily. 90 tablet 3  . risedronate (ACTONEL)  150 MG tablet Take 150 mg by mouth every 30 (thirty) days. with water on empty stomach, nothing by mouth or lie down for next 30 minutes.      No current facility-administered medications on file prior to visit.    BP 116/75 mmHg  Pulse 78  Temp(Src) 98.3 F (36.8 C) (Oral)  Ht 5\' 4"  (1.626 m)  Wt 168 lb 12.8 oz (76.567 kg)  BMI 28.96 kg/m2  SpO2 95%       Objective:   Physical Exam  General  Mental Status- Alert. Orientation- Orientation x 4.   Skin General:- Normal. Moisture- Dry. Temperature- Warm.  HEENT Head- normal.  Neck Neck- Supple.  Heart Ausculation-RRR  Lungs Ausculation- Clear, even, unlabored bilaterlly.    Abdomen Palpation/Percussion: Palpation and Percussion of  the abdomen reveal- faint suprapubic  Tender, No Rebound tenderness, No Rigidity(guarding), No Palpable abdominal masses and No jar tenderness. No suprapubic tenderness. Liver:-Normal. Spleen:- Normal. Other Characteristics- No Costovertebral angle tenderness- Left or Costovertebral angle tenderness- Right.  Auscultation: Auscultation of the abdomen reveals- Bowel Sounds normal.      Assessment & Plan:

## 2014-10-13 NOTE — Assessment & Plan Note (Addendum)
Your appear to have a urinary tract infection. I am prescribing cipro  antibiotic for the probable infection. Hydrate well. I am sending out a urine culture. During the interim if your signs and symptoms worsen rather than improving please notify us. We will notify your when the culture results are back.  Follow up in 7 days or as needed. 

## 2014-10-13 NOTE — Addendum Note (Signed)
Addended by: Vernie Shanks E on: 10/13/2014 11:11 AM   Modules accepted: Orders

## 2014-10-14 ENCOUNTER — Telehealth: Payer: Self-pay | Admitting: Medical

## 2014-10-14 MED ORDER — FLUCONAZOLE 150 MG PO TABS: 150.0000 mg | ORAL_TABLET | Freq: Once | ORAL | Status: DC

## 2014-10-14 NOTE — Telephone Encounter (Signed)
Will rx diflucan for yeast infection. Pt wants after starting cipro.

## 2014-10-14 NOTE — Telephone Encounter (Signed)
Patient notified

## 2014-10-15 ENCOUNTER — Encounter (HOSPITAL_BASED_OUTPATIENT_CLINIC_OR_DEPARTMENT_OTHER): Payer: Self-pay | Admitting: Emergency Medicine

## 2014-10-15 ENCOUNTER — Emergency Department (HOSPITAL_BASED_OUTPATIENT_CLINIC_OR_DEPARTMENT_OTHER)
Admission: EM | Admit: 2014-10-15 | Discharge: 2014-10-16 | Disposition: A | Discharge status: Home / Self Care | Payer: BLUE CROSS/BLUE SHIELD | Attending: Emergency Medicine | Admitting: Emergency Medicine

## 2014-10-15 DIAGNOSIS — Z79899 Other long term (current) drug therapy: Secondary | ICD-10-CM | POA: Insufficient documentation

## 2014-10-15 DIAGNOSIS — Z8709 Personal history of other diseases of the respiratory system: Secondary | ICD-10-CM | POA: Insufficient documentation

## 2014-10-15 DIAGNOSIS — I10 Essential (primary) hypertension: Secondary | ICD-10-CM | POA: Insufficient documentation

## 2014-10-15 DIAGNOSIS — Z7982 Long term (current) use of aspirin: Secondary | ICD-10-CM | POA: Insufficient documentation

## 2014-10-15 DIAGNOSIS — Z8639 Personal history of other endocrine, nutritional and metabolic disease: Secondary | ICD-10-CM | POA: Insufficient documentation

## 2014-10-15 DIAGNOSIS — M8580 Other specified disorders of bone density and structure, unspecified site: Secondary | ICD-10-CM | POA: Diagnosis not present

## 2014-10-15 DIAGNOSIS — Z792 Long term (current) use of antibiotics: Secondary | ICD-10-CM | POA: Insufficient documentation

## 2014-10-15 DIAGNOSIS — R112 Nausea with vomiting, unspecified: Secondary | ICD-10-CM

## 2014-10-15 DIAGNOSIS — H81399 Other peripheral vertigo, unspecified ear: Secondary | ICD-10-CM | POA: Diagnosis not present

## 2014-10-15 DIAGNOSIS — R011 Cardiac murmur, unspecified: Secondary | ICD-10-CM | POA: Insufficient documentation

## 2014-10-15 LAB — URINALYSIS, ROUTINE W REFLEX MICROSCOPIC
Bilirubin Urine: NEGATIVE
Glucose, UA: NEGATIVE mg/dL
HGB URINE DIPSTICK: NEGATIVE
Ketones, ur: 15 mg/dL — AB
NITRITE: NEGATIVE
PH: 6.5 (ref 5.0–8.0)
Protein, ur: NEGATIVE mg/dL
Specific Gravity, Urine: 1.009 (ref 1.005–1.030)
UROBILINOGEN UA: 0.2 mg/dL (ref 0.0–1.0)

## 2014-10-15 LAB — URINE MICROSCOPIC-ADD ON

## 2014-10-15 LAB — URINE CULTURE
Colony Count: NO GROWTH
ORGANISM ID, BACTERIA: NO GROWTH

## 2014-10-15 NOTE — ED Notes (Signed)
Patient gave urine sample. 

## 2014-10-15 NOTE — ED Notes (Signed)
Pt reports abd pain onset 1 day ago with N/V reports recent UTI and Tx with cipro by PMD Dr Etter Sjogren

## 2014-10-16 MED ORDER — PROMETHAZINE HCL 25 MG PO TABS: 25.0000 mg | ORAL_TABLET | Freq: Four times a day (QID) | ORAL | Status: DC | PRN

## 2014-10-16 MED ORDER — DIAZEPAM 5 MG/ML IJ SOLN: 2.5000 mg | Freq: Once | INTRAMUSCULAR | Status: DC

## 2014-10-16 MED ORDER — PROMETHAZINE HCL 25 MG/ML IJ SOLN
INTRAMUSCULAR | Status: AC
  Filled 2014-10-16: qty 1

## 2014-10-16 MED ORDER — PROMETHAZINE HCL 25 MG/ML IJ SOLN: 12.5000 mg | Freq: Once | INTRAMUSCULAR | Status: DC

## 2014-10-16 MED ORDER — SODIUM CHLORIDE 0.9 % IV SOLN
Freq: Once | INTRAVENOUS | Status: AC
  Administered 2014-10-16: 01:00:00 via INTRAVENOUS

## 2014-10-16 MED ORDER — ONDANSETRON HCL 4 MG/2ML IJ SOLN
4.0000 mg | Freq: Once | INTRAMUSCULAR | Status: AC
  Administered 2014-10-16: 4 mg via INTRAVENOUS
  Filled 2014-10-16: qty 2

## 2014-10-16 MED ORDER — PROMETHAZINE HCL 25 MG/ML IJ SOLN
12.5000 mg | Freq: Four times a day (QID) | INTRAMUSCULAR | Status: DC | PRN
  Administered 2014-10-16: 12.5 mg via INTRAVENOUS

## 2014-10-16 NOTE — ED Notes (Signed)
Pt c/o constant nausea, vomiting x6 - protocol orders placed for IV insertion, Zofran 4mg  IV, Normal Saline 1L

## 2014-10-16 NOTE — ED Provider Notes (Addendum)
CSN: [id number]     Arrival date & time 10/15/14  2026 History  This chart was scribed for Dominique Fines, MD by Erling Conte, ED Scribe. This patient was seen in room MH11/MH11 and the patient's care was started at 12:14 AM.    Chief Complaint  Patient presents with  . Emesis    The history is provided by the patient. No language interpreter was used.    HPI Comments: Dominique Gonzalez is a 64 y.o. female who was treated for urinary tract infection 3 days ago with ciprofloxacin. She presents to the Emergency Department complaining of persistent nausea and vomiting since yesterday morning. She is having abdominal discomfort that "feels like something needs to come up" and "room spinning" dizziness. The dizziness and vomiting are exacerbated by movement. She suspects the Cipro may have been causing her symptoms and her last dose was yesterday morning. She did not take yesterday;s evening dose. She denies any diarrhea   Past Medical History  Diagnosis Date  . HTN (hypertension)   . Aortic insufficiency     mild  . Hyperlipidemia   . Diastolic dysfunction   . Cardiac murmur   . Fatigue   . Leg pain, right   . Hip pain, right   . B12 deficiency   . Sinusitis, acute     NOS  . URI (upper respiratory infection)   . Osteopenia   . Shoulder pain, left    Past Surgical History  Procedure Laterality Date  . Abdominal hysterectomy      age 49  . Bilateral oophorectomy     Family History  Problem Relation Age of Onset  . Hypertension Father   . Hypertension Mother   . Breast cancer Mother   . Diabetes Father    History  Substance Use Topics  . Smoking status: Former Smoker    Quit date: 01/10/1996  . Smokeless tobacco: Not on file     Comment: smoked for 10 years,   . Alcohol Use: No   OB History    No data available     Review of Systems  All other systems reviewed and are negative.   Allergies  Bactrim  Home Medications   Prior to Admission medications    Medication Sig Start Date End Date Taking? Authorizing Provider  aspirin 81 MG tablet Take 81 mg by mouth daily.      Historical Provider, MD  Cholecalciferol (VITAMIN D3) 2000 UNITS capsule Take 2,000 Units by mouth daily.      Historical Provider, MD  ciprofloxacin (CIPRO) 500 MG tablet Take 1 tablet (500 mg total) by mouth 2 (two) times daily. 10/13/14   [place], PA-C  fluconazole (DIFLUCAN) 150 MG tablet Take 1 tablet (150 mg total) by mouth once. 10/14/14   [place], PA-C  lisinopril-hydrochlorothiazide (PRINZIDE,ZESTORETIC) 10-12.5 MG per tablet TAKE 1 TABLET BY MOUTH DAILY. 08/15/14   Rosalita Chessman, DO  Nutritional Supplements (JUICE PLUS FIBRE PO) Take by mouth.      Historical Provider, MD  ondansetron (ZOFRAN-ODT) 4 MG disintegrating tablet Take 1 tablet (4 mg total) by mouth every 8 (eight) hours as needed for nausea or vomiting. 02/04/14   Midge Minium, MD  potassium chloride SA (KLOR-CON M20) 20 MEQ tablet Take 1 tablet (20 mEq total) by mouth 2 (two) times daily. 08/30/11   Rosalita Chessman, DO  risedronate (ACTONEL) 150 MG tablet Take 150 mg by mouth every 30 (thirty) days. with water on empty  stomach, nothing by mouth or lie down for next 30 minutes.     Historical Provider, MD   Triage Vitals: BP 142/72 mmHg  Pulse 81  Temp(Src) 98.6 F (37 C)  Ht 5\' 4"  (1.626 m)  Wt 168 lb (76.204 kg)  BMI 28.82 kg/m2  SpO2 98%   Physical Exam  Nursing note and vitals reviewed.  General: Well-developed, well-nourished female in no acute distress; appearance consistent with age of record HENT: normocephalic; atraumatic Eyes: pupils equal, round and reactive to light; extraocular muscles intact. No nystagmus Neck: supple Heart: regular rate and rhythm; no murmurs, rubs or gallops Lungs: clear to auscultation bilaterally Abdomen: soft; nondistended; nontender; no masses or hepatosplenomegaly; bowel sounds present Extremities: No deformity; full range of motion; pulses  normal Neurologic: Awake, alert and oriented; motor function intact in all extremities and symmetric; no facial droop Skin: Warm and dry Psychiatric: Normal mood and affect   ED Course  Procedures (including critical care time)  DIAGNOSTIC STUDIES: Oxygen Saturation is 98% on RA, normal by my interpretation.    COORDINATION OF CARE: 12:19 AM- Will order UA.  Pt advised of plan for treatment and pt agrees.    MDM   Final diagnoses:  None   Results for orders placed or performed during the hospital encounter of 10/15/14  Urinalysis, Routine w reflex microscopic  Result Value Ref Range   Color, Urine YELLOW YELLOW   APPearance CLEAR CLEAR   Specific Gravity, Urine 1.009 1.005 - 1.030   pH 6.5 5.0 - 8.0   Glucose, UA NEGATIVE NEGATIVE mg/dL   Hgb urine dipstick NEGATIVE NEGATIVE   Bilirubin Urine NEGATIVE NEGATIVE   Ketones, ur 15 (A) NEGATIVE mg/dL   Protein, ur NEGATIVE NEGATIVE mg/dL   Urobilinogen, UA 0.2 0.0 - 1.0 mg/dL   Nitrite NEGATIVE NEGATIVE   Leukocytes, UA TRACE (A) NEGATIVE  Urine microscopic-add on  Result Value Ref Range   Squamous Epithelial / LPF FEW (A) RARE   WBC, UA 3-6 <3 WBC/hpf   RBC / HPF 0-2 <3 RBC/hpf   Bacteria, UA FEW (A) RARE   12:33 AM Patient adamantly refuses IV Valium for treatment of her vertigo symptoms, she has become very tearful at the thought of receiving Valium due to a bad postsurgical reaction in the past. Valium ordered canceled. IV Phenergan ordered.  1:55 AM Nausea significantly improved after IV Phenergan. Vertigo improved as well. Patient able to drink without emesis. She states she is ready to go home. We will have her discontinue the ciprofloxacin. 4 quinolones are known to mimic neurologic dysfunction, especially in the elderly. Her urinary tract infection appears to be resolved at this time. Her urine was sent for culture.  I personally performed the services described in this documentation, which was scribed in my  presence. The recorded information has been reviewed and is accurate.   Dominique Fines, MD 10/16/14 0923  Dominique Fines, MD 10/16/14 3007

## 2014-10-17 ENCOUNTER — Telehealth: Payer: Self-pay | Admitting: Family Medicine

## 2014-10-17 ENCOUNTER — Telehealth: Payer: Self-pay | Admitting: Medical

## 2014-10-17 LAB — URINE CULTURE

## 2014-10-17 NOTE — Telephone Encounter (Signed)
Caller name: Karmella Relation to pt: self Call back number: [phone] Pharmacy:  Reason for call:   Requesting urine culture results.

## 2014-10-17 NOTE — Telephone Encounter (Signed)
Note to pt on urine culture.

## 2014-10-19 NOTE — Telephone Encounter (Signed)
Patient returned my call advised that there was no growth on UC. Patient states she feels better now.

## 2014-10-19 NOTE — Telephone Encounter (Signed)
Left message for patient to return my call.Will disuss results of UC.

## 2015-01-04 ENCOUNTER — Ambulatory Visit (INDEPENDENT_AMBULATORY_CARE_PROVIDER_SITE_OTHER): Payer: BLUE CROSS/BLUE SHIELD | Admitting: Cardiovascular Disease

## 2015-01-04 ENCOUNTER — Encounter: Payer: Self-pay | Admitting: Cardiovascular Disease

## 2015-01-04 VITALS — BP 126/70 | HR 69 | Ht 64.0 in | Wt 165.0 lb

## 2015-01-04 DIAGNOSIS — I351 Nonrheumatic aortic (valve) insufficiency: Secondary | ICD-10-CM

## 2015-01-04 NOTE — Progress Notes (Signed)
Chief Complaint  Patient presents with  . Coronary Artery Disease    History of Present Illness: 64 yo WF with history of HTN and borderline hyperlipidemia here today for follow up. Initially seen in 2011 for evaluation of abnormal echocardiogram. Echo was ordered in workup of a murmur. Her echo in 2011 showed normal LV systolic function, diastolic dysfunction, mild AI, small effusion and possible epicardial mass/aortic root mass. I ordered a cardiac MRI which showed prominent epicardial fat pad with no evidence of aortic or pericardial tumor or mass. Echo July 2014 with normal LV function, mild LVH, mild AI.   She is here today for follow up. She denies chest pain or SOB. She walks for several miles per day. No LE edema.   Primary Care Physician:  Dr. Etter Sjogren   Past Medical History  Diagnosis Date  . HTN (hypertension)   . Aortic insufficiency     mild  . Hyperlipidemia   . Diastolic dysfunction   . Cardiac murmur   . Fatigue   . Leg pain, right   . Hip pain, right   . B12 deficiency   . Sinusitis, acute     NOS  . URI (upper respiratory infection)   . Osteopenia   . Shoulder pain, left     Past Surgical History  Procedure Laterality Date  . Abdominal hysterectomy      age 36  . Bilateral oophorectomy      Current Outpatient Prescriptions  Medication Sig Dispense Refill  . aspirin 81 MG tablet Take 81 mg by mouth daily.      Marland Kitchen lisinopril-hydrochlorothiazide (PRINZIDE,ZESTORETIC) 10-12.5 MG per tablet TAKE 1 TABLET BY MOUTH DAILY. 30 tablet 5   No current facility-administered medications for this visit.    Allergies  Allergen Reactions  . Bactrim [Sulfamethoxazole-Trimethoprim]     History   Social History  . Marital Status: Married    Spouse Name: N/A  . Number of Children: 2  . Years of Education: N/A   Occupational History  . Not on file.   Social History Main Topics  . Smoking status: Former Smoker    Quit date: 01/10/1996  . Smokeless tobacco:  Not on file     Comment: smoked for 10 years,   . Alcohol Use: No  . Drug Use: No  . Sexual Activity: Not on file   Other Topics Concern  . Not on file   Social History Narrative    Family History  Problem Relation Age of Onset  . Hypertension Father   . Diabetes Father   . Hypertension Mother   . Breast cancer Mother     Review of Systems:  As stated in the HPI and otherwise negative.   BP 126/70 mmHg  Pulse 69  Ht 5\' 4"  (1.626 m)  Wt 165 lb (74.844 kg)  BMI 28.31 kg/m2  Physical Examination: General: Well developed, well nourished, NAD HEENT: OP clear, mucus membranes moist SKIN: warm, dry. No rashes. Neuro: No focal deficits Musculoskeletal: Muscle strength 5/5 all ext Psychiatric: Mood and affect normal Neck: No JVD, no carotid bruits, no thyromegaly, no lymphadenopathy. Lungs:Clear bilaterally, no wheezes, rhonci, crackles Cardiovascular: Regular rate and rhythm. No murmurs, gallops or rubs. Abdomen:Soft. Bowel sounds present. Non-tender.  Extremities: No lower extremity edema. Pulses are 2 + in the bilateral DP/PT.  Echo 03/05/13: Left ventricle: The cavity size was normal. Wall thickness was increased in a pattern of mild LVH. Systolic function was normal. The estimated ejection fraction was  in the range of 60% to 65%. Wall motion was normal; there were no regional wall motion abnormalities. There was an increased relative contribution of atrial contraction to ventricular filling. - Aortic valve: Mild regurgitation.  EKG:  EKG is ordered today. The ekg ordered today demonstrates NSR, rate 69 bpm.   Recent Labs: 05/03/2014: ALT 18; BUN 7; Creatinine 0.7; Hemoglobin 13.3; Platelets 252.0; Potassium 4.2; Sodium 140; TSH 1.09   Lipid Panel    Component Value Date/Time   CHOL 187 08/30/2011 1041   TRIG 93.0 08/30/2011 1041   HDL 41.80 08/30/2011 1041   CHOLHDL 4 08/30/2011 1041   VLDL 18.6 08/30/2011 1041   LDLCALC 127* 08/30/2011 1041   LDLDIRECT  149.3 07/28/2007 0000     Wt Readings from Last 3 Encounters:  01/04/15 165 lb (74.844 kg)  10/15/14 168 lb (76.204 kg)  10/13/14 168 lb 12.8 oz (76.567 kg)     Other studies Reviewed: Additional studies/ records that were reviewed today include: . Review of the above records demonstrates:    Assessment and Plan:   1. AORTIC VALVE INSUFFICIENCY:  Mild by echo 2014. Will repeat echo now.    Current medicines are reviewed at length with the patient today.  The patient does not have concerns regarding medicines.  The following changes have been made:  no change  Labs/ tests ordered today include:   Orders Placed This Encounter  Procedures  . EKG 12-Lead  . Echocardiogram    Disposition:   FU with me in 12  months  Signed, Lauree Chandler, MD 01/04/2015 4:50 PM    [facility] [place], [place], [place]  [address] Phone: [phone]; Fax: [phone]

## 2015-01-04 NOTE — Patient Instructions (Signed)
Medication Instructions:  Your physician recommends that you continue on your current medications as directed. Please refer to the Current Medication list given to you today.   Labwork: none  Testing/Procedures: Your physician has requested that you have an echocardiogram. Echocardiography is a painless test that uses sound waves to create images of your heart. It provides your doctor with information about the size and shape of your heart and how well your heart's chambers and valves are working. This procedure takes approximately one hour. There are no restrictions for this procedure.    Follow-Up: Your physician wants you to follow-up in: 12 months.  You will receive a reminder letter in the mail two months in advance. If you don't receive a letter, please call our office to schedule the follow-up appointment.

## 2015-01-09 ENCOUNTER — Ambulatory Visit (HOSPITAL_COMMUNITY): Payer: BLUE CROSS/BLUE SHIELD | Attending: Internal Medicine

## 2015-01-09 ENCOUNTER — Other Ambulatory Visit: Payer: Self-pay

## 2015-01-09 DIAGNOSIS — E785 Hyperlipidemia, unspecified: Secondary | ICD-10-CM | POA: Insufficient documentation

## 2015-01-09 DIAGNOSIS — Z87891 Personal history of nicotine dependence: Secondary | ICD-10-CM | POA: Diagnosis not present

## 2015-01-09 DIAGNOSIS — I10 Essential (primary) hypertension: Secondary | ICD-10-CM | POA: Diagnosis not present

## 2015-01-09 DIAGNOSIS — I351 Nonrheumatic aortic (valve) insufficiency: Secondary | ICD-10-CM | POA: Diagnosis not present

## 2015-02-13 ENCOUNTER — Encounter: Payer: Self-pay | Admitting: Medical

## 2015-02-13 ENCOUNTER — Encounter: Payer: BLUE CROSS/BLUE SHIELD | Admitting: Medical

## 2015-02-13 NOTE — Progress Notes (Signed)
Pre visit review using our clinic review tool, if applicable. No additional management support is needed unless otherwise documented below in the visit note. 

## 2015-02-14 ENCOUNTER — Telehealth: Payer: Self-pay | Admitting: Family Medicine

## 2015-02-14 ENCOUNTER — Ambulatory Visit: Payer: BLUE CROSS/BLUE SHIELD | Admitting: Family Medicine

## 2015-02-14 DIAGNOSIS — Z0289 Encounter for other administrative examinations: Secondary | ICD-10-CM

## 2015-02-14 NOTE — Telephone Encounter (Signed)
Pt left without being seen for appt 02/13/15 11:15 am, acute appt with Percell Miller, rescheduled for 02/14/15 with Dr. Etter Sjogren, Pt was no show for 02/14/15 1:00pm appt, called to reschedule 02/14/15 10:19 am for 02/16/15 2:15pm with Dr. Birdie Riddle.  Charge for 02/13/15 with Percell Miller? Charge for 02/14/15 with Dr. Etter Sjogren?

## 2015-02-14 NOTE — Telephone Encounter (Signed)
Yes for both. 

## 2015-02-14 NOTE — Telephone Encounter (Signed)
Due to work conflict pt canceled appointment for today

## 2015-02-14 NOTE — Telephone Encounter (Signed)
No charge. 

## 2015-02-16 ENCOUNTER — Ambulatory Visit: Payer: BLUE CROSS/BLUE SHIELD | Admitting: Family Medicine

## 2015-03-02 NOTE — Progress Notes (Signed)
This encounter was created in error - please disregard.

## 2015-03-29 ENCOUNTER — Other Ambulatory Visit: Payer: Self-pay | Admitting: Family Medicine

## 2015-03-29 NOTE — Telephone Encounter (Signed)
Lisinopril-HCTZ refill sent to pharmacy.  Pt last saw PCP 04/2014 and needs follow up.  Please call pt to scheduled appt.  Thanks!

## 2015-04-25 ENCOUNTER — Encounter: Payer: Self-pay | Admitting: Family Medicine

## 2015-04-25 ENCOUNTER — Ambulatory Visit (INDEPENDENT_AMBULATORY_CARE_PROVIDER_SITE_OTHER): Payer: BLUE CROSS/BLUE SHIELD | Admitting: Family Medicine

## 2015-04-25 VITALS — BP 124/82 | HR 74 | Temp 98.4°F | Wt 166.2 lb

## 2015-04-25 DIAGNOSIS — J069 Acute upper respiratory infection, unspecified: Secondary | ICD-10-CM | POA: Diagnosis not present

## 2015-04-25 DIAGNOSIS — R5383 Other fatigue: Secondary | ICD-10-CM

## 2015-04-25 DIAGNOSIS — R300 Dysuria: Secondary | ICD-10-CM | POA: Diagnosis not present

## 2015-04-25 DIAGNOSIS — N760 Acute vaginitis: Secondary | ICD-10-CM | POA: Diagnosis not present

## 2015-04-25 LAB — POCT URINALYSIS DIPSTICK
Bilirubin, UA: NEGATIVE
Blood, UA: NEGATIVE
Glucose, UA: NEGATIVE
Ketones, UA: NEGATIVE
NITRITE UA: NEGATIVE
Spec Grav, UA: 1.025
UROBILINOGEN UA: 1
pH, UA: 6

## 2015-04-25 MED ORDER — LORATADINE 10 MG PO TABS: 10.0000 mg | ORAL_TABLET | Freq: Every day | ORAL | Status: DC

## 2015-04-25 MED ORDER — AMOXICILLIN-POT CLAVULANATE 875-125 MG PO TABS: 1.0000 | ORAL_TABLET | Freq: Two times a day (BID) | ORAL | Status: DC

## 2015-04-25 MED ORDER — FLUCONAZOLE 150 MG PO TABS: 150.0000 mg | ORAL_TABLET | Freq: Once | ORAL | Status: DC

## 2015-04-25 MED ORDER — FLUTICASONE PROPIONATE 50 MCG/ACT NA SUSP: 2.0000 | Freq: Every day | NASAL | Status: DC

## 2015-04-25 NOTE — Patient Instructions (Addendum)
Use mucinex DM, or Delsym for cough        Upper Respiratory Infection, Adult An upper respiratory infection (URI) is also sometimes known as the common cold. The upper respiratory tract includes the nose, sinuses, throat, trachea, and bronchi. Bronchi are the airways leading to the lungs. Most people improve within 1 week, but symptoms can last up to 2 weeks. A residual cough may last even longer.  CAUSES Many different viruses can infect the tissues lining the upper respiratory tract. The tissues become irritated and inflamed and often become very moist. Mucus production is also common. A cold is contagious. You can easily spread the virus to others by oral contact. This includes kissing, sharing a glass, coughing, or sneezing. Touching your mouth or nose and then touching a surface, which is then touched by another person, can also spread the virus. SYMPTOMS  Symptoms typically develop 1 to 3 days after you come in contact with a cold virus. Symptoms vary from person to person. They may include:  Runny nose.  Sneezing.  Nasal congestion.  Sinus irritation.  Sore throat.  Loss of voice (laryngitis).  Cough.  Fatigue.  Muscle aches.  Loss of appetite.  Headache.  Low-grade fever. DIAGNOSIS  You might diagnose your own cold based on familiar symptoms, since most people get a cold 2 to 3 times a year. Your caregiver can confirm this based on your exam. Most importantly, your caregiver can check that your symptoms are not due to another disease such as strep throat, sinusitis, pneumonia, asthma, or epiglottitis. Blood tests, throat tests, and X-rays are not necessary to diagnose a common cold, but they may sometimes be helpful in excluding other more serious diseases. Your caregiver will decide if any further tests are required. RISKS AND COMPLICATIONS  You may be at risk for a more severe case of the common cold if you smoke cigarettes, have chronic heart disease (such as  heart failure) or lung disease (such as asthma), or if you have a weakened immune system. The very young and very old are also at risk for more serious infections. Bacterial sinusitis, middle ear infections, and bacterial pneumonia can complicate the common cold. The common cold can worsen asthma and chronic obstructive pulmonary disease (COPD). Sometimes, these complications can require emergency medical care and may be life-threatening. PREVENTION  The best way to protect against getting a cold is to practice good hygiene. Avoid oral or hand contact with people with cold symptoms. Wash your hands often if contact occurs. There is no clear evidence that vitamin C, vitamin E, echinacea, or exercise reduces the chance of developing a cold. However, it is always recommended to get plenty of rest and practice good nutrition. TREATMENT  Treatment is directed at relieving symptoms. There is no cure. Antibiotics are not effective, because the infection is caused by a virus, not by bacteria. Treatment may include:  Increased fluid intake. Sports drinks offer valuable electrolytes, sugars, and fluids.  Breathing heated mist or steam (vaporizer or shower).  Eating chicken soup or other clear broths, and maintaining good nutrition.  Getting plenty of rest.  Using gargles or lozenges for comfort.  Controlling fevers with ibuprofen or acetaminophen as directed by your caregiver.  Increasing usage of your inhaler if you have asthma. Zinc gel and zinc lozenges, taken in the first 24 hours of the common cold, can shorten the duration and lessen the severity of symptoms. Pain medicines may help with fever, muscle aches, and throat pain. A  variety of non-prescription medicines are available to treat congestion and runny nose. Your caregiver can make recommendations and may suggest nasal or lung inhalers for other symptoms.  HOME CARE INSTRUCTIONS   Only take over-the-counter or prescription medicines for pain,  discomfort, or fever as directed by your caregiver.  Use a warm mist humidifier or inhale steam from a shower to increase air moisture. This may keep secretions moist and make it easier to breathe.  Drink enough water and fluids to keep your urine clear or pale yellow.  Rest as needed.  Return to work when your temperature has returned to normal or as your caregiver advises. You may need to stay home longer to avoid infecting others. You can also use a face mask and careful hand washing to prevent spread of the virus. SEEK MEDICAL CARE IF:   After the first few days, you feel you are getting worse rather than better.  You need your caregiver's advice about medicines to control symptoms.  You develop chills, worsening shortness of breath, or brown or red sputum. These may be signs of pneumonia.  You develop yellow or brown nasal discharge or pain in the face, especially when you bend forward. These may be signs of sinusitis.  You develop a fever, swollen neck glands, pain with swallowing, or white areas in the back of your throat. These may be signs of strep throat. SEEK IMMEDIATE MEDICAL CARE IF:   You have a fever.  You develop severe or persistent headache, ear pain, sinus pain, or chest pain.  You develop wheezing, a prolonged cough, cough up blood, or have a change in your usual mucus (if you have chronic lung disease).  You develop sore muscles or a stiff neck. Document Released: 02/05/2001 Document Revised: 11/04/2011 Document Reviewed: 11/17/2013 [facility] Patient Information 2015 [place], [place]. This information is not intended to replace advice given to you by your health care provider. Make sure you discuss any questions you have with your health care provider.

## 2015-04-25 NOTE — Progress Notes (Signed)
Patient ID: Dominique Gonzalez, female    DOB: [birth year]  Age: 64 y.o. MRN: [id number]    Subjective:  Subjective HPI Dominique Gonzalez presents for fatigue, she is requesting her b12 be checked and she is complaining about sinus congestion, cough.  No fever/ chills.   Pt took sudafed and advil x 3 days with no relief and symptoms are worsening.    Review of Systems  Constitutional: Positive for chills. Negative for fever.  HENT: Positive for congestion, postnasal drip, rhinorrhea and sinus pressure.   Respiratory: Positive for cough. Negative for choking, chest tightness, shortness of breath and wheezing.   Cardiovascular: Negative for chest pain, palpitations and leg swelling.  Genitourinary: Positive for dysuria and frequency.  Allergic/Immunologic: Negative for environmental allergies.  Psychiatric/Behavioral: Negative for dysphoric mood and decreased concentration. The patient is not nervous/anxious.     History Past Medical History  Diagnosis Date  . HTN (hypertension)   . Aortic insufficiency     mild  . Hyperlipidemia   . Diastolic dysfunction   . Cardiac murmur   . Fatigue   . Leg pain, right   . Hip pain, right   . B12 deficiency   . Sinusitis, acute     NOS  . URI (upper respiratory infection)   . Osteopenia   . Shoulder pain, left     She has past surgical history that includes Abdominal hysterectomy and Bilateral oophorectomy.   Her family history includes Breast cancer in her mother; Diabetes in her father; Hypertension in her father and mother.She reports that she quit smoking about 19 years ago. She does not have any smokeless tobacco history on file. She reports that she does not drink alcohol or use illicit drugs.  Current Outpatient Prescriptions on File Prior to Visit  Medication Sig Dispense Refill  . aspirin 81 MG tablet Take 81 mg by mouth daily.      Marland Kitchen lisinopril-hydrochlorothiazide (PRINZIDE,ZESTORETIC) 10-12.5 MG per tablet TAKE 1 TABLET BY MOUTH  DAILY. 30 tablet 1   No current facility-administered medications on file prior to visit.     Objective:  Objective Physical Exam  Constitutional: She is oriented to person, place, and time. She appears well-developed and well-nourished.  HENT:  Right Ear: Hearing, tympanic membrane, external ear and ear canal normal.  Left Ear: Hearing, tympanic membrane, external ear and ear canal normal.  Nose: Rhinorrhea present. No mucosal edema. Right sinus exhibits maxillary sinus tenderness and frontal sinus tenderness. Left sinus exhibits maxillary sinus tenderness and frontal sinus tenderness.  Mouth/Throat: Posterior oropharyngeal erythema present. No posterior oropharyngeal edema.  + PND + errythema  Eyes: Conjunctivae are normal. Right eye exhibits no discharge. Left eye exhibits no discharge.  Cardiovascular: Normal rate, regular rhythm and normal heart sounds.   No murmur heard. Pulmonary/Chest: Effort normal and breath sounds normal. No respiratory distress. She has no wheezes. She has no rales. She exhibits no tenderness.  Musculoskeletal: She exhibits no edema.  Lymphadenopathy:    She has cervical adenopathy.  Neurological: She is alert and oriented to person, place, and time.  Psychiatric: She has a normal mood and affect. Her behavior is normal.   BP 124/82 mmHg  Pulse 74  Temp(Src) 98.4 F (36.9 C) (Oral)  Wt 166 lb 3.2 oz (75.388 kg)  SpO2 98% Wt Readings from Last 3 Encounters:  04/25/15 166 lb 3.2 oz (75.388 kg)  02/13/15 166 lb 3.2 oz (75.388 kg)  01/04/15 165 lb (74.844 kg)     Lab Results  Component Value Date   WBC 6.2 05/03/2014   HGB 13.3 05/03/2014   HCT 39.3 05/03/2014   PLT 252.0 05/03/2014   GLUCOSE 97 05/03/2014   CHOL 187 08/30/2011   TRIG 93.0 08/30/2011   HDL 41.80 08/30/2011   LDLDIRECT 149.3 07/28/2007   LDLCALC 127* 08/30/2011   ALT 18 05/03/2014   AST 25 05/03/2014   NA 140 05/03/2014   K 4.2 05/03/2014   CL 107 05/03/2014   CREATININE  0.7 05/03/2014   BUN 7 05/03/2014   CO2 26 05/03/2014   TSH 1.09 05/03/2014    No results found.   Assessment & Plan:  Plan I am having Dominique Gonzalez start on amoxicillin-clavulanate, fluticasone, loratadine, and fluconazole. I am also having her maintain her aspirin and lisinopril-hydrochlorothiazide.  Meds ordered this encounter  Medications  . amoxicillin-clavulanate (AUGMENTIN) 875-125 MG per tablet    Sig: Take 1 tablet by mouth 2 (two) times daily.    Dispense:  20 tablet    Refill:  0  . fluticasone (FLONASE) 50 MCG/ACT nasal spray    Sig: Place 2 sprays into both nostrils daily.    Dispense:  16 g    Refill:  6  . loratadine (CLARITIN) 10 MG tablet    Sig: Take 1 tablet (10 mg total) by mouth daily.    Dispense:  30 tablet    Refill:  11  . fluconazole (DIFLUCAN) 150 MG tablet    Sig: Take 1 tablet (150 mg total) by mouth once. 1 po qd x1 may repeat 3 days prn    Dispense:  2 tablet    Refill:  0    Problem List Items Addressed This Visit    Dysuria - Primary   Relevant Medications   amoxicillin-clavulanate (AUGMENTIN) 875-125 MG per tablet   Other Relevant Orders   POCT Urinalysis Dipstick (Completed)   Urine Culture (Completed)   Acute upper respiratory infection   Relevant Medications   fluticasone (FLONASE) 50 MCG/ACT nasal spray   loratadine (CLARITIN) 10 MG tablet   fluconazole (DIFLUCAN) 150 MG tablet    Other Visit Diagnoses    Other fatigue        Relevant Orders    Basic metabolic panel    CBC with Differential/Platelet    Vitamin B12    TSH    Vaginitis and vulvovaginitis        Relevant Medications    fluconazole (DIFLUCAN) 150 MG tablet       Follow-up: Return if symptoms worsen or fail to improve.  Garnet Koyanagi, DO

## 2015-04-25 NOTE — Progress Notes (Signed)
Pre visit review using our clinic review tool, if applicable. No additional management support is needed unless otherwise documented below in the visit note. 

## 2015-04-27 ENCOUNTER — Other Ambulatory Visit: Payer: BLUE CROSS/BLUE SHIELD

## 2015-04-27 LAB — URINE CULTURE
COLONY COUNT: NO GROWTH
ORGANISM ID, BACTERIA: NO GROWTH

## 2015-05-02 ENCOUNTER — Other Ambulatory Visit (INDEPENDENT_AMBULATORY_CARE_PROVIDER_SITE_OTHER): Payer: BLUE CROSS/BLUE SHIELD

## 2015-05-02 DIAGNOSIS — R5383 Other fatigue: Secondary | ICD-10-CM

## 2015-05-03 LAB — CBC WITH DIFFERENTIAL/PLATELET
BASOS ABS: 0.1 10*3/uL (ref 0.0–0.1)
Basophils Relative: 1.2 % (ref 0.0–3.0)
Eosinophils Absolute: 0.2 10*3/uL (ref 0.0–0.7)
Eosinophils Relative: 2.4 % (ref 0.0–5.0)
HEMATOCRIT: 40.3 % (ref 36.0–46.0)
Hemoglobin: 13.6 g/dL (ref 12.0–15.0)
Lymphocytes Relative: 37.7 % (ref 12.0–46.0)
Lymphs Abs: 2.7 10*3/uL (ref 0.7–4.0)
MCHC: 33.6 g/dL (ref 30.0–36.0)
MCV: 90.4 fl (ref 78.0–100.0)
MONOS PCT: 7.2 % (ref 3.0–12.0)
Monocytes Absolute: 0.5 10*3/uL (ref 0.1–1.0)
NEUTROS ABS: 3.7 10*3/uL (ref 1.4–7.7)
Neutrophils Relative %: 51.5 % (ref 43.0–77.0)
Platelets: 276 10*3/uL (ref 150.0–400.0)
RBC: 4.45 Mil/uL (ref 3.87–5.11)
RDW: 13.5 % (ref 11.5–15.5)
WBC: 7.2 10*3/uL (ref 4.0–10.5)

## 2015-05-03 LAB — TSH: TSH: 1.37 u[IU]/mL (ref 0.35–4.50)

## 2015-05-03 LAB — BASIC METABOLIC PANEL
BUN: 9 mg/dL (ref 6–23)
CALCIUM: 8.9 mg/dL (ref 8.4–10.5)
CO2: 25 meq/L (ref 19–32)
Chloride: 105 mEq/L (ref 96–112)
Creatinine, Ser: 0.74 mg/dL (ref 0.40–1.20)
GFR: 84.3 mL/min (ref >60.00)
Glucose, Bld: 82 mg/dL (ref 70–99)
Potassium: 4.3 mEq/L (ref 3.5–5.1)
SODIUM: 141 meq/L (ref 135–145)

## 2015-05-03 LAB — VITAMIN B12: Vitamin B-12: 130 pg/mL — ABNORMAL LOW (ref 211–911)

## 2015-05-04 ENCOUNTER — Telehealth: Payer: Self-pay | Admitting: Family Medicine

## 2015-05-04 NOTE — Telephone Encounter (Signed)
Relation to VC:BSWH Call back number: [phone]   Reason for call:   Patient inquiring about lab results

## 2015-05-04 NOTE — Telephone Encounter (Signed)
Patient has been made aware that the results are not back, and she asked about the urine culture. I made her aware that was WNL and she verbalized understanding.       KP

## 2015-05-08 ENCOUNTER — Telehealth: Payer: Self-pay | Admitting: Family Medicine

## 2015-05-08 NOTE — Telephone Encounter (Signed)
°  Relation to JG:GEZM Call back number:[phone] Pharmacy:  Reason for call: pt states that she is unable to get in her my chart and would like to know her results from her labs that were done on last week.

## 2015-05-09 NOTE — Telephone Encounter (Signed)
Please call pt with results her mychart is not letting her in. Pt did not want to reset password with me. Call [phone].

## 2015-05-10 ENCOUNTER — Ambulatory Visit (INDEPENDENT_AMBULATORY_CARE_PROVIDER_SITE_OTHER): Payer: BLUE CROSS/BLUE SHIELD | Admitting: Family Medicine

## 2015-05-10 DIAGNOSIS — E538 Deficiency of other specified B group vitamins: Secondary | ICD-10-CM

## 2015-05-10 MED ORDER — CYANOCOBALAMIN 1000 MCG/ML IJ SOLN
1000.0000 ug | Freq: Once | INTRAMUSCULAR | Status: AC
  Administered 2015-05-24: 1000 ug via INTRAMUSCULAR

## 2015-05-10 NOTE — Telephone Encounter (Signed)
Lab results were reviewed with patient by Conception Oms, RN.

## 2015-05-11 ENCOUNTER — Encounter: Payer: Self-pay | Admitting: Family Medicine

## 2015-05-11 NOTE — Progress Notes (Signed)
Patient ID: Dominique Gonzalez, female    DOB: [birth year]  Age: 64 y.o. MRN: [id number]    Subjective:  Subjective HPI Laketra Bohnsack presents for b12 injection   Review of Systems  Constitutional: Negative for diaphoresis, appetite change, fatigue and unexpected weight change.  Eyes: Negative for pain, redness and visual disturbance.  Respiratory: Negative for cough, chest tightness, shortness of breath and wheezing.   Cardiovascular: Negative for chest pain, palpitations and leg swelling.  Endocrine: Negative for cold intolerance, heat intolerance, polydipsia, polyphagia and polyuria.  Genitourinary: Negative for dysuria, frequency and difficulty urinating.  Neurological: Negative for dizziness, light-headedness, numbness and headaches.    History Past Medical History  Diagnosis Date  . HTN (hypertension)   . Aortic insufficiency     mild  . Hyperlipidemia   . Diastolic dysfunction   . Cardiac murmur   . Fatigue   . Leg pain, right   . Hip pain, right   . B12 deficiency   . Sinusitis, acute     NOS  . URI (upper respiratory infection)   . Osteopenia   . Shoulder pain, left     She has past surgical history that includes Abdominal hysterectomy and Bilateral oophorectomy.   Her family history includes Breast cancer in her mother; Diabetes in her father; Hypertension in her father and mother.She reports that she quit smoking about 19 years ago. She does not have any smokeless tobacco history on file. She reports that she does not drink alcohol or use illicit drugs.  Current Outpatient Prescriptions on File Prior to Visit  Medication Sig Dispense Refill  . amoxicillin-clavulanate (AUGMENTIN) 875-125 MG per tablet Take 1 tablet by mouth 2 (two) times daily. 20 tablet 0  . aspirin 81 MG tablet Take 81 mg by mouth daily.      . fluconazole (DIFLUCAN) 150 MG tablet Take 1 tablet (150 mg total) by mouth once. 1 po qd x1 may repeat 3 days prn 2 tablet 0  . fluticasone (FLONASE)  50 MCG/ACT nasal spray Place 2 sprays into both nostrils daily. 16 g 6  . lisinopril-hydrochlorothiazide (PRINZIDE,ZESTORETIC) 10-12.5 MG per tablet TAKE 1 TABLET BY MOUTH DAILY. 30 tablet 1  . loratadine (CLARITIN) 10 MG tablet Take 1 tablet (10 mg total) by mouth daily. 30 tablet 11   No current facility-administered medications on file prior to visit.     Objective:  Objective Physical Exam  Constitutional: She is oriented to person, place, and time. She appears well-developed and well-nourished.  HENT:  Head: Normocephalic and atraumatic.  Eyes: Conjunctivae and EOM are normal.  Neck: Normal range of motion. Neck supple. No JVD present. Carotid bruit is not present. No thyromegaly present.  Cardiovascular: Normal rate, regular rhythm and normal heart sounds.   No murmur heard. Pulmonary/Chest: Effort normal and breath sounds normal. No respiratory distress. She has no wheezes. She has no rales. She exhibits no tenderness.  Musculoskeletal: She exhibits no edema.  Neurological: She is alert and oriented to person, place, and time.  Psychiatric: She has a normal mood and affect. Her behavior is normal. Judgment and thought content normal.   There were no vitals taken for this visit. Wt Readings from Last 3 Encounters:  04/25/15 166 lb 3.2 oz (75.388 kg)  02/13/15 166 lb 3.2 oz (75.388 kg)  01/04/15 165 lb (74.844 kg)     Lab Results  Component Value Date   WBC 7.2 05/02/2015   HGB 13.6 05/02/2015   HCT 40.3 05/02/2015   PLT  276.0 05/02/2015   GLUCOSE 82 05/02/2015   CHOL 187 08/30/2011   TRIG 93.0 08/30/2011   HDL 41.80 08/30/2011   LDLDIRECT 149.3 07/28/2007   LDLCALC 127* 08/30/2011   ALT 18 05/03/2014   AST 25 05/03/2014   NA 141 05/02/2015   K 4.3 05/02/2015   CL 105 05/02/2015   CREATININE 0.74 05/02/2015   BUN 9 05/02/2015   CO2 25 05/02/2015   TSH 1.37 05/02/2015    No results found.   Assessment & Plan:  Plan I am having Ms. Siddoway maintain her  aspirin, lisinopril-hydrochlorothiazide, amoxicillin-clavulanate, fluticasone, loratadine, and fluconazole. We will continue to administer cyanocobalamin.  Meds ordered this encounter  Medications  . cyanocobalamin ((VITAMIN B-12)) injection 1,000 mcg    Sig:     Problem List Items Addressed This Visit    None    Visit Diagnoses    Vitamin B 12 deficiency    -  Primary    Relevant Medications    cyanocobalamin ((VITAMIN B-12)) injection 1,000 mcg       Follow-up: No Follow-up on file.  Garnet Koyanagi, DO

## 2015-05-17 ENCOUNTER — Ambulatory Visit (INDEPENDENT_AMBULATORY_CARE_PROVIDER_SITE_OTHER): Payer: BLUE CROSS/BLUE SHIELD

## 2015-05-17 DIAGNOSIS — E538 Deficiency of other specified B group vitamins: Secondary | ICD-10-CM

## 2015-05-17 MED ORDER — CYANOCOBALAMIN 1000 MCG/ML IJ SOLN
1000.0000 ug | Freq: Once | INTRAMUSCULAR | Status: AC
  Administered 2015-05-17: 1000 ug via INTRAMUSCULAR

## 2015-05-24 ENCOUNTER — Ambulatory Visit (INDEPENDENT_AMBULATORY_CARE_PROVIDER_SITE_OTHER): Payer: BLUE CROSS/BLUE SHIELD | Admitting: Internal Medicine

## 2015-05-24 ENCOUNTER — Encounter: Payer: Self-pay | Admitting: Internal Medicine

## 2015-05-24 DIAGNOSIS — E538 Deficiency of other specified B group vitamins: Secondary | ICD-10-CM | POA: Diagnosis not present

## 2015-05-24 MED ORDER — CYANOCOBALAMIN 1000 MCG/ML IJ SOLN
1000.0000 ug | Freq: Once | INTRAMUSCULAR | Status: AC
  Administered 2015-05-24: 1000 ug via INTRAMUSCULAR

## 2015-05-24 NOTE — Progress Notes (Signed)
Patient ID: Dominique Gonzalez, female   DOB: [birth year], 64 y.o.   MRN: [id number]

## 2015-05-24 NOTE — Progress Notes (Signed)
Patient in for B12. Injection. Patient tolerated well.

## 2015-05-31 ENCOUNTER — Ambulatory Visit: Payer: BLUE CROSS/BLUE SHIELD

## 2015-06-01 ENCOUNTER — Ambulatory Visit (INDEPENDENT_AMBULATORY_CARE_PROVIDER_SITE_OTHER): Payer: BLUE CROSS/BLUE SHIELD

## 2015-06-01 DIAGNOSIS — E538 Deficiency of other specified B group vitamins: Secondary | ICD-10-CM

## 2015-06-01 MED ORDER — CYANOCOBALAMIN 1000 MCG/ML IJ SOLN: 1000.0000 ug | Freq: Once | INTRAMUSCULAR | Status: DC

## 2015-06-14 ENCOUNTER — Other Ambulatory Visit: Payer: Self-pay | Admitting: Family Medicine

## 2015-06-29 ENCOUNTER — Ambulatory Visit (INDEPENDENT_AMBULATORY_CARE_PROVIDER_SITE_OTHER): Payer: BLUE CROSS/BLUE SHIELD | Admitting: Behavioral Health

## 2015-06-29 DIAGNOSIS — E538 Deficiency of other specified B group vitamins: Secondary | ICD-10-CM

## 2015-06-29 MED ORDER — CYANOCOBALAMIN 1000 MCG/ML IJ SOLN
1000.0000 ug | Freq: Once | INTRAMUSCULAR | Status: AC
  Administered 2015-06-29: 1000 ug via INTRAMUSCULAR

## 2015-06-29 NOTE — Progress Notes (Signed)
Pre visit review using our clinic review tool, if applicable. No additional management support is needed unless otherwise documented below in the visit note.  Patient tolerated injection well.  Next injection scheduled for 08/01/15 at 4:00 PM.

## 2015-07-06 ENCOUNTER — Encounter: Payer: Self-pay | Admitting: Family Medicine

## 2015-07-06 ENCOUNTER — Ambulatory Visit: Payer: BLUE CROSS/BLUE SHIELD | Admitting: Medical

## 2015-07-06 ENCOUNTER — Ambulatory Visit (INDEPENDENT_AMBULATORY_CARE_PROVIDER_SITE_OTHER): Payer: BLUE CROSS/BLUE SHIELD | Admitting: Family Medicine

## 2015-07-06 VITALS — BP 130/76 | HR 96 | Temp 98.1°F | Wt 166.6 lb

## 2015-07-06 DIAGNOSIS — M79661 Pain in right lower leg: Secondary | ICD-10-CM

## 2015-07-06 MED ORDER — TRAMADOL HCL 50 MG PO TABS: 50.0000 mg | ORAL_TABLET | Freq: Three times a day (TID) | ORAL | Status: DC | PRN

## 2015-07-06 NOTE — Progress Notes (Signed)
Pre visit review using our clinic review tool, if applicable. No additional management support is needed unless otherwise documented below in the visit note. 

## 2015-07-06 NOTE — Progress Notes (Signed)
Patient ID: Dominique Gonzalez, female    DOB: 1950-09-23  Age: 64 y.o. MRN: [id number]    Subjective:  Subjective HPI Dominique Gonzalez presents for swelling and tenderness behind R knee.  No sob, no cp.    Review of Systems  Constitutional: Negative for diaphoresis, appetite change, fatigue and unexpected weight change.  Eyes: Negative for pain, redness and visual disturbance.  Respiratory: Negative for cough, chest tightness, shortness of breath and wheezing.   Cardiovascular: Negative for chest pain, palpitations and leg swelling.  Endocrine: Negative for cold intolerance, heat intolerance, polydipsia, polyphagia and polyuria.  Genitourinary: Negative for dysuria, frequency and difficulty urinating.  Musculoskeletal: Positive for joint swelling. Negative for gait problem.  Neurological: Negative for dizziness, light-headedness, numbness and headaches.    History Past Medical History  Diagnosis Date  . HTN (hypertension)   . Aortic insufficiency     mild  . Hyperlipidemia   . Diastolic dysfunction   . Cardiac murmur   . Fatigue   . Leg pain, right   . Hip pain, right   . B12 deficiency   . Sinusitis, acute     NOS  . URI (upper respiratory infection)   . Osteopenia   . Shoulder pain, left     She has past surgical history that includes Abdominal hysterectomy and Bilateral oophorectomy.   Her family history includes Breast cancer in her mother; Diabetes in her father; Hypertension in her father and mother.She reports that she quit smoking about 19 years ago. She does not have any smokeless tobacco history on file. She reports that she does not drink alcohol or use illicit drugs.  Current Outpatient Prescriptions on File Prior to Visit  Medication Sig Dispense Refill  . aspirin 81 MG tablet Take 81 mg by mouth daily.      . fluticasone (FLONASE) 50 MCG/ACT nasal spray Place 2 sprays into both nostrils daily. 16 g 6  . lisinopril-hydrochlorothiazide (PRINZIDE,ZESTORETIC)  10-12.5 MG tablet TAKE 1 TABLET BY MOUTH DAILY. 30 tablet 5  . loratadine (CLARITIN) 10 MG tablet Take 1 tablet (10 mg total) by mouth daily. 30 tablet 11   Current Facility-Administered Medications on File Prior to Visit  Medication Dose Route Frequency Provider Last Rate Last Dose  . cyanocobalamin ((VITAMIN B-12)) injection 1,000 mcg  1,000 mcg Intramuscular Once Rosalita Chessman, DO         Objective:  Objective Physical Exam  Constitutional: She is oriented to person, place, and time. She appears well-developed and well-nourished.  HENT:  Head: Normocephalic and atraumatic.  Eyes: Conjunctivae and EOM are normal.  Neck: Normal range of motion. Neck supple. No JVD present. Carotid bruit is not present. No thyromegaly present.  Cardiovascular: Normal rate, regular rhythm and normal heart sounds.   No murmur heard. Pulmonary/Chest: Effort normal and breath sounds normal. No respiratory distress. She has no wheezes. She has no rales. She exhibits no tenderness.  Musculoskeletal: She exhibits edema and tenderness.       Right lower leg: She exhibits tenderness, swelling and edema.       Legs: Neurological: She is alert and oriented to person, place, and time.  Psychiatric: She has a normal mood and affect.   BP 130/76 mmHg  Pulse 96  Temp(Src) 98.1 F (36.7 C) (Oral)  Wt 166 lb 9.6 oz (75.569 kg)  SpO2 98% Wt Readings from Last 3 Encounters:  07/06/15 166 lb 9.6 oz (75.569 kg)  04/25/15 166 lb 3.2 oz (75.388 kg)  02/13/15 166 lb  3.2 oz (75.388 kg)     Lab Results  Component Value Date   WBC 7.2 05/02/2015   HGB 13.6 05/02/2015   HCT 40.3 05/02/2015   PLT 276.0 05/02/2015   GLUCOSE 82 05/02/2015   CHOL 187 08/30/2011   TRIG 93.0 08/30/2011   HDL 41.80 08/30/2011   LDLDIRECT 149.3 07/28/2007   LDLCALC 127* 08/30/2011   ALT 18 05/03/2014   AST 25 05/03/2014   NA 141 05/02/2015   K 4.3 05/02/2015   CL 105 05/02/2015   CREATININE 0.74 05/02/2015   BUN 9 05/02/2015    CO2 25 05/02/2015   TSH 1.37 05/02/2015    No results found.   Assessment & Plan:  Plan I have discontinued [name]'s amoxicillin-clavulanate and fluconazole. I am also having her start on traMADol. Additionally, I am having her maintain her aspirin, fluticasone, loratadine, and lisinopril-hydrochlorothiazide. We will continue to administer cyanocobalamin.  Meds ordered this encounter  Medications  . traMADol (ULTRAM) 50 MG tablet    Sig: Take 1 tablet (50 mg total) by mouth every 8 (eight) hours as needed.    Dispense:  30 tablet    Refill:  0    Problem List Items Addressed This Visit    None    Visit Diagnoses    Calf pain, right    -  Primary    Relevant Medications    traMADol (ULTRAM) 50 MG tablet    Other Relevant Orders    [place] Extrem Low Right Comp       Follow-up: No Follow-up on file.  Garnet Koyanagi, DO

## 2015-07-19 ENCOUNTER — Other Ambulatory Visit: Payer: Self-pay

## 2015-07-19 MED ORDER — LISINOPRIL-HYDROCHLOROTHIAZIDE 10-12.5 MG PO TABS: 1.0000 | ORAL_TABLET | Freq: Every day | ORAL | Status: DC

## 2015-08-01 ENCOUNTER — Ambulatory Visit: Payer: BLUE CROSS/BLUE SHIELD

## 2015-08-02 ENCOUNTER — Ambulatory Visit (INDEPENDENT_AMBULATORY_CARE_PROVIDER_SITE_OTHER): Payer: BLUE CROSS/BLUE SHIELD | Admitting: Behavioral Health

## 2015-08-02 DIAGNOSIS — E538 Deficiency of other specified B group vitamins: Secondary | ICD-10-CM | POA: Diagnosis not present

## 2015-08-02 MED ORDER — CYANOCOBALAMIN 1000 MCG/ML IJ SOLN
1000.0000 ug | Freq: Once | INTRAMUSCULAR | Status: AC
Start: 2015-08-02 — End: 2015-08-02
  Administered 2015-08-02: 1000 ug via INTRAMUSCULAR

## 2015-08-02 NOTE — Progress Notes (Signed)
Pre visit review using our clinic review tool, if applicable. No additional management support is needed unless otherwise documented below in the visit note.  Patient tolerated injection well. Next appointment scheduled for 09/06/15 at 2:15 PM.

## 2015-09-06 ENCOUNTER — Ambulatory Visit: Payer: BLUE CROSS/BLUE SHIELD

## 2015-09-08 ENCOUNTER — Ambulatory Visit (INDEPENDENT_AMBULATORY_CARE_PROVIDER_SITE_OTHER): Payer: BLUE CROSS/BLUE SHIELD | Admitting: Behavioral Health

## 2015-09-08 DIAGNOSIS — E538 Deficiency of other specified B group vitamins: Secondary | ICD-10-CM | POA: Diagnosis not present

## 2015-09-08 MED ORDER — CYANOCOBALAMIN 1000 MCG/ML IJ SOLN
1000.0000 ug | Freq: Once | INTRAMUSCULAR | Status: AC
  Administered 2015-09-08: 1000 ug via INTRAMUSCULAR

## 2015-09-08 NOTE — Progress Notes (Signed)
Pre visit review using our clinic review tool, if applicable. No additional management support is needed unless otherwise documented below in the visit note.  Patient tolerated injection well.  

## 2015-09-12 ENCOUNTER — Other Ambulatory Visit: Payer: Self-pay | Admitting: Family Medicine

## 2015-09-12 DIAGNOSIS — Z1231 Encounter for screening mammogram for malignant neoplasm of breast: Secondary | ICD-10-CM

## 2015-09-14 ENCOUNTER — Ambulatory Visit (HOSPITAL_BASED_OUTPATIENT_CLINIC_OR_DEPARTMENT_OTHER)
Admission: RE | Admit: 2015-09-14 | Discharge: 2015-09-14 | Disposition: A | Discharge status: Home / Self Care | Payer: BLUE CROSS/BLUE SHIELD | Source: Ambulatory Visit | Attending: Family Medicine | Admitting: Family Medicine

## 2015-09-14 DIAGNOSIS — Z1231 Encounter for screening mammogram for malignant neoplasm of breast: Secondary | ICD-10-CM

## 2015-09-26 ENCOUNTER — Ambulatory Visit (HOSPITAL_BASED_OUTPATIENT_CLINIC_OR_DEPARTMENT_OTHER): Payer: BLUE CROSS/BLUE SHIELD

## 2015-10-17 ENCOUNTER — Ambulatory Visit (INDEPENDENT_AMBULATORY_CARE_PROVIDER_SITE_OTHER): Payer: BLUE CROSS/BLUE SHIELD | Admitting: Behavioral Health

## 2015-10-17 DIAGNOSIS — E538 Deficiency of other specified B group vitamins: Secondary | ICD-10-CM | POA: Diagnosis not present

## 2015-10-17 MED ORDER — CYANOCOBALAMIN 1000 MCG/ML IJ SOLN
1000.0000 ug | Freq: Once | INTRAMUSCULAR | Status: AC
  Administered 2015-10-17: 1000 ug via INTRAMUSCULAR

## 2015-10-17 NOTE — Progress Notes (Signed)
Pre visit review using our clinic review tool, if applicable. No additional management support is needed unless otherwise documented below in the visit note.  Patient in office today for B12 injection. IM given in Right Deltoid. Patient tolerated injection well. Next appointment scheduled for 11/14/15 at 2:15 PM.

## 2015-11-14 ENCOUNTER — Ambulatory Visit (INDEPENDENT_AMBULATORY_CARE_PROVIDER_SITE_OTHER): Payer: BLUE CROSS/BLUE SHIELD | Admitting: Behavioral Health

## 2015-11-14 DIAGNOSIS — E538 Deficiency of other specified B group vitamins: Secondary | ICD-10-CM | POA: Diagnosis not present

## 2015-11-14 MED ORDER — CYANOCOBALAMIN 1000 MCG/ML IJ SOLN
1000.0000 ug | Freq: Once | INTRAMUSCULAR | Status: AC
  Administered 2015-11-14: 1000 ug via INTRAMUSCULAR

## 2015-11-14 NOTE — Progress Notes (Signed)
Pre visit review using our clinic review tool, if applicable. No additional management support is needed unless otherwise documented below in the visit note.  Patient in office today for B12 injection. IM given in Right Deltoid. Patient tolerated injection well. Next appointment 12/15/15 at 3:30 PM.

## 2015-12-15 ENCOUNTER — Ambulatory Visit: Payer: BLUE CROSS/BLUE SHIELD

## 2015-12-20 ENCOUNTER — Ambulatory Visit (INDEPENDENT_AMBULATORY_CARE_PROVIDER_SITE_OTHER): Payer: BLUE CROSS/BLUE SHIELD | Admitting: *Deleted

## 2015-12-20 DIAGNOSIS — E538 Deficiency of other specified B group vitamins: Secondary | ICD-10-CM

## 2015-12-20 MED ORDER — CYANOCOBALAMIN 1000 MCG/ML IJ SOLN
1000.0000 ug | Freq: Once | INTRAMUSCULAR | Status: AC
  Administered 2015-12-20: 1000 ug via INTRAMUSCULAR

## 2015-12-20 NOTE — Progress Notes (Signed)
Pre visit review using our clinic review tool, if applicable. No additional management support is needed unless otherwise documented below in the visit note.  Pt tolerated injection well.   Next appt: 01/19/16  Jaiceon Collister J Keigen Caddell, RN   

## 2016-01-14 ENCOUNTER — Other Ambulatory Visit: Payer: Self-pay | Admitting: Family Medicine

## 2016-01-19 ENCOUNTER — Ambulatory Visit (INDEPENDENT_AMBULATORY_CARE_PROVIDER_SITE_OTHER): Payer: BLUE CROSS/BLUE SHIELD | Admitting: Behavioral Health

## 2016-01-19 DIAGNOSIS — E538 Deficiency of other specified B group vitamins: Secondary | ICD-10-CM

## 2016-01-19 MED ORDER — CYANOCOBALAMIN 1000 MCG/ML IJ SOLN
1000.0000 ug | Freq: Once | INTRAMUSCULAR | Status: AC
  Administered 2016-01-19: 1000 ug via INTRAMUSCULAR

## 2016-01-19 NOTE — Progress Notes (Signed)
Pre visit review using our clinic review tool, if applicable. No additional management support is needed unless otherwise documented below in the visit note.  Patient in clinic today for B12 injection. IM given in Left Deltoid. Patient tolerated injection well. Next appointment scheduled for 02/20/16 at 2:45 PM.

## 2016-02-15 ENCOUNTER — Encounter: Payer: Self-pay | Admitting: Family Medicine

## 2016-02-15 ENCOUNTER — Ambulatory Visit (INDEPENDENT_AMBULATORY_CARE_PROVIDER_SITE_OTHER): Payer: BLUE CROSS/BLUE SHIELD | Admitting: Family Medicine

## 2016-02-15 VITALS — BP 105/69 | HR 72 | Temp 98.3°F | Wt 167.2 lb

## 2016-02-15 DIAGNOSIS — J069 Acute upper respiratory infection, unspecified: Secondary | ICD-10-CM

## 2016-02-15 DIAGNOSIS — J0140 Acute pansinusitis, unspecified: Secondary | ICD-10-CM | POA: Diagnosis not present

## 2016-02-15 MED ORDER — FLUTICASONE PROPIONATE 50 MCG/ACT NA SUSP: 2.0000 | Freq: Every day | NASAL | Status: DC

## 2016-02-15 MED ORDER — AMOXICILLIN-POT CLAVULANATE 875-125 MG PO TABS: 1.0000 | ORAL_TABLET | Freq: Two times a day (BID) | ORAL | Status: DC

## 2016-02-15 MED FILL — AMOX-CLAV 875-125 MG TABLET: 875-125 | 10 days supply | Qty: 20 | Fill #0

## 2016-02-15 MED FILL — FLUTICASONE PROP 50 MCG SPR: 50 | 30 days supply | Qty: 16 | Fill #0

## 2016-02-15 NOTE — Progress Notes (Signed)
  Subjective:     Dominique Gonzalez is a 65 y.o. female who presents for evaluation of sinus pain. Symptoms include: congestion, facial pain, headaches, itchy eyes, nasal congestion, post nasal drip, puffiness of the eyes, purulent rhinorrhea, sinus pressure, sneezing and tooth pain. Onset of symptoms was 6 days ago. Symptoms have been gradually worsening since that time. Past history is significant for no history of pneumonia or bronchitis. Patient is a non-smoker.  The following portions of the patient's history were reviewed and updated as appropriate:  She  has a past medical history of HTN (hypertension); Aortic insufficiency; Hyperlipidemia; Diastolic dysfunction; Cardiac murmur; Fatigue; Leg pain, right; Hip pain, right; B12 deficiency; Sinusitis, acute; URI (upper respiratory infection); Osteopenia; and Shoulder pain, left. She  does not have any pertinent problems on file. She  has past surgical history that includes Abdominal hysterectomy and Bilateral oophorectomy. Her family history includes Breast cancer in her mother; Diabetes in her father; Hypertension in her father and mother. She  reports that she quit smoking about 20 years ago. She does not have any smokeless tobacco history on file. She reports that she does not drink alcohol or use illicit drugs. She has a current medication list which includes the following prescription(s): aspirin, fluticasone, lisinopril-hydrochlorothiazide, loratadine, and tramadol, and the following Facility-Administered Medications: cyanocobalamin. Current Outpatient Prescriptions on File Prior to Visit  Medication Sig Dispense Refill  . aspirin 81 MG tablet Take 81 mg by mouth daily.      . fluticasone (FLONASE) 50 MCG/ACT nasal spray Place 2 sprays into both nostrils daily. 16 g 6  . lisinopril-hydrochlorothiazide (PRINZIDE,ZESTORETIC) 10-12.5 MG tablet TAKE 1 TABLET BY MOUTH EVERY DAY 90 tablet 0  . loratadine (CLARITIN) 10 MG tablet Take 1 tablet (10 mg  total) by mouth daily. 30 tablet 11  . traMADol (ULTRAM) 50 MG tablet Take 1 tablet (50 mg total) by mouth every 8 (eight) hours as needed. 30 tablet 0   Current Facility-Administered Medications on File Prior to Visit  Medication Dose Route Frequency Provider Last Rate Last Dose  . cyanocobalamin ((VITAMIN B-12)) injection 1,000 mcg  1,000 mcg Intramuscular Once Ann Held, DO       She is allergic to bactrim..  Review of Systems Pertinent items are noted in HPI.   Objective:    BP 105/69 mmHg  Pulse 72  Temp(Src) 98.3 F (36.8 C) (Oral)  Wt 167 lb 3.2 oz (75.841 kg)  SpO2 97%  LMP  (LMP Unknown) General appearance: alert, cooperative, appears stated age and no distress Head: Normocephalic, without obvious abnormality, atraumatic Eyes: conjunctivae/corneas clear. PERRL, EOM's intact. Fundi benign. Ears: normal TM's and external ear canals both ears Nose: no discharge, green discharge, moderate congestion, sinus tenderness bilateral Throat: lips, mucosa, and tongue normal; teeth and gums normal Neck: mild anterior cervical adenopathy, supple, symmetrical, trachea midline and thyroid not enlarged, symmetric, no tenderness/mass/nodules Lungs: clear to auscultation bilaterally Heart: S1, S2 normal    Assessment:    Acute bacterial sinusitis.    Plan:    Nasal saline sprays. Nasal steroids per medication orders. Antihistamines per medication orders. Augmentin per medication orders. f/u prn

## 2016-02-15 NOTE — Patient Instructions (Signed)

## 2016-02-15 NOTE — Progress Notes (Signed)
Pre visit review using our clinic review tool, if applicable. No additional management support is needed unless otherwise documented below in the visit note. 

## 2016-02-19 ENCOUNTER — Encounter: Payer: Self-pay | Admitting: Cardiovascular Disease

## 2016-02-19 ENCOUNTER — Ambulatory Visit (INDEPENDENT_AMBULATORY_CARE_PROVIDER_SITE_OTHER): Payer: BLUE CROSS/BLUE SHIELD | Admitting: Cardiovascular Disease

## 2016-02-19 VITALS — BP 124/76 | HR 69 | Ht 64.0 in | Wt 168.8 lb

## 2016-02-19 DIAGNOSIS — I351 Nonrheumatic aortic (valve) insufficiency: Secondary | ICD-10-CM

## 2016-02-19 DIAGNOSIS — I340 Nonrheumatic mitral (valve) insufficiency: Secondary | ICD-10-CM

## 2016-02-19 NOTE — Patient Instructions (Signed)
Medication Instructions:  Your physician recommends that you continue on your current medications as directed. Please refer to the Current Medication list given to you today.   Labwork: none  Testing/Procedures: Your physician has requested that you have an echocardiogram. Echocardiography is a painless test that uses sound waves to create images of your heart. It provides your doctor with information about the size and shape of your heart and how well your heart's chambers and valves are working. This procedure takes approximately one hour. There are no restrictions for this procedure.  To be done in one year--week or 2 prior to appt with Dr. Angelena Form    Follow-Up: Your physician wants you to follow-up in: 12 months.  You will receive a reminder letter in the mail two months in advance. If you don't receive a letter, please call our office to schedule the follow-up appointment.   Any Other Special Instructions Will Be Listed Below (If Applicable).     If you need a refill on your cardiac medications before your next appointment, please call your pharmacy.

## 2016-02-19 NOTE — Progress Notes (Signed)
Chief Complaint  Patient presents with  . Follow-up    History of Present Illness: 65 yo WF with history of HTN and borderline hyperlipidemia here today for follow up. Initially seen in 2011 for evaluation of abnormal echocardiogram. Echo was ordered in workup of a murmur. Her echo in 2011 showed normal LV systolic function, diastolic dysfunction, mild AI, small effusion and possible epicardial mass/aortic root mass. I ordered a cardiac MRI which showed prominent epicardial fat pad with no evidence of aortic or pericardial tumor or mass. Echo May 2016 with normal LV function, mild LVH, mild to moderate AI, mild MR.   She is here today for follow up. She denies chest pain or SOB. She walks for several miles per day. No LE edema. Overall feeling great.   Primary Care Physician:  Ann Held, DO   Past Medical History  Diagnosis Date  . HTN (hypertension)   . Aortic insufficiency     mild  . Hyperlipidemia   . Diastolic dysfunction   . Cardiac murmur   . Fatigue   . Leg pain, right   . Hip pain, right   . B12 deficiency   . Sinusitis, acute     NOS  . URI (upper respiratory infection)   . Osteopenia   . Shoulder pain, left     Past Surgical History  Procedure Laterality Date  . Abdominal hysterectomy      age 76  . Bilateral oophorectomy      Current Outpatient Prescriptions  Medication Sig Dispense Refill  . amoxicillin-clavulanate (AUGMENTIN) 875-125 MG tablet Take 1 tablet by mouth 2 (two) times daily. 20 tablet 0  . aspirin 81 MG tablet Take 81 mg by mouth daily.      . fluticasone (FLONASE) 50 MCG/ACT nasal spray Place 2 sprays into both nostrils daily. 16 g 6  . lisinopril-hydrochlorothiazide (PRINZIDE,ZESTORETIC) 10-12.5 MG tablet TAKE 1 TABLET BY MOUTH EVERY DAY 90 tablet 0  . loratadine (CLARITIN) 10 MG tablet Take 1 tablet (10 mg total) by mouth daily. 30 tablet 11   Current Facility-Administered Medications  Medication Dose Route Frequency Provider  Last Rate Last Dose  . cyanocobalamin ((VITAMIN B-12)) injection 1,000 mcg  1,000 mcg Intramuscular Once Rosalita Chessman Chase, DO        Allergies  Allergen Reactions  . Bactrim [Sulfamethoxazole-Trimethoprim]     Social History   Social History  . Marital Status: Married    Spouse Name: N/A  . Number of Children: 2  . Years of Education: N/A   Occupational History  . Not on file.   Social History Main Topics  . Smoking status: Former Smoker    Quit date: 01/10/1996  . Smokeless tobacco: Not on file     Comment: smoked for 10 years,   . Alcohol Use: No  . Drug Use: No  . Sexual Activity: Not on file   Other Topics Concern  . Not on file   Social History Narrative    Family History  Problem Relation Age of Onset  . Hypertension Father   . Diabetes Father   . Hypertension Mother   . Breast cancer Mother     Review of Systems:  As stated in the HPI and otherwise negative.   BP 124/76 mmHg  Pulse 69  Ht 5\' 4"  (1.626 m)  Wt 168 lb 12.8 oz (76.567 kg)  BMI 28.96 kg/m2  LMP  (LMP Unknown)  Physical Examination: General: Well developed, well nourished, NAD  HEENT: OP clear, mucus membranes moist SKIN: warm, dry. No rashes. Neuro: No focal deficits Musculoskeletal: Muscle strength 5/5 all ext Psychiatric: Mood and affect normal Neck: No JVD, no carotid bruits, no thyromegaly, no lymphadenopathy. Lungs:Clear bilaterally, no wheezes, rhonci, crackles Cardiovascular: Regular rate and rhythm. No murmurs, gallops or rubs. Abdomen:Soft. Bowel sounds present. Non-tender.  Extremities: No lower extremity edema. Pulses are 2 + in the bilateral DP/PT.  Echo 01/09/15: Left ventricle: The cavity size was normal. Systolic function was  normal. The estimated ejection fraction was in the range of 55%  to 60%. Wall motion was normal; there were no regional wall  motion abnormalities. Doppler parameters are consistent with  abnormal left ventricular relaxation (grade 1  diastolic  dysfunction). - Aortic valve: There was mild to moderate regurgitation directed  centrally in the LVOT. 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 It may appear larger than it is in reality  due to a tangential section and side-lobe artifact. It is only  appreciated in parasternal long axis. An artifact cannot be  excluded. TEE or cardiac MRI would be superior modalities for  evaluation. It was not seen on the study in 2014. - Mitral valve: There was mild regurgitation.  EKG:  EKG is ordered today. The ekg ordered today demonstrates  NSR, rate 69 bpm.   Recent Labs: 05/02/2015: BUN 9; Creatinine, Ser 0.74; Hemoglobin 13.6; Platelets 276.0; Potassium 4.3; Sodium 141; TSH 1.37   Lipid Panel    Component Value Date/Time   CHOL 187 08/30/2011 1041   TRIG 93.0 08/30/2011 1041   HDL 41.80 08/30/2011 1041   CHOLHDL 4 08/30/2011 1041   VLDL 18.6 08/30/2011 1041   LDLCALC 127* 08/30/2011 1041   LDLDIRECT 149.3 07/28/2007 0000     Wt Readings from Last 3 Encounters:  02/19/16 168 lb 12.8 oz (76.567 kg)  02/15/16 167 lb 3.2 oz (75.841 kg)  07/06/15 166 lb 9.6 oz (75.569 kg)     Other studies Reviewed: Additional studies/ records that were reviewed today include: . Review of the above records demonstrates:    Assessment and Plan:   1. AORTIC VALVE INSUFFICIENCY:  Mild to moderate by echo May 2016. Will repeat echo June 2018.   2. Mitral valve regurgitation: Mild by echo May 2016.    Current medicines are reviewed at length with the patient today.  The patient does not have concerns regarding medicines.  The following changes have been made:  no change  Labs/ tests ordered today include:   Orders Placed This Encounter  Procedures  . EKG 12-Lead  . ECHO COMPLETE    Disposition:   FU with me in 12  months  Signed, Lauree Chandler,  MD 02/19/2016 9:09 AM    [facility] [address], [place], [place]  [address] Phone: [phone]; Fax: [phone]

## 2016-02-20 ENCOUNTER — Ambulatory Visit: Payer: BLUE CROSS/BLUE SHIELD | Admitting: Behavioral Health

## 2016-02-23 ENCOUNTER — Ambulatory Visit (INDEPENDENT_AMBULATORY_CARE_PROVIDER_SITE_OTHER): Payer: BLUE CROSS/BLUE SHIELD

## 2016-02-23 DIAGNOSIS — E538 Deficiency of other specified B group vitamins: Secondary | ICD-10-CM | POA: Diagnosis not present

## 2016-02-23 MED ORDER — CYANOCOBALAMIN 1000 MCG/ML IJ SOLN
1000.0000 ug | Freq: Once | INTRAMUSCULAR | Status: AC
  Administered 2016-02-23: 1000 ug via INTRAMUSCULAR

## 2016-02-23 NOTE — Progress Notes (Signed)
Pre visit review using our clinic tool,if applicable. No additional management support is needed unless otherwise documented below in the visit note.   Patient  In fore B12 injection. Given IM Right deltoid. Patient tolerated well.

## 2016-02-29 ENCOUNTER — Telehealth: Payer: Self-pay | Admitting: Family Medicine

## 2016-02-29 MED ORDER — FLUCONAZOLE 150 MG PO TABS: 150.0000 mg | ORAL_TABLET | ORAL | Status: DC

## 2016-02-29 NOTE — Telephone Encounter (Signed)
Augmentin prescribed on 02/15/16 for 10 days. Please advise.     KP

## 2016-02-29 NOTE — Telephone Encounter (Signed)
Called Pt, she is not home. Informed receiver that Pt had called and requested a specific medication (medication name not given to receiver) and that it had been sent to [facility] on [address]. Receiver verbalized understanding.

## 2016-02-29 NOTE — Telephone Encounter (Signed)
°  Relationship to patient: Self  Can be reached: [phone]   Pharmacy:  [id number] - [place], [place] (Phone) [phone] (Fax)         Reason for call: Patient is requesting a Diflucan be called in because she has been on antibiotics for a week.

## 2016-02-29 NOTE — Telephone Encounter (Signed)
OK to rx Diflucan 150 mg tab 1 t ab po q week x 2 weeks. Start a probiotic daily

## 2016-03-22 ENCOUNTER — Ambulatory Visit (INDEPENDENT_AMBULATORY_CARE_PROVIDER_SITE_OTHER): Payer: BLUE CROSS/BLUE SHIELD | Admitting: Behavioral Health

## 2016-03-22 DIAGNOSIS — E538 Deficiency of other specified B group vitamins: Secondary | ICD-10-CM

## 2016-03-22 MED ORDER — CYANOCOBALAMIN 1000 MCG/ML IJ SOLN
1000.0000 ug | Freq: Once | INTRAMUSCULAR | Status: AC
  Administered 2016-03-22: 1000 ug via INTRAMUSCULAR

## 2016-03-22 NOTE — Progress Notes (Signed)
Pre visit review using our clinic review tool, if applicable. No additional management support is needed unless otherwise documented below in the visit note.  Patient in office today for B12 injection. IM given in Right Deltoid. Patient tolerated injection well. Next appointment scheduled for 04/23/16 at 2:00 PM.

## 2016-04-11 ENCOUNTER — Ambulatory Visit (HOSPITAL_BASED_OUTPATIENT_CLINIC_OR_DEPARTMENT_OTHER)
Admission: RE | Admit: 2016-04-11 | Discharge: 2016-04-11 | Disposition: A | Discharge status: Home / Self Care | Payer: BLUE CROSS/BLUE SHIELD | Source: Ambulatory Visit | Attending: Family Medicine | Admitting: Family Medicine

## 2016-04-11 ENCOUNTER — Other Ambulatory Visit: Payer: Self-pay | Admitting: Family Medicine

## 2016-04-11 ENCOUNTER — Encounter (HOSPITAL_BASED_OUTPATIENT_CLINIC_OR_DEPARTMENT_OTHER): Payer: Self-pay

## 2016-04-11 ENCOUNTER — Ambulatory Visit (HOSPITAL_BASED_OUTPATIENT_CLINIC_OR_DEPARTMENT_OTHER): Admission: RE | Admit: 2016-04-11 | Payer: BLUE CROSS/BLUE SHIELD | Source: Ambulatory Visit

## 2016-04-11 ENCOUNTER — Ambulatory Visit (INDEPENDENT_AMBULATORY_CARE_PROVIDER_SITE_OTHER): Payer: BLUE CROSS/BLUE SHIELD | Admitting: Family Medicine

## 2016-04-11 VITALS — BP 100/60 | HR 87 | Temp 97.8°F | Wt 165.8 lb

## 2016-04-11 DIAGNOSIS — M79661 Pain in right lower leg: Secondary | ICD-10-CM | POA: Insufficient documentation

## 2016-04-11 DIAGNOSIS — M25561 Pain in right knee: Secondary | ICD-10-CM | POA: Diagnosis not present

## 2016-04-11 NOTE — Patient Instructions (Signed)

## 2016-04-11 NOTE — Progress Notes (Signed)
Pre visit review using our clinic review tool, if applicable. No additional management support is needed unless otherwise documented below in the visit note. 

## 2016-04-11 NOTE — Progress Notes (Signed)
Patient ID: Dominique Gonzalez, female    DOB: 07-03-1951  Age: 65 y.o. MRN: [id number]    Subjective:  Subjective  HPI Dominique Gonzalez presents for f/u R leg pain and swelling around knee x 6 days,  It has worsened since Nov.  No injury.    Review of Systems  Constitutional: Negative for appetite change, diaphoresis, fatigue and unexpected weight change.  Eyes: Negative for pain, redness and visual disturbance.  Respiratory: Negative for cough, chest tightness, shortness of breath and wheezing.   Cardiovascular: Negative for chest pain, palpitations and leg swelling.  Endocrine: Negative for cold intolerance, heat intolerance, polydipsia, polyphagia and polyuria.  Genitourinary: Negative for difficulty urinating, dysuria and frequency.  Neurological: Negative for dizziness, light-headedness, numbness and headaches.    History Past Medical History:  Diagnosis Date  . Aortic insufficiency    mild  . B12 deficiency   . Cardiac murmur   . Diastolic dysfunction   . Fatigue   . Hip pain, right   . HTN (hypertension)   . Hyperlipidemia   . Leg pain, right   . Osteopenia   . Shoulder pain, left   . Sinusitis, acute    NOS  . URI (upper respiratory infection)     She has a past surgical history that includes Abdominal hysterectomy and Bilateral oophorectomy.   Her family history includes Breast cancer in her mother; Diabetes in her father; Hypertension in her father and mother.She reports that she quit smoking about 20 years ago. She does not have any smokeless tobacco history on file. She reports that she does not drink alcohol or use drugs.  Current Outpatient Prescriptions on File Prior to Visit  Medication Sig Dispense Refill  . aspirin 81 MG tablet Take 81 mg by mouth daily.      Marland Kitchen lisinopril-hydrochlorothiazide (PRINZIDE,ZESTORETIC) 10-12.5 MG tablet TAKE 1 TABLET BY MOUTH EVERY DAY 90 tablet 0   Current Facility-Administered Medications on File Prior to Visit  Medication  Dose Route Frequency Provider Last Rate Last Dose  . cyanocobalamin ((VITAMIN B-12)) injection 1,000 mcg  1,000 mcg Intramuscular Once Ann Held, DO         Objective:  Objective  Physical Exam  Constitutional: She is oriented to person, place, and time. She appears well-developed and well-nourished.  HENT:  Head: Normocephalic and atraumatic.  Eyes: Conjunctivae and EOM are normal.  Neck: Normal range of motion. Neck supple. No JVD present. Carotid bruit is not present. No thyromegaly present.  Cardiovascular: Normal rate, regular rhythm and normal heart sounds.   No murmur heard. Pulmonary/Chest: Effort normal and breath sounds normal. No respiratory distress. She has no wheezes. She has no rales. She exhibits no tenderness.  Musculoskeletal: She exhibits no edema.       Right knee: She exhibits normal range of motion. Tenderness found.       Legs: Neurological: She is alert and oriented to person, place, and time.  Psychiatric: She has a normal mood and affect. Her behavior is normal. Judgment and thought content normal.  Nursing note and vitals reviewed.  BP 100/60 (BP Location: Left Arm, Patient Position: Standing, Cuff Size: Normal)   Pulse 87   Temp 97.8 F (36.6 C) (Oral)   Wt 165 lb 12.8 oz (75.2 kg)   LMP  (LMP Unknown)   SpO2 97%   BMI 28.46 kg/m  Wt Readings from Last 3 Encounters:  04/11/16 165 lb 12.8 oz (75.2 kg)  02/19/16 168 lb 12.8 oz (76.6 kg)  02/15/16 167 lb 3.2 oz (75.8 kg)     Lab Results  Component Value Date   WBC 7.2 05/02/2015   HGB 13.6 05/02/2015   HCT 40.3 05/02/2015   PLT 276.0 05/02/2015   GLUCOSE 82 05/02/2015   CHOL 187 08/30/2011   TRIG 93.0 08/30/2011   HDL 41.80 08/30/2011   LDLDIRECT 149.3 07/28/2007   LDLCALC 127 (H) 08/30/2011   ALT 18 05/03/2014   AST 25 05/03/2014   NA 141 05/02/2015   K 4.3 05/02/2015   CL 105 05/02/2015   CREATININE 0.74 05/02/2015   BUN 9 05/02/2015   CO2 25 05/02/2015   TSH 1.37  05/02/2015    No results found.   Assessment & Plan:  Plan  I have discontinued Ms. Niesen's loratadine, amoxicillin-clavulanate, fluticasone, and fluconazole. I am also having her maintain her aspirin and lisinopril-hydrochlorothiazide. We will continue to administer cyanocobalamin.  No orders of the defined types were placed in this encounter.   Problem List Items Addressed This Visit    None    Visit Diagnoses    Calf pain, right    -  Primary   Relevant Orders   US Venous Img Lower Unilateral Right (Completed)   Knee pain, acute, right        consider ortho if pain con't and doppler is neg  Follow-up: No Follow-up on file.  Ann Held, DO

## 2016-04-12 ENCOUNTER — Encounter: Payer: Self-pay | Admitting: Family Medicine

## 2016-04-12 ENCOUNTER — Other Ambulatory Visit: Payer: Self-pay

## 2016-04-12 DIAGNOSIS — M25569 Pain in unspecified knee: Secondary | ICD-10-CM

## 2016-04-16 ENCOUNTER — Other Ambulatory Visit: Payer: Self-pay | Admitting: Family Medicine

## 2016-04-17 ENCOUNTER — Ambulatory Visit: Payer: BLUE CROSS/BLUE SHIELD | Admitting: Family Medicine

## 2016-04-23 ENCOUNTER — Ambulatory Visit (INDEPENDENT_AMBULATORY_CARE_PROVIDER_SITE_OTHER): Payer: BLUE CROSS/BLUE SHIELD | Admitting: *Deleted

## 2016-04-23 ENCOUNTER — Encounter: Payer: Self-pay | Admitting: Family Medicine

## 2016-04-23 ENCOUNTER — Ambulatory Visit (INDEPENDENT_AMBULATORY_CARE_PROVIDER_SITE_OTHER): Payer: BLUE CROSS/BLUE SHIELD | Admitting: Family Medicine

## 2016-04-23 DIAGNOSIS — E538 Deficiency of other specified B group vitamins: Secondary | ICD-10-CM | POA: Diagnosis not present

## 2016-04-23 DIAGNOSIS — M25561 Pain in right knee: Secondary | ICD-10-CM

## 2016-04-23 MED ORDER — CYANOCOBALAMIN 1000 MCG/ML IJ SOLN
1000.0000 ug | Freq: Once | INTRAMUSCULAR | Status: AC
  Administered 2016-04-23: 1000 ug via INTRAMUSCULAR

## 2016-04-23 NOTE — Progress Notes (Signed)
Pre visit review using our clinic review tool, if applicable. No additional management support is needed unless otherwise documented below in the visit note.  Patient tolerated injection well.  Next appointment: 05/21/16  Dorrene German, RN

## 2016-04-23 NOTE — Patient Instructions (Signed)
Your knee pain is due to arthritis, less likely a degenerative meniscus tear. These are the different medications you can take for this: Tylenol 500mg  1-2 tabs three times a day for pain. Advil 600mg  three times a day with food OR aleve 2 tabs twice a day with food for pain and inflammation. Glucosamine sulfate 750mg  twice a day is a supplement that may help. Capsaicin, aspercreme, or biofreeze topically up to four times a day may also help with pain. Cortisone injections are an option. If cortisone injections do not help, there are different types of shots that may help but they take longer to take effect. It's important that you continue to stay active. Straight leg raises, knee extensions 3 sets of 10 once a day (add ankle weight if these become too easy). Consider physical therapy to strengthen muscles around the joint that hurts to take pressure off of the joint itself. Shoe inserts with good arch support may be helpful. Heat or ice 15 minutes at a time 3-4 times a day as needed to help with pain. Water aerobics and cycling with low resistance are the best two types of exercise for arthritis. Follow up with me in 1 month or as needed.

## 2016-04-25 DIAGNOSIS — M25561 Pain in right knee: Secondary | ICD-10-CM | POA: Insufficient documentation

## 2016-04-25 NOTE — Progress Notes (Signed)
PCP and consultation requested by: Ann Held, DO  Subjective:   HPI: Patient is a 65 y.o. female here for right knee pain.  Patient denies known injury or trauma. Reports off and on pain in right knee but more consistent past month. Pain level is 5/10, more dull. Sometimes goes into whole leg. Worse when sitting a long time then getting up. Knee can catch and is painful. Worse with walking a long time too - can feel like knee will give out. Knee aches at night. Tried advil, icing. Had doppler u/s negative for DVT. No numbness, skin changes.  Past Medical History:  Diagnosis Date  . Aortic insufficiency    mild  . B12 deficiency   . Cardiac murmur   . Diastolic dysfunction   . Fatigue   . Hip pain, right   . HTN (hypertension)   . Hyperlipidemia   . Leg pain, right   . Osteopenia   . Shoulder pain, left   . Sinusitis, acute    NOS  . URI (upper respiratory infection)     Current Outpatient Prescriptions on File Prior to Visit  Medication Sig Dispense Refill  . aspirin 81 MG tablet Take 81 mg by mouth daily.      Marland Kitchen lisinopril-hydrochlorothiazide (PRINZIDE,ZESTORETIC) 10-12.5 MG tablet TAKE 1 TABLET BY MOUTH EVERY DAY 90 tablet 1   Current Facility-Administered Medications on File Prior to Visit  Medication Dose Route Frequency Provider Last Rate Last Dose  . cyanocobalamin ((VITAMIN B-12)) injection 1,000 mcg  1,000 mcg Intramuscular Once Ann Held, DO        Past Surgical History:  Procedure Laterality Date  . ABDOMINAL HYSTERECTOMY     age 62  . BILATERAL OOPHORECTOMY      Allergies  Allergen Reactions  . Bactrim [Sulfamethoxazole-Trimethoprim]     Social History   Social History  . Marital status: Married    Spouse name: N/A  . Number of children: 2  . Years of education: N/A   Occupational History  . Not on file.   Social History Main Topics  . Smoking status: Former Smoker    Quit date: 01/10/1996  . Smokeless tobacco:  Never Used     Comment: smoked for 10 years,   . Alcohol use No  . Drug use: No  . Sexual activity: Not on file   Other Topics Concern  . Not on file   Social History Narrative  . No narrative on file    Family History  Problem Relation Age of Onset  . Hypertension Father   . Diabetes Father   . Hypertension Mother   . Breast cancer Mother     BP 119/78   Pulse 83   Ht 5\' 3"  (1.6 m)   Wt 165 lb (74.8 kg)   LMP  (LMP Unknown)   BMI 29.23 kg/m   Review of Systems: See HPI above.    Objective:  Physical Exam:  Gen: NAD, comfortable in exam room  Right knee: No gross deformity, ecchymoses, effusion. Minimal tenderness medial, lateral joint lines. FROM. Negative ant/post drawers. Negative valgus/varus testing. Negative lachmanns. Negative mcmurrays, apleys, patellar apprehension. NV intact distally.  Left knee: FROM without pain.    Assessment & Plan:  1. Right knee pain - consistent with flare of DJD.  Discussed tylenol, nsaids, glucosamine, topical medications.  Shown home exercises to do daily.  She will consider physical therapy, injection if not improving.  F/u in 1 month.

## 2016-04-25 NOTE — Assessment & Plan Note (Signed)
consistent with flare of DJD.  Discussed tylenol, nsaids, glucosamine, topical medications.  Shown home exercises to do daily.  She will consider physical therapy, injection if not improving.  F/u in 1 month.

## 2016-04-28 ENCOUNTER — Emergency Department (HOSPITAL_BASED_OUTPATIENT_CLINIC_OR_DEPARTMENT_OTHER)
Admission: EM | Admit: 2016-04-28 | Discharge: 2016-04-28 | Disposition: A | Discharge status: Home / Self Care | Payer: BLUE CROSS/BLUE SHIELD | Attending: Emergency Medicine | Admitting: Emergency Medicine

## 2016-04-28 ENCOUNTER — Encounter (HOSPITAL_BASED_OUTPATIENT_CLINIC_OR_DEPARTMENT_OTHER): Payer: Self-pay | Admitting: Emergency Medicine

## 2016-04-28 ENCOUNTER — Emergency Department (HOSPITAL_BASED_OUTPATIENT_CLINIC_OR_DEPARTMENT_OTHER): Payer: BLUE CROSS/BLUE SHIELD

## 2016-04-28 DIAGNOSIS — X58XXXA Exposure to other specified factors, initial encounter: Secondary | ICD-10-CM | POA: Diagnosis not present

## 2016-04-28 DIAGNOSIS — S86911A Strain of unspecified muscle(s) and tendon(s) at lower leg level, right leg, initial encounter: Secondary | ICD-10-CM

## 2016-04-28 DIAGNOSIS — Y929 Unspecified place or not applicable: Secondary | ICD-10-CM | POA: Diagnosis not present

## 2016-04-28 DIAGNOSIS — S86811A Strain of other muscle(s) and tendon(s) at lower leg level, right leg, initial encounter: Secondary | ICD-10-CM | POA: Insufficient documentation

## 2016-04-28 DIAGNOSIS — Y939 Activity, unspecified: Secondary | ICD-10-CM | POA: Diagnosis not present

## 2016-04-28 DIAGNOSIS — S8991XA Unspecified injury of right lower leg, initial encounter: Secondary | ICD-10-CM | POA: Diagnosis present

## 2016-04-28 DIAGNOSIS — Z79899 Other long term (current) drug therapy: Secondary | ICD-10-CM | POA: Insufficient documentation

## 2016-04-28 DIAGNOSIS — Z87891 Personal history of nicotine dependence: Secondary | ICD-10-CM | POA: Diagnosis not present

## 2016-04-28 DIAGNOSIS — I10 Essential (primary) hypertension: Secondary | ICD-10-CM | POA: Diagnosis not present

## 2016-04-28 DIAGNOSIS — Z7982 Long term (current) use of aspirin: Secondary | ICD-10-CM | POA: Diagnosis not present

## 2016-04-28 DIAGNOSIS — Y999 Unspecified external cause status: Secondary | ICD-10-CM | POA: Insufficient documentation

## 2016-04-28 MED ORDER — KETOROLAC TROMETHAMINE 60 MG/2ML IM SOLN
30.0000 mg | Freq: Once | INTRAMUSCULAR | Status: AC
  Administered 2016-04-28: 30 mg via INTRAMUSCULAR
  Filled 2016-04-28: qty 2

## 2016-04-28 MED ORDER — HYDROCODONE-ACETAMINOPHEN 5-325 MG PO TABS
1.0000 | ORAL_TABLET | Freq: Once | ORAL | Status: AC
  Administered 2016-04-28: 1 via ORAL
  Filled 2016-04-28: qty 1

## 2016-04-28 MED ORDER — HYDROCODONE-ACETAMINOPHEN 5-325 MG PO TABS: 1.0000 | ORAL_TABLET | Freq: Four times a day (QID) | ORAL | 0 refills | Status: DC | PRN

## 2016-04-28 NOTE — ED Notes (Signed)
Pt given d/c instructions as per chart. Rx x 1 with narc/tyl precautions. Verbalizes understanding. No questions.

## 2016-04-28 NOTE — ED Provider Notes (Signed)
[place] DEPT Provider Note   CSN: [id number] Arrival date & time: 04/28/16  1808 By signing my name below, I, Georgette Shell, attest that this documentation has been prepared under the direction and in the presence of Sherwood Gambler, MD. Electronically Signed: Georgette Shell, ED Scribe. 04/28/16. 7:51 PM.  History   Chief Complaint Chief Complaint  Patient presents with  . Leg Pain   HPI Comments: Dominique Gonzalez is a 65 y.o. female who presents to the Emergency Department complaining of sudden onset, constant, 10/10 aching right knee and leg pain with swelling onset one month ago, worsening today. Pt reports that she was standing up today around 3 pm today and heard her knee crack. Pt states pain is exacerbated with extending and bending her leg. Pt has taken Advil around 1:30 pm with no relief to the pain. Pt denies numbness or paresthesia.   The history is provided by the patient. No language interpreter was used.    Past Medical History:  Diagnosis Date  . Aortic insufficiency    mild  . B12 deficiency   . Cardiac murmur   . Diastolic dysfunction   . Fatigue   . Hip pain, right   . HTN (hypertension)   . Hyperlipidemia   . Leg pain, right   . Osteopenia   . Shoulder pain, left   . Sinusitis, acute    NOS  . URI (upper respiratory infection)     Patient Active Problem List   Diagnosis Date Noted  . Right knee pain 04/25/2016  . Dysuria 04/25/2015  . Acute upper respiratory infection 04/25/2015  . Acute frontal sinusitis 07/13/2014  . UTI (urinary tract infection) 02/02/2014  . Multiple thyroid nodules 05/25/2013  . Fatigue 08/30/2011  . URI (upper respiratory infection) 01/16/2011  . VAGINITIS, ATROPHIC 10/09/2010  . AORTIC VALVE DISORDERS 01/04/2010  . ECHOCARDIOGRAM, ABNORMAL 12/12/2009  . DIASTOLIC DYSFUNCTION 99991111  . B12 DEFICIENCY 10/19/2009  . FATIGUE 10/09/2009  . CARDIAC MURMUR 10/09/2009  . SINUSITIS - ACUTE-NOS 11/10/2008  . URI 11/10/2008  .  LEG PAIN, RIGHT 10/14/2008  . HIP PAIN, RIGHT 10/06/2008  . HYPERLIPIDEMIA 02/25/2008  . OSTEOPENIA 07/28/2007  . HYPERTENSION 02/25/2007  . SHOULDER PAIN, LEFT 02/25/2007    Past Surgical History:  Procedure Laterality Date  . ABDOMINAL HYSTERECTOMY     age 65  . BILATERAL OOPHORECTOMY      OB History    No data available       Home Medications    Prior to Admission medications   Medication Sig Start Date End Date Taking? Authorizing Provider  aspirin 81 MG tablet Take 81 mg by mouth daily.      Historical Provider, MD  HYDROcodone-acetaminophen (NORCO) 5-325 MG tablet Take 1-2 tablets by mouth every 6 (six) hours as needed for severe pain. 04/28/16   Sherwood Gambler, MD  lisinopril-hydrochlorothiazide (PRINZIDE,ZESTORETIC) 10-12.5 MG tablet TAKE 1 TABLET BY MOUTH EVERY DAY 04/16/16   Ann Held, DO    Family History Family History  Problem Relation Age of Onset  . Hypertension Father   . Diabetes Father   . Hypertension Mother   . Breast cancer Mother     Social History Social History  Substance Use Topics  . Smoking status: Former Smoker    Quit date: 01/10/1996  . Smokeless tobacco: Never Used     Comment: smoked for 10 years,   . Alcohol use No     Allergies   Bactrim [sulfamethoxazole-trimethoprim]   Review of  Systems Review of Systems   Physical Exam Updated Vital Signs BP 117/65 (BP Location: Right Arm)   Pulse 64   Temp 97.8 F (36.6 C) (Oral)   Resp 20   Ht 5\' 3"  (1.6 m)   Wt 160 lb (72.6 kg)   LMP  (LMP Unknown)   SpO2 98%   BMI 28.34 kg/m   Physical Exam  Constitutional: She is oriented to person, place, and time. She appears well-developed and well-nourished.  HENT:  Head: Normocephalic and atraumatic.  Right Ear: External ear normal.  Left Ear: External ear normal.  Nose: Nose normal.  Eyes: Right eye exhibits no discharge. Left eye exhibits no discharge.  Cardiovascular: Normal rate, regular rhythm and normal heart  sounds.   Pulses:      Dorsalis pedis pulses are 2+ on the right side, and 2+ on the left side.  Pulmonary/Chest: Effort normal and breath sounds normal.  Abdominal: Soft. There is no tenderness.  Musculoskeletal:       Right hip: She exhibits tenderness (Mild). She exhibits normal range of motion.       Right knee: She exhibits decreased range of motion (Mild) and swelling (Medially). She exhibits no erythema. Tenderness found. Medial joint line tenderness noted.       Right ankle: She exhibits normal range of motion and no swelling. No tenderness.       Right upper leg: She exhibits tenderness. She exhibits no bony tenderness.       Right lower leg: She exhibits no tenderness.       Right foot: There is no tenderness.  Neurological: She is alert and oriented to person, place, and time.  Skin: Skin is warm and dry.  Nursing note and vitals reviewed.  ED Treatments / Results  DIAGNOSTIC STUDIES: Oxygen Saturation is 100% on RA, normal by my interpretation.    COORDINATION OF CARE: 7:47 PM Discussed treatment plan with pt at bedside which includes x-ray and pt agreed to plan.  Labs (all labs ordered are listed, but only abnormal results are displayed) Labs Reviewed - No data to display  EKG  EKG Interpretation None       Radiology Dg Knee Complete 4 Views Right  Result Date: 04/28/2016 CLINICAL DATA:  Stood from a seated position on her sofa this evening and heard her RIGHT leg pop, unable to bear weight since on RIGHT leg, having lateral RIGHT hip pain, anterior and posterior knee pain, intermittent RIGHT leg numbness, history hypertension, hyperlipidemia, initial encounter EXAM: RIGHT KNEE - COMPLETE 4+ VIEW COMPARISON:  None. FINDINGS: Osseous demineralization. Diffuse joint space narrowing with spur formation at medial compartment. No acute fracture, dislocation or bone destruction. No knee joint effusion. 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 No acute abnormalities. Electronically Signed   By: Lavonia Dana M.D.   On: 04/28/2016 21:23   Dg Hip Unilat With Pelvis 2-3 Views Right  Result Date: 04/28/2016 CLINICAL DATA:  Stood from a seated position on her sofa this evening and heard her RIGHT leg pop, unable to bear weight since on RIGHT leg, having lateral RIGHT hip pain, anterior and posterior knee pain, intermittent RIGHT leg numbness, history hypertension, hyperlipidemia, initial encounter EXAM: DG HIP (WITH OR WITHOUT PELVIS) 2-3V RIGHT COMPARISON:  None. FINDINGS: Diffuse osseous demineralization. Minimal narrowing of the hip joints. Pelvis intact. No acute fracture, dislocation or bone destruction. IMPRESSION: Osseous demineralization without acute bony abnormalities. Electronically Signed   By: Lavonia Dana M.D.   On:  04/28/2016 21:19   Dg Femur Min 2 Views Right  Result Date: 04/28/2016 CLINICAL DATA:  Stood from a seated position on her sofa this evening and heard her RIGHT leg pop, unable to bear weight since on RIGHT leg, having lateral RIGHT hip pain, anterior and posterior knee pain, intermittent RIGHT leg numbness, history hypertension, hyperlipidemia, initial encounter EXAM: RIGHT FEMUR 2 VIEWS COMPARISON:  None. FINDINGS: RIGHT knee and RIGHT hip radiographs reported separately. Osseous demineralization. No acute fracture, dislocation or bone destruction. IMPRESSION: No acute osseous abnormalities. Electronically Signed   By: Lavonia Dana M.D.   On: 04/28/2016 21:24    Procedures Procedures (including critical care time)  Medications Ordered in ED Medications  ketorolac (TORADOL) injection 30 mg (30 mg Intramuscular Given 04/28/16 2012)  HYDROcodone-acetaminophen (NORCO/VICODIN) 5-325 MG per tablet 1 tablet (1 tablet Oral Given 04/28/16 2244)     Initial Impression / Assessment and Plan / ED Course  I have reviewed the triage vital signs and the nursing notes.  Pertinent labs & imaging results that were available during  my care of the patient were reviewed by me and considered in my medical decision making (see chart for details).  Clinical Course    Patient has improved after IM Toradol. I was able to get her up and she is able to walk but with a limp. However she is bearing weight. Highly doubt occult fracture. Short course of Norco to help with severe pain, follow-up with Dr. Barbaraann Barthel. Neurovascularly intact.  Final Clinical Impressions(s) / ED Diagnoses   Final diagnoses:  Knee strain, right, initial encounter    New Prescriptions Discharge Medication List as of 04/28/2016 10:33 PM    START taking these medications   Details  HYDROcodone-acetaminophen (NORCO) 5-325 MG tablet Take 1-2 tablets by mouth every 6 (six) hours as needed for severe pain., Starting Sun 04/28/2016, Print       I personally performed the services described in this documentation, which was scribed in my presence. The recorded information has been reviewed and is accurate.    Sherwood Gambler, MD 04/29/16 1501

## 2016-04-28 NOTE — ED Triage Notes (Addendum)
Patient states that she had had pain to her right knee and leg x 1 month. Reports that today she was standing up and hurt her knee crack. Now has pain to her right knee

## 2016-05-01 ENCOUNTER — Telehealth: Payer: Self-pay | Admitting: Family Medicine

## 2016-05-02 NOTE — Telephone Encounter (Signed)
Spoke to patient and gave her information provided by the physician. Patient wants to try and get the gel shots.

## 2016-05-02 NOTE — Telephone Encounter (Signed)
I think she would have to try and fail a cortisone injection first.  If she doesn't want to do that and instead wants to try the gel shots, we can see if we can get them approved though usually they're denied if cortisone hasn't been tried recently.

## 2016-05-03 ENCOUNTER — Ambulatory Visit: Payer: BLUE CROSS/BLUE SHIELD | Admitting: Family Medicine

## 2016-05-21 ENCOUNTER — Ambulatory Visit: Payer: BLUE CROSS/BLUE SHIELD

## 2016-05-23 ENCOUNTER — Other Ambulatory Visit (INDEPENDENT_AMBULATORY_CARE_PROVIDER_SITE_OTHER): Payer: BLUE CROSS/BLUE SHIELD

## 2016-05-23 ENCOUNTER — Ambulatory Visit (INDEPENDENT_AMBULATORY_CARE_PROVIDER_SITE_OTHER): Payer: BLUE CROSS/BLUE SHIELD

## 2016-05-23 ENCOUNTER — Other Ambulatory Visit: Payer: Self-pay

## 2016-05-23 DIAGNOSIS — E538 Deficiency of other specified B group vitamins: Secondary | ICD-10-CM

## 2016-05-23 LAB — VITAMIN B12: Vitamin B-12: 348 pg/mL (ref 211–911)

## 2016-05-23 MED ORDER — CYANOCOBALAMIN 1000 MCG/ML IJ SOLN
1000.0000 ug | Freq: Once | INTRAMUSCULAR | Status: AC
  Administered 2016-05-23: 1000 ug via INTRAMUSCULAR

## 2016-05-23 NOTE — Progress Notes (Signed)
Pre visit review using our clinic review tool, if applicable. No additional management support is needed unless otherwise documented below in the visit note.  Pt in clinic today for Vitamin B12 injection.  It was noted that she has not had a Vitamin B12 drawn in over a year.  She was suppose to return to lab for repeat b12 draw 3 months after her last draw.  Pt was agreeable to going to the lab today before injection.  Lab ordered and appt scheduled.  Pt had lab drawn and then returned to nurse clinic for injection.  Injection given.  Pt tolerated injection well.  No signs of a reaction noted.    Next B12 injection:  06/25/16.    Pt also has not had a well visit with PCP this year.  CPE scheduled for 07/16/16.

## 2016-06-05 ENCOUNTER — Ambulatory Visit (INDEPENDENT_AMBULATORY_CARE_PROVIDER_SITE_OTHER): Payer: BLUE CROSS/BLUE SHIELD | Admitting: Family

## 2016-06-05 ENCOUNTER — Encounter: Payer: Self-pay | Admitting: Family

## 2016-06-05 VITALS — BP 129/70 | Temp 98.3°F | Resp 16 | Ht 63.0 in | Wt 165.8 lb

## 2016-06-05 DIAGNOSIS — R350 Frequency of micturition: Secondary | ICD-10-CM

## 2016-06-05 LAB — POCT URINALYSIS DIPSTICK
Bilirubin, UA: NEGATIVE
Blood, UA: NEGATIVE
GLUCOSE UA: NEGATIVE
Ketones, UA: NEGATIVE
LEUKOCYTES UA: NEGATIVE
Nitrite, UA: NEGATIVE
PROTEIN UA: NEGATIVE
SPEC GRAV UA: 1.015
UROBILINOGEN UA: NEGATIVE
pH, UA: 6

## 2016-06-05 LAB — BASIC METABOLIC PANEL
BUN: 6 mg/dL (ref 6–23)
CALCIUM: 9.1 mg/dL (ref 8.4–10.5)
CHLORIDE: 106 meq/L (ref 96–112)
CO2: 26 meq/L (ref 19–32)
Creatinine, Ser: 0.69 mg/dL (ref 0.40–1.20)
GFR: 91.07 mL/min (ref >60.00)
GLUCOSE: 97 mg/dL (ref 70–99)
Potassium: 3.6 mEq/L (ref 3.5–5.1)
SODIUM: 141 meq/L (ref 135–145)

## 2016-06-05 LAB — HEMOGLOBIN A1C: Hgb A1c MFr Bld: 5.4 % (ref 4.6–6.5)

## 2016-06-05 MED ORDER — NITROFURANTOIN MONOHYD MACRO 100 MG PO CAPS: 100.0000 mg | ORAL_CAPSULE | Freq: Two times a day (BID) | ORAL | 0 refills | Status: DC

## 2016-06-05 MED ORDER — FLUCONAZOLE 150 MG PO TABS: 150.0000 mg | ORAL_TABLET | Freq: Once | ORAL | 0 refills | Status: DC | PRN

## 2016-06-05 MED FILL — FLUCONAZOLE 150 MG TABLET: 150 | 1 days supply | Qty: 1 | Fill #0

## 2016-06-05 MED FILL — NITROFURANTOIN MONO-MCR 100: 100 | 7 days supply | Qty: 14 | Fill #0

## 2016-06-05 NOTE — Progress Notes (Signed)
Subjective:    Patient ID: Dominique Gonzalez, female    DOB: 1950-12-19, 65 y.o.   MRN: [id number]  HPI  [name] is a 65 yr old female who presents today with chief complaint of urinary frequency.  Urinary frequency began 4 days ago. Reports that symptoms are worse at night. No dysuria, hematuria or fever.  She reports mild low back pain across her lower back.    Review of Systems    see HPI   Past Medical History:  Diagnosis Date  . Aortic insufficiency    mild  . B12 deficiency   . Cardiac murmur   . Diastolic dysfunction   . Fatigue   . Hip pain, right   . HTN (hypertension)   . Hyperlipidemia   . Leg pain, right   . Osteopenia   . Shoulder pain, left   . Sinusitis, acute    NOS  . URI (upper respiratory infection)      Social History   Social History  . Marital status: Married    Spouse name: N/A  . Number of children: 2  . Years of education: N/A   Occupational History  . Not on file.   Social History Main Topics  . Smoking status: Former Smoker    Quit date: 01/10/1996  . Smokeless tobacco: Never Used     Comment: smoked for 10 years,   . Alcohol use No  . Drug use: No  . Sexual activity: Not on file   Other Topics Concern  . Not on file   Social History Narrative  . No narrative on file    Past Surgical History:  Procedure Laterality Date  . ABDOMINAL HYSTERECTOMY     age 51  . BILATERAL OOPHORECTOMY      Family History  Problem Relation Age of Onset  . Hypertension Father   . Diabetes Father   . Hypertension Mother   . Breast cancer Mother     Allergies  Allergen Reactions  . Bactrim [Sulfamethoxazole-Trimethoprim] Hives    Current Outpatient Prescriptions on File Prior to Visit  Medication Sig Dispense Refill  . aspirin 81 MG tablet Take 81 mg by mouth daily.      Marland Kitchen lisinopril-hydrochlorothiazide (PRINZIDE,ZESTORETIC) 10-12.5 MG tablet TAKE 1 TABLET BY MOUTH EVERY DAY 90 tablet 1   Current Facility-Administered  Medications on File Prior to Visit  Medication Dose Route Frequency Provider Last Rate Last Dose  . cyanocobalamin ((VITAMIN B-12)) injection 1,000 mcg  1,000 mcg Intramuscular Once Yvonne R Lowne Chase, DO        BP 129/70 (BP Location: Right Arm, Cuff Size: Normal)   Temp 98.3 F (36.8 C) (Oral)   Resp 16   Ht 5\' 3"  (1.6 m)   Wt 165 lb 12.8 oz (75.2 kg)   LMP  (LMP Unknown)   SpO2 99% Comment: room air  BMI 29.37 kg/m    Objective:   Physical Exam  Constitutional: She is oriented to person, place, and time. She appears well-developed and well-nourished.  HENT:  Head: Normocephalic and atraumatic.  Cardiovascular: Normal rate, regular rhythm and normal heart sounds.   No murmur heard. Pulmonary/Chest: Effort normal and breath sounds normal. No respiratory distress. She has no wheezes.  Abdominal: Soft. She exhibits no distension. There is no tenderness. There is no CVA tenderness.  Neurological: She is alert and oriented to person, place, and time.  Skin: Skin is warm and dry.  Psychiatric: She has a normal mood and affect. Her  behavior is normal. Judgment and thought content normal.          Assessment & Plan:  Urinary frequency- UA clean, will send for culture. Begin empiric macrodantin due to associated back pain- though this may be musculoskeletal. If culture negative, consider overactive bladder. Will also check BMET and A1C to assess glucose and rule out hyperglycemia as a potential cause.

## 2016-06-05 NOTE — Progress Notes (Signed)
Pre visit review using our clinic review tool, if applicable. No additional management support is needed unless otherwise documented below in the visit note. 

## 2016-06-05 NOTE — Patient Instructions (Signed)
Please begin Macrodantin for possible UTI. Complete lab work prior to leaving. Call if symptoms worsen or if symptoms are not improved in 3 days.

## 2016-06-06 LAB — URINE CULTURE

## 2016-06-07 ENCOUNTER — Encounter: Payer: Self-pay | Admitting: Family

## 2016-06-25 ENCOUNTER — Ambulatory Visit (INDEPENDENT_AMBULATORY_CARE_PROVIDER_SITE_OTHER): Payer: BLUE CROSS/BLUE SHIELD

## 2016-06-25 DIAGNOSIS — E538 Deficiency of other specified B group vitamins: Secondary | ICD-10-CM | POA: Diagnosis not present

## 2016-06-25 MED ORDER — CYANOCOBALAMIN 1000 MCG/ML IJ SOLN
1000.0000 ug | Freq: Once | INTRAMUSCULAR | Status: AC
  Administered 2016-06-25: 1000 ug via INTRAMUSCULAR

## 2016-06-25 NOTE — Progress Notes (Signed)
Pre visit review using our clinic tool,if applicable. No additional management support is needed unless otherwise documented below in the visit note.   Patient in for B12 injection. Given Right Deltoid IM. Patient tolerated well.

## 2016-07-16 ENCOUNTER — Encounter: Payer: BLUE CROSS/BLUE SHIELD | Admitting: Family Medicine

## 2016-07-23 ENCOUNTER — Ambulatory Visit (INDEPENDENT_AMBULATORY_CARE_PROVIDER_SITE_OTHER): Payer: BLUE CROSS/BLUE SHIELD

## 2016-07-23 DIAGNOSIS — E538 Deficiency of other specified B group vitamins: Secondary | ICD-10-CM | POA: Diagnosis not present

## 2016-07-23 MED ORDER — CYANOCOBALAMIN 1000 MCG/ML IJ SOLN
1000.0000 ug | Freq: Once | INTRAMUSCULAR | Status: AC
  Administered 2016-07-23: 1000 ug via INTRAMUSCULAR

## 2016-07-23 NOTE — Progress Notes (Signed)
Pre visit review using our clinic tool,if applicable. No additional management support is needed unless otherwise documented below in the visit note.   Patient in for B12 injection due to B12 deficiency per Dr. Carollee Herter

## 2016-08-21 ENCOUNTER — Encounter: Payer: Self-pay | Admitting: Family Medicine

## 2016-08-21 ENCOUNTER — Ambulatory Visit (INDEPENDENT_AMBULATORY_CARE_PROVIDER_SITE_OTHER): Payer: BLUE CROSS/BLUE SHIELD | Admitting: Family Medicine

## 2016-08-21 VITALS — BP 132/86 | HR 80 | Temp 97.9°F | Ht 64.0 in | Wt 166.2 lb

## 2016-08-21 DIAGNOSIS — J0100 Acute maxillary sinusitis, unspecified: Secondary | ICD-10-CM | POA: Diagnosis not present

## 2016-08-21 MED ORDER — AMOXICILLIN-POT CLAVULANATE 875-125 MG PO TABS: 1.0000 | ORAL_TABLET | Freq: Two times a day (BID) | ORAL | 0 refills | Status: DC

## 2016-08-21 MED FILL — AMOX-CLAV 875-125 MG TABLET: 875-125 | 10 days supply | Qty: 20 | Fill #0

## 2016-08-21 NOTE — Progress Notes (Signed)
Chief Complaint  Patient presents with  . Nasal Congestion    Pt reports having cold x 10 days and has had ear pain and drainage from the eyes with cough /Pt states she has been exposedto flu    Dominique Gonzalez is 65 y.o. female here for possible sinus infection.  Duration: 10 days, starting to worsen Symptoms include: nasal congestion, clear rhinorrhea, cough, sinus pain/pressure on L Denies: fevers, sore throats Treatment to date: Pseudophed and Claritin  Sick contacts? No  ROS:  Const: Denies fevers HEENT: As noted in HPI  Past Medical History:  Diagnosis Date  . Aortic insufficiency    mild  . B12 deficiency   . Cardiac murmur   . Diastolic dysfunction   . Fatigue   . Hip pain, right   . HTN (hypertension)   . Hyperlipidemia   . Leg pain, right   . Osteopenia   . Shoulder pain, left   . Sinusitis, acute    NOS  . URI (upper respiratory infection)    Social History   Social History  . Marital status: Married   Social History Main Topics  . Smoking status: Former Smoker    Quit date: 01/10/1996  . Smokeless tobacco: Never Used     Comment: smoked for 10 years,   . Alcohol use No  . Drug use: No   Family History  Problem Relation Age of Onset  . Hypertension Father   . Diabetes Father   . Hypertension Mother   . Breast cancer Mother     BP 132/86 (BP Location: Left Arm, Patient Position: Sitting, Cuff Size: Small)   Pulse 80   Temp 97.9 F (36.6 C) (Oral)   Ht 5\' 4"  (1.626 m)   Wt 166 lb 3.2 oz (75.4 kg)   LMP  (LMP Unknown)   SpO2 98%   BMI 28.53 kg/m  Gen- Awake, alert, appears stated age HEENT- Ears patent, TM's neg b/l, nares are patent, +TTP over L max sinus, otherwise sinuses are not TTP, MMM, pharynx is not erythematous or with exudate Heart- RRR, no murmur, no bruits Lungs- CTAB, no accessory muscle use Psych- Age appropriate judgment and insight, nml mood and affect  Acute non-recurrent maxillary sinusitis - Plan:  amoxicillin-clavulanate (AUGMENTIN) 875-125 MG tablet  Orders as above. Given duration and worsening, will tx. OTC remedies recommended in AVS. F/u prn. The patient voiced understanding and agreement to the plan.  [place], DO 08/21/16 4:52 PM

## 2016-08-21 NOTE — Progress Notes (Signed)
Pre visit review using our clinic review tool, if applicable. No additional management support is needed unless otherwise documented below in the visit note. 

## 2016-08-21 NOTE — Patient Instructions (Signed)
Claritin (loratadine), Allegra (fexofenadine), Zyrtec (cetirizine); these are listed in order from weakest to strongest. Generic, and therefore cheaper, options are in the parentheses.   Flonase (fluticasone); nasal spray that is over the counter. 2 sprays each nostril, once daily. Aim towards the same side eye when you spray.  There are available OTC, and the generic versions, which may be cheaper, are in parentheses. Show this to a pharmacist if you have trouble finding any of these items.  

## 2016-08-22 ENCOUNTER — Ambulatory Visit (INDEPENDENT_AMBULATORY_CARE_PROVIDER_SITE_OTHER): Payer: BLUE CROSS/BLUE SHIELD | Admitting: Behavioral Health

## 2016-08-22 ENCOUNTER — Telehealth: Payer: Self-pay | Admitting: Family Medicine

## 2016-08-22 DIAGNOSIS — E538 Deficiency of other specified B group vitamins: Secondary | ICD-10-CM | POA: Diagnosis not present

## 2016-08-22 MED ORDER — CYANOCOBALAMIN 1000 MCG/ML IJ SOLN
1000.0000 ug | Freq: Once | INTRAMUSCULAR | Status: AC
  Administered 2016-08-22: 1000 ug via INTRAMUSCULAR

## 2016-08-22 NOTE — Telephone Encounter (Signed)
Caller name: Relationship to patient: Self Can be reached: [phone]  Pharmacy:  [place], [place] - [address] [phone] (Phone) [phone] (Fax)    Reason for call: Patient states that the antibiotic that was given to her by Dr. Nani Ravens on yesterday is making her sick on the stomach so she is not going to take it anymore. Request a Rx for another antibiotic. States she did eat prior to taking it but she still has nausea.

## 2016-08-22 NOTE — Progress Notes (Signed)
Pre visit review using our clinic review tool, if applicable. No additional management support is needed unless otherwise documented below in the visit note.  Patient came in clinic today for B12 injection. IM given in Right Deltoid. Patient tolerated injection well.  Next appointment scheduled for 09/24/16 at 2:45 PM.

## 2016-08-23 ENCOUNTER — Other Ambulatory Visit: Payer: Self-pay | Admitting: Emergency Medicine

## 2016-08-23 MED ORDER — DOXYCYCLINE HYCLATE 100 MG PO TABS: 100.0000 mg | ORAL_TABLET | Freq: Two times a day (BID) | ORAL | 0 refills | Status: DC

## 2016-08-23 NOTE — Telephone Encounter (Signed)
Called pt to discuss provider recommendations. Pt verbalized understanding. Called in Doxycycline 100 mg twice daily for 7 days to pharmacy.

## 2016-08-23 NOTE — Telephone Encounter (Signed)
Stop taking Augmentin. Call in Doxycycline 100 mg twice daily for 7 days please. Take this with food also. TY.

## 2016-09-18 ENCOUNTER — Ambulatory Visit: Payer: BLUE CROSS/BLUE SHIELD

## 2016-09-24 ENCOUNTER — Ambulatory Visit: Payer: BLUE CROSS/BLUE SHIELD

## 2016-09-25 ENCOUNTER — Ambulatory Visit: Payer: BLUE CROSS/BLUE SHIELD

## 2016-10-02 ENCOUNTER — Ambulatory Visit: Payer: BLUE CROSS/BLUE SHIELD

## 2016-10-03 ENCOUNTER — Ambulatory Visit (INDEPENDENT_AMBULATORY_CARE_PROVIDER_SITE_OTHER): Payer: BLUE CROSS/BLUE SHIELD | Admitting: Behavioral Health

## 2016-10-03 DIAGNOSIS — E538 Deficiency of other specified B group vitamins: Secondary | ICD-10-CM

## 2016-10-03 MED ORDER — CYANOCOBALAMIN 1000 MCG/ML IJ SOLN
1000.0000 ug | Freq: Once | INTRAMUSCULAR | Status: AC
  Administered 2016-10-03: 1000 ug via INTRAMUSCULAR

## 2016-10-03 NOTE — Progress Notes (Signed)
Pre visit review using our clinic review tool, if applicable. No additional management support is needed unless otherwise documented below in the visit note.  Patient in clinic today for B12 injection. IM given in Right Deltoid. Patient tolerated injection well.  Next appointment 10/31/16 at 2:30 PM.

## 2016-10-09 ENCOUNTER — Other Ambulatory Visit: Payer: Self-pay | Admitting: Family Medicine

## 2016-10-31 ENCOUNTER — Ambulatory Visit (INDEPENDENT_AMBULATORY_CARE_PROVIDER_SITE_OTHER): Payer: BLUE CROSS/BLUE SHIELD | Admitting: Behavioral Health

## 2016-10-31 DIAGNOSIS — E538 Deficiency of other specified B group vitamins: Secondary | ICD-10-CM | POA: Diagnosis not present

## 2016-10-31 MED ORDER — CYANOCOBALAMIN 1000 MCG/ML IJ SOLN
1000.0000 ug | Freq: Once | INTRAMUSCULAR | Status: AC
  Administered 2016-10-31: 1000 ug via INTRAMUSCULAR

## 2016-10-31 NOTE — Progress Notes (Signed)
Pre visit review using our clinic review tool, if applicable. No additional management support is needed unless otherwise documented below in the visit note.     Pt. came in for b12 injection and tolerated it well. Next appointment 12/03/2016

## 2016-12-03 ENCOUNTER — Ambulatory Visit (INDEPENDENT_AMBULATORY_CARE_PROVIDER_SITE_OTHER): Payer: BLUE CROSS/BLUE SHIELD

## 2016-12-03 DIAGNOSIS — E538 Deficiency of other specified B group vitamins: Secondary | ICD-10-CM | POA: Diagnosis not present

## 2016-12-03 MED ORDER — CYANOCOBALAMIN 1000 MCG/ML IJ SOLN
1000.0000 ug | Freq: Once | INTRAMUSCULAR | Status: AC
  Administered 2016-12-03: 1000 ug via INTRAMUSCULAR

## 2016-12-19 LAB — HM MAMMOGRAPHY

## 2016-12-27 ENCOUNTER — Encounter: Payer: Self-pay | Admitting: Family Medicine

## 2017-01-02 ENCOUNTER — Ambulatory Visit: Payer: BLUE CROSS/BLUE SHIELD

## 2017-01-07 ENCOUNTER — Ambulatory Visit (INDEPENDENT_AMBULATORY_CARE_PROVIDER_SITE_OTHER): Payer: BLUE CROSS/BLUE SHIELD

## 2017-01-07 DIAGNOSIS — E538 Deficiency of other specified B group vitamins: Secondary | ICD-10-CM | POA: Diagnosis not present

## 2017-01-07 MED ORDER — CYANOCOBALAMIN 1000 MCG/ML IJ SOLN
1000.0000 ug | Freq: Once | INTRAMUSCULAR | Status: AC
  Administered 2017-01-07: 1000 ug via INTRAMUSCULAR

## 2017-01-07 NOTE — Progress Notes (Signed)
Patient in for B12 injection per order from Dr. Floy Sabina due to B12 deficiency.  Patient states she has been having problems with her knees but is being treated for them.  Given 1000 mcg B12 IM Right deltoid.  Patient tolerated well.   Patient scheduled next appointment at check out.

## 2017-02-11 ENCOUNTER — Ambulatory Visit: Payer: BLUE CROSS/BLUE SHIELD

## 2017-02-12 ENCOUNTER — Encounter: Payer: Self-pay | Admitting: Cardiovascular Disease

## 2017-02-18 ENCOUNTER — Other Ambulatory Visit: Payer: Self-pay

## 2017-02-18 ENCOUNTER — Ambulatory Visit (HOSPITAL_COMMUNITY): Payer: BLUE CROSS/BLUE SHIELD | Attending: Cardiology

## 2017-02-18 DIAGNOSIS — I059 Rheumatic mitral valve disease, unspecified: Secondary | ICD-10-CM | POA: Diagnosis present

## 2017-02-18 DIAGNOSIS — I340 Nonrheumatic mitral (valve) insufficiency: Secondary | ICD-10-CM

## 2017-02-18 DIAGNOSIS — E785 Hyperlipidemia, unspecified: Secondary | ICD-10-CM | POA: Diagnosis not present

## 2017-02-18 DIAGNOSIS — I351 Nonrheumatic aortic (valve) insufficiency: Secondary | ICD-10-CM

## 2017-02-18 DIAGNOSIS — I10 Essential (primary) hypertension: Secondary | ICD-10-CM | POA: Diagnosis not present

## 2017-02-18 DIAGNOSIS — I080 Rheumatic disorders of both mitral and aortic valves: Secondary | ICD-10-CM | POA: Insufficient documentation

## 2017-02-18 DIAGNOSIS — I359 Nonrheumatic aortic valve disorder, unspecified: Secondary | ICD-10-CM | POA: Diagnosis present

## 2017-02-19 ENCOUNTER — Ambulatory Visit (INDEPENDENT_AMBULATORY_CARE_PROVIDER_SITE_OTHER): Payer: BLUE CROSS/BLUE SHIELD | Admitting: Behavioral Health

## 2017-02-19 DIAGNOSIS — E538 Deficiency of other specified B group vitamins: Secondary | ICD-10-CM

## 2017-02-19 MED ORDER — CYANOCOBALAMIN 1000 MCG/ML IJ SOLN
1000.0000 ug | Freq: Once | INTRAMUSCULAR | Status: AC
  Administered 2017-02-19: 1000 ug via INTRAMUSCULAR

## 2017-02-19 NOTE — Progress Notes (Signed)
Pre visit review using our clinic review tool, if applicable. No additional management support is needed unless otherwise documented below in the visit note.  Patient came in clinic for monthly B12 injection. IM injection given in the left deltoid. Patient tolerated it well. Next appointment scheduled for 03/21/17 at 2:30 PM.

## 2017-03-03 ENCOUNTER — Encounter: Payer: Self-pay | Admitting: Cardiovascular Disease

## 2017-03-03 ENCOUNTER — Ambulatory Visit (INDEPENDENT_AMBULATORY_CARE_PROVIDER_SITE_OTHER): Payer: BLUE CROSS/BLUE SHIELD | Admitting: Cardiovascular Disease

## 2017-03-03 ENCOUNTER — Encounter (INDEPENDENT_AMBULATORY_CARE_PROVIDER_SITE_OTHER): Payer: Self-pay

## 2017-03-03 VITALS — BP 126/70 | HR 74 | Ht 64.0 in | Wt 165.1 lb

## 2017-03-03 DIAGNOSIS — I340 Nonrheumatic mitral (valve) insufficiency: Secondary | ICD-10-CM

## 2017-03-03 DIAGNOSIS — I351 Nonrheumatic aortic (valve) insufficiency: Secondary | ICD-10-CM

## 2017-03-03 NOTE — Progress Notes (Signed)
Chief Complaint  Patient presents with  . Follow-up    aortic valve insufficiency    History of Present Illness: 66 yo female with history of aortic valve insufficiency, mitral regurgitation, HTN here today for follow up. She has been followed in my office since 2011 when she was found to have mild aortic valve insufficiency. Echo in 2011 showed normal LV systolic function, diastolic dysfunction, mild AI, small effusion and possible epicardial mass/aortic root mass. I ordered a cardiac MRI which showed prominent epicardial fat pad with no evidence of aortic or pericardial tumor or mass. Most recent echo Echo June 2018 with normal LV function, mild AI, mild MR.   Sje is here today for follow up. The patient denies any chest pain, dyspnea, palpitations, lower extremity edema, orthopnea, PND, dizziness, near syncope or syncope. She feels great.    Primary Care Physician:  Carollee Herter, Alferd Apa, DO  Past Medical History:  Diagnosis Date  . Aortic insufficiency    mild  . B12 deficiency   . Cardiac murmur   . Diastolic dysfunction   . Fatigue   . Hip pain, right   . HTN (hypertension)   . Hyperlipidemia   . Leg pain, right   . Osteopenia   . Shoulder pain, left   . Sinusitis, acute    NOS  . URI (upper respiratory infection)     Past Surgical History:  Procedure Laterality Date  . ABDOMINAL HYSTERECTOMY     age 70  . BILATERAL OOPHORECTOMY      Current Outpatient Prescriptions  Medication Sig Dispense Refill  . amoxicillin-clavulanate (AUGMENTIN) 875-125 MG tablet Take 1 tablet by mouth 2 (two) times daily. 20 tablet 0  . aspirin 81 MG tablet Take 81 mg by mouth daily.      Marland Kitchen lisinopril-hydrochlorothiazide (PRINZIDE,ZESTORETIC) 10-12.5 MG tablet TAKE 1 TABLET BY MOUTH EVERY DAY 90 tablet 1   Current Facility-Administered Medications  Medication Dose Route Frequency Provider Last Rate Last Dose  . cyanocobalamin ((VITAMIN B-12)) injection 1,000 mcg  1,000 mcg  Intramuscular Once Carollee Herter, Kendrick Fries R, DO        Allergies  Allergen Reactions  . Bactrim [Sulfamethoxazole-Trimethoprim] Hives    Social History   Social History  . Marital status: Married    Spouse name: N/A  . Number of children: 2  . Years of education: N/A   Occupational History  . Not on file.   Social History Main Topics  . Smoking status: Former Smoker    Quit date: 01/10/1996  . Smokeless tobacco: Never Used     Comment: smoked for 10 years,   . Alcohol use No  . Drug use: No  . Sexual activity: Not on file   Other Topics Concern  . Not on file   Social History Narrative  . No narrative on file    Family History  Problem Relation Age of Onset  . Hypertension Father   . Diabetes Father   . Hypertension Mother   . Breast cancer Mother     Review of Systems:  As stated in the HPI and otherwise negative.   BP 126/70   Pulse 74   Ht 5\' 4"  (1.626 m)   Wt 165 lb 1.9 oz (74.9 kg)   LMP  (LMP Unknown)   SpO2 97%   BMI 28.34 kg/m   Physical Examination:  General: Well developed, well nourished, NAD  HEENT: OP clear, mucus membranes moist  SKIN: warm, dry. No rashes. Neuro: No  focal deficits  Musculoskeletal: Muscle strength 5/5 all ext  Psychiatric: Mood and affect normal  Neck: No JVD, no carotid bruits, no thyromegaly, no lymphadenopathy.  Lungs:Clear bilaterally, no wheezes, rhonci, crackles Cardiovascular: Regular rate and rhythm. No murmurs, gallops or rubs. Abdomen:Soft. Bowel sounds present. Non-tender.  Extremities: No lower extremity edema. Pulses are 2 + in the bilateral DP/PT.  Echo 02/18/17: Left ventricle: The cavity size was normal. Systolic function was normal. The estimated ejection fraction was in the range of 60% to 65%. Wall motion was normal; there were no regional wall motion abnormalities. Left ventricular diastolic function   parameters were normal. - Aortic valve: There was mild regurgitation. - Mitral valve: There was  mild regurgitation. - Atrial septum: No defect or patent foramen ovale was identified.  EKG:  EKG is  ordered today. The ekg ordered today demonstrates  NSR, rate 72 bpm.   Recent Labs: 06/05/2016: BUN 6; Creatinine, Ser 0.69; Potassium 3.6; Sodium 141   Lipid Panel    Component Value Date/Time   CHOL 187 08/30/2011 1041   TRIG 93.0 08/30/2011 1041   HDL 41.80 08/30/2011 1041   CHOLHDL 4 08/30/2011 1041   VLDL 18.6 08/30/2011 1041   LDLCALC 127 (H) 08/30/2011 1041   LDLDIRECT 149.3 07/28/2007 0000     Wt Readings from Last 3 Encounters:  03/03/17 165 lb 1.9 oz (74.9 kg)  08/21/16 166 lb 3.2 oz (75.4 kg)  06/05/16 165 lb 12.8 oz (75.2 kg)     Other studies Reviewed: Additional studies/ records that were reviewed today include: . Review of the above records demonstrates:    Assessment and Plan:   1. AORTIC VALVE INSUFFICIENCY: Mild by echo June 2018. Will repeat echo in June 2021.    2. Mitral valve regurgitation: Mild by echo June 2018  3. HTN: BP controlled.    Current medicines are reviewed at length with the patient today.  The patient does not have concerns regarding medicines.  The following changes have been made:  no change  Labs/ tests ordered today include:   Orders Placed This Encounter  Procedures  . EKG 12-Lead    Disposition:   FU with me in 12  months  Signed, Lauree Chandler, MD 03/03/2017 10:04 AM    [facility] [place], [place],   [address] Phone: [phone]; Fax: [phone]

## 2017-03-03 NOTE — Patient Instructions (Signed)
Medication Instructions:  Your physician recommends that you continue on your current medications as directed. Please refer to the Current Medication list given to you today.   Labwork: NONE ORDRED  Testing/Procedures: NONE ORDERED  Follow-Up: Your physician wants you to follow-up in: [place] will receive a reminder letter in the mail two months in advance. If you don't receive a letter, please call our office to schedule the follow-up appointment.   Any Other Special Instructions Will Be Listed Below (If Applicable).     If you need a refill on your cardiac medications before your next appointment, please call your pharmacy.

## 2017-03-11 ENCOUNTER — Ambulatory Visit: Payer: BLUE CROSS/BLUE SHIELD

## 2017-03-20 ENCOUNTER — Ambulatory Visit (INDEPENDENT_AMBULATORY_CARE_PROVIDER_SITE_OTHER): Payer: BLUE CROSS/BLUE SHIELD

## 2017-03-20 DIAGNOSIS — Z7901 Long term (current) use of anticoagulants: Secondary | ICD-10-CM

## 2017-03-20 DIAGNOSIS — E538 Deficiency of other specified B group vitamins: Secondary | ICD-10-CM

## 2017-03-20 MED ORDER — CYANOCOBALAMIN 1000 MCG/ML IJ SOLN
1000.0000 ug | Freq: Once | INTRAMUSCULAR | Status: AC
  Administered 2017-07-04: 1000 ug via INTRAMUSCULAR

## 2017-03-20 MED ORDER — CYANOCOBALAMIN 1000 MCG/ML IJ SOLN: 1000.0000 ug | Freq: Once | INTRAMUSCULAR | Status: DC

## 2017-03-20 NOTE — Progress Notes (Signed)
Pre visit review using our clinic tool,if applicable. No additional management support is needed unless otherwise documented below in the visit note.   Patient in for B12 injection per order from Dr. Roma Schanz due to B12 deficiency.  No complaints voiced by patient.  Given 1000 mg IM right deltoid. Patient tolerated well. Return appointment scheduled for 1 month.

## 2017-03-21 ENCOUNTER — Ambulatory Visit: Payer: BLUE CROSS/BLUE SHIELD

## 2017-04-07 ENCOUNTER — Other Ambulatory Visit: Payer: Self-pay | Admitting: Family Medicine

## 2017-04-18 ENCOUNTER — Ambulatory Visit (INDEPENDENT_AMBULATORY_CARE_PROVIDER_SITE_OTHER): Payer: BLUE CROSS/BLUE SHIELD | Admitting: Behavioral Health

## 2017-04-18 DIAGNOSIS — E538 Deficiency of other specified B group vitamins: Secondary | ICD-10-CM | POA: Diagnosis not present

## 2017-04-18 MED ORDER — CYANOCOBALAMIN 1000 MCG/ML IJ SOLN
1000.0000 ug | Freq: Once | INTRAMUSCULAR | Status: AC
  Administered 2017-04-18: 1000 ug via INTRAMUSCULAR

## 2017-04-18 NOTE — Progress Notes (Signed)
Pre visit review using our clinic review tool, if applicable. No additional management support is needed unless otherwise documented below in the visit note.  Patient came in clinic for monthly B12 injection. IM injection was given in the right deltoid. Patient tolerated it well. Next appointment 05/20/17 at 2:30 PM.

## 2017-04-23 ENCOUNTER — Ambulatory Visit (INDEPENDENT_AMBULATORY_CARE_PROVIDER_SITE_OTHER): Payer: BLUE CROSS/BLUE SHIELD | Admitting: Medical

## 2017-04-23 ENCOUNTER — Encounter: Payer: Self-pay | Admitting: Medical

## 2017-04-23 VITALS — BP 114/58 | HR 84 | Temp 98.2°F | Ht 60.0 in | Wt 165.0 lb

## 2017-04-23 DIAGNOSIS — R350 Frequency of micturition: Secondary | ICD-10-CM

## 2017-04-23 DIAGNOSIS — R300 Dysuria: Secondary | ICD-10-CM | POA: Diagnosis not present

## 2017-04-23 LAB — POCT URINALYSIS DIPSTICK
BILIRUBIN UA: NEGATIVE
Blood, UA: NEGATIVE
GLUCOSE UA: NEGATIVE
Ketones, UA: NEGATIVE
Leukocytes, UA: NEGATIVE
Nitrite, UA: NEGATIVE
Protein, UA: NEGATIVE
SPEC GRAV UA: 1.01 (ref 1.010–1.025)
Urobilinogen, UA: 0.2 E.U./dL
pH, UA: 6.5 (ref 5.0–8.0)

## 2017-04-23 MED ORDER — PHENAZOPYRIDINE HCL 200 MG PO TABS: 200.0000 mg | ORAL_TABLET | Freq: Three times a day (TID) | ORAL | 0 refills | Status: DC | PRN

## 2017-04-23 MED ORDER — AMOXICILLIN 500 MG PO TABS: 500.0000 mg | ORAL_TABLET | Freq: Two times a day (BID) | ORAL | 0 refills | Status: DC

## 2017-04-23 MED FILL — AMOXICILLIN 500 MG CAPSULE: 500 | 7 days supply | Qty: 14 | Fill #0

## 2017-04-23 NOTE — Progress Notes (Signed)
Subjective:    Patient ID: Dominique Gonzalez, female    DOB: 1951-01-23, 66 y.o.   MRN: [id number]  HPI  Pt in today reporting urinary symptoms since Friday.  Dysuria- yes Frequent urination-yes Hesitancy-no Suprapubic pressure-yes Fever-no chills-no Nausea-yes Vomiting-no CVA pain-rt side(then clarifies not really) History of UTI- states 2-3 a year. Gross hematuria-no  Pt request amoxicillin be used pending culture.  Review of Systems  Constitutional: Negative for chills, fatigue and fever.  Cardiovascular: Negative for chest pain and palpitations.  Gastrointestinal: Positive for nausea. Negative for abdominal pain, diarrhea and vomiting.  Genitourinary: Positive for dysuria and frequency. Negative for difficulty urinating, flank pain, hematuria, pelvic pain, urgency and vaginal pain.  Musculoskeletal: Negative for back pain.  Skin: Negative for rash.  Neurological: Negative for dizziness, syncope, weakness and headaches.  Hematological: Negative for adenopathy. Does not bruise/bleed easily.  Psychiatric/Behavioral: Negative for behavioral problems and decreased concentration.   Past Medical History:  Diagnosis Date  . Aortic insufficiency    mild  . B12 deficiency   . Cardiac murmur   . Diastolic dysfunction   . Fatigue   . Hip pain, right   . HTN (hypertension)   . Hyperlipidemia   . Leg pain, right   . Osteopenia   . Shoulder pain, left   . Sinusitis, acute    NOS  . URI (upper respiratory infection)      Social History   Social History  . Marital status: Married    Spouse name: N/A  . Number of children: 2  . Years of education: N/A   Occupational History  . Not on file.   Social History Main Topics  . Smoking status: Former Smoker    Quit date: 01/10/1996  . Smokeless tobacco: Never Used     Comment: smoked for 10 years,   . Alcohol use No  . Drug use: No  . Sexual activity: Not on file   Other Topics Concern  . Not on file   Social  History Narrative  . No narrative on file    Past Surgical History:  Procedure Laterality Date  . ABDOMINAL HYSTERECTOMY     age 51  . BILATERAL OOPHORECTOMY      Family History  Problem Relation Age of Onset  . Hypertension Father   . Diabetes Father   . Hypertension Mother   . Breast cancer Mother     Allergies  Allergen Reactions  . Bactrim [Sulfamethoxazole-Trimethoprim] Hives    Current Outpatient Prescriptions on File Prior to Visit  Medication Sig Dispense Refill  . aspirin 81 MG tablet Take 81 mg by mouth daily.      Marland Kitchen lisinopril-hydrochlorothiazide (PRINZIDE,ZESTORETIC) 10-12.5 MG tablet Take 1 tablet by mouth daily. NEEDS OV BEFORE ANY MORE REFILLS 90 tablet 0   Current Facility-Administered Medications on File Prior to Visit  Medication Dose Route Frequency Provider Last Rate Last Dose  . cyanocobalamin ((VITAMIN B-12)) injection 1,000 mcg  1,000 mcg Intramuscular Once [employer], Yvonne R, DO        BP (!) 114/58 (BP Location: Right Arm, Patient Position: Sitting, Cuff Size: Normal)   Pulse 84   Temp 98.2 F (36.8 C) (Oral)   Ht 5' (1.524 m)   Wt 165 lb (74.8 kg)   LMP  (LMP Unknown)   SpO2 96%   BMI 32.22 kg/m       Objective:   Physical Exam  General Appearance- Not in acute distress.  HEENT Eyes- Scleraeral/Conjuntiva-bilat- Not Yellow. Mouth &  Throat- Normal.  Chest and Lung Exam Auscultation: Breath sounds:-Normal. Adventitious sounds:- No Adventitious sounds.  Cardiovascular Auscultation:Rythm - Regular. Heart Sounds -Normal heart sounds.  Abdomen Inspection:-Inspection Normal.  Palpation/Perucssion: Palpation and Percussion of the abdomen reveal- Non Tender/No suprapubic tenderness presently, No Rebound tenderness, No rigidity(Guarding) and No Palpable abdominal masses.  Liver:-Normal.  Spleen:- Normal.   Back- faint rt cva tenderness..      Assessment & Plan:  You appear to have a urinary tract infection. I am prescribing  amoxicillin  antibiotic for the probable infection. Hydrate well. I am sending out a urine culture. During the interim if your signs and symptoms worsen rather than improving please notify us. We will notify your when the culture results are back.  Rx pyridium for pain.  Follow up in 7 days or as needed.  Demarkus Remmel, Percell Miller, PA-C

## 2017-04-23 NOTE — Patient Instructions (Addendum)
You appear to have a urinary tract infection. I am prescribing amoxicillin  antibiotic for the probable infection. Hydrate well. I am sending out a urine culture. During the interim if your signs and symptoms worsen rather than improving please notify us. We will notify your when the culture results are back.  Rx pyridium for pain.  Follow up in 7 days or as needed.

## 2017-04-24 ENCOUNTER — Telehealth: Payer: Self-pay | Admitting: Family Medicine

## 2017-04-24 LAB — URINE CULTURE

## 2017-04-24 MED ORDER — NITROFURANTOIN MONOHYD MACRO 100 MG PO CAPS: 100.0000 mg | ORAL_CAPSULE | Freq: Two times a day (BID) | ORAL | 0 refills | Status: DC

## 2017-04-24 NOTE — Telephone Encounter (Signed)
I can call in something else but help me out with your acronym.  Imom tcb X1?  Not sure what that means.

## 2017-04-24 NOTE — Telephone Encounter (Signed)
Spoke with pt and informed her of Edward's message. Pt understood and had no additional questions at this time. She states her pharmacy has called her already to inform her that her rx was ready. Nothing further is needed

## 2017-04-24 NOTE — Telephone Encounter (Signed)
Relation to YK:DXIP Call back number:[phone] Pharmacy: [id number] [address], [place] (Phone) [phone] (Fax)     Reason for call:  Patient states amoxicillin (AMOXIL) 500 MG tablet makes her [place] requesting alternate Rx, please advise

## 2017-04-24 NOTE — Telephone Encounter (Signed)
Dominique Gonzalez you saw this pt yesterday and prescribed amoxicillin and pt called and stated that this makes her nauseous and is requesting an alternative.    Fyi: lmom tcb x1 to pt to assure she was not having any other symptoms

## 2017-04-24 NOTE — Telephone Encounter (Addendum)
Her culture grew out 10,000 bacteria which is very few. The bacteria may be normal type bacteria in the vaginal region or may represent very early infection. If she can't tolerate amoxicillin then stop I could send in [place].  I do have concern that she just might be sensitive to antibiotics as plain amoxicillin is usually well tolerated.  If by Tuesday she still has any urinary symptoms would recommend re-collecting urine and including ancillary studies.

## 2017-04-24 NOTE — Telephone Encounter (Signed)
I apologize " Left message on machine to call back"

## 2017-05-20 ENCOUNTER — Ambulatory Visit: Payer: BLUE CROSS/BLUE SHIELD

## 2017-07-04 ENCOUNTER — Other Ambulatory Visit: Payer: Self-pay | Admitting: Family Medicine

## 2017-07-04 ENCOUNTER — Ambulatory Visit (INDEPENDENT_AMBULATORY_CARE_PROVIDER_SITE_OTHER): Payer: PPO

## 2017-07-04 DIAGNOSIS — E538 Deficiency of other specified B group vitamins: Secondary | ICD-10-CM | POA: Diagnosis not present

## 2017-07-04 NOTE — Progress Notes (Signed)
Pre visit review using our clinic tool,if applicable. No additional management support is needed unless otherwise documented below in the visit note.   Patient in for B12 injection per order from Dr. Etter Sjogren- Cheri Rous due to patient having B12 deficiency.  Given 1000 mcg IM right deltoid. Patient tolerated well.   Appointment scheduled for next visit.

## 2017-08-01 ENCOUNTER — Ambulatory Visit: Payer: PPO

## 2017-08-07 ENCOUNTER — Ambulatory Visit: Payer: PPO

## 2017-08-12 DIAGNOSIS — M25561 Pain in right knee: Secondary | ICD-10-CM | POA: Diagnosis not present

## 2017-08-12 DIAGNOSIS — M1711 Unilateral primary osteoarthritis, right knee: Secondary | ICD-10-CM | POA: Diagnosis not present

## 2017-08-13 ENCOUNTER — Ambulatory Visit (INDEPENDENT_AMBULATORY_CARE_PROVIDER_SITE_OTHER): Payer: PPO

## 2017-08-13 DIAGNOSIS — E538 Deficiency of other specified B group vitamins: Secondary | ICD-10-CM | POA: Diagnosis not present

## 2017-08-13 MED ORDER — CYANOCOBALAMIN 1000 MCG/ML IJ SOLN
1000.0000 ug | Freq: Once | INTRAMUSCULAR | Status: AC
  Administered 2017-08-13: 1000 ug via INTRAMUSCULAR

## 2017-08-13 NOTE — Progress Notes (Signed)
Pre visit review using our clinic tool,if applicable. No additional management support is needed unless otherwise documented below in the visit note.   Patient in from B12 injection per order from Dr. Carollee Herter due to B12 deficiency.  No complaints voiced this visit.  Given 1000 mcg IM left deltoid. Patient tolerated well.  Return appointment scheduled.

## 2017-08-14 NOTE — Progress Notes (Signed)
Reviewed  Geri Hepler R Lowne Chase, DO  

## 2017-08-28 ENCOUNTER — Ambulatory Visit: Payer: PPO | Admitting: Family Medicine

## 2017-09-02 ENCOUNTER — Ambulatory Visit: Payer: PPO | Admitting: Family Medicine

## 2017-09-09 ENCOUNTER — Encounter: Payer: Self-pay | Admitting: Family Medicine

## 2017-09-09 ENCOUNTER — Ambulatory Visit (INDEPENDENT_AMBULATORY_CARE_PROVIDER_SITE_OTHER): Payer: PPO | Admitting: Family Medicine

## 2017-09-09 DIAGNOSIS — I10 Essential (primary) hypertension: Secondary | ICD-10-CM | POA: Diagnosis not present

## 2017-09-09 LAB — COMPREHENSIVE METABOLIC PANEL
ALBUMIN: 4.1 g/dL (ref 3.5–5.2)
ALK PHOS: 91 U/L (ref 39–117)
ALT: 14 U/L (ref 0–35)
AST: 17 U/L (ref 0–37)
BUN: 7 mg/dL (ref 6–23)
CALCIUM: 8.9 mg/dL (ref 8.4–10.5)
CHLORIDE: 103 meq/L (ref 96–112)
CO2: 28 mEq/L (ref 19–32)
Creatinine, Ser: 0.67 mg/dL (ref 0.40–1.20)
GFR: 93.84 mL/min (ref >60.00)
Glucose, Bld: 100 mg/dL — ABNORMAL HIGH (ref 70–99)
POTASSIUM: 3.4 meq/L — AB (ref 3.5–5.1)
SODIUM: 139 meq/L (ref 135–145)
TOTAL PROTEIN: 7.4 g/dL (ref 6.0–8.3)
Total Bilirubin: 0.6 mg/dL (ref 0.2–1.2)

## 2017-09-09 LAB — LIPID PANEL
CHOL/HDL RATIO: 4
Cholesterol: 160 mg/dL (ref 0–200)
HDL: 39.3 mg/dL (ref >39.00)
LDL Cholesterol: 97 mg/dL (ref 0–99)
NonHDL: 120.28
Triglycerides: 115 mg/dL (ref 0.0–149.0)
VLDL: 23 mg/dL (ref 0.0–40.0)

## 2017-09-09 MED ORDER — LISINOPRIL-HYDROCHLOROTHIAZIDE 10-12.5 MG PO TABS: 1.0000 | ORAL_TABLET | Freq: Every day | ORAL | 1 refills | Status: DC

## 2017-09-09 NOTE — Patient Instructions (Signed)
Managing Your Hypertension Hypertension is commonly called high blood pressure. This is when the force of your blood pressing against the walls of your arteries is too strong. Arteries are blood vessels that carry blood from your heart throughout your body. Hypertension forces the heart to work harder to pump blood, and may cause the arteries to become narrow or stiff. Having untreated or uncontrolled hypertension can cause heart attack, stroke, kidney disease, and other problems. What are blood pressure readings? A blood pressure reading consists of a higher number over a lower number. Ideally, your blood pressure should be below 120/80. The first ("top") number is called the systolic pressure. It is a measure of the pressure in your arteries as your heart beats. The second ("bottom") number is called the diastolic pressure. It is a measure of the pressure in your arteries as the heart relaxes. What does my blood pressure reading mean? Blood pressure is classified into four stages. Based on your blood pressure reading, your health care provider may use the following stages to determine what type of treatment you need, if any. Systolic pressure and diastolic pressure are measured in a unit called mm Hg. Normal  Systolic pressure: below 120.  Diastolic pressure: below 80. Elevated  Systolic pressure: 120-129.  Diastolic pressure: below 80. Hypertension stage 1  Systolic pressure: 130-139.  Diastolic pressure: 80-89. Hypertension stage 2  Systolic pressure: 140 or above.  Diastolic pressure: 90 or above. What health risks are associated with hypertension? Managing your hypertension is an important responsibility. Uncontrolled hypertension can lead to:  A heart attack.  A stroke.  A weakened blood vessel (aneurysm).  Heart failure.  Kidney damage.  Eye damage.  Metabolic syndrome.  Memory and concentration problems.  What changes can I make to manage my  hypertension? Hypertension can be managed by making lifestyle changes and possibly by taking medicines. Your health care provider will help you make a plan to bring your blood pressure within a normal range. Eating and drinking  Eat a diet that is high in fiber and potassium, and low in salt (sodium), added sugar, and fat. An example eating plan is called the DASH (Dietary Approaches to Stop Hypertension) diet. To eat this way: ? Eat plenty of fresh fruits and vegetables. Try to fill half of your plate at each meal with fruits and vegetables. ? Eat whole grains, such as whole wheat pasta, brown rice, or whole grain bread. Fill about one quarter of your plate with whole grains. ? Eat low-fat diary products. ? Avoid fatty cuts of meat, processed or cured meats, and poultry with skin. Fill about one quarter of your plate with lean proteins such as fish, chicken without skin, beans, eggs, and tofu. ? Avoid premade and processed foods. These tend to be higher in sodium, added sugar, and fat.  Reduce your daily sodium intake. Most people with hypertension should eat less than 1,500 mg of sodium a day.  Limit alcohol intake to no more than 1 drink a day for nonpregnant women and 2 drinks a day for men. One drink equals 12 oz of beer, 5 oz of wine, or 1 oz of hard liquor. Lifestyle  Work with your health care provider to maintain a healthy body weight, or to lose weight. Ask what an ideal weight is for you.  Get at least 30 minutes of exercise that causes your heart to beat faster (aerobic exercise) most days of the week. Activities may include walking, swimming, or biking.  Include exercise   to strengthen your muscles (resistance exercise), such as weight lifting, as part of your weekly exercise routine. Try to do these types of exercises for 30 minutes at least 3 days a week.  Do not use any products that contain nicotine or tobacco, such as cigarettes and e-cigarettes. If you need help quitting, ask  your health care provider.  Control any long-term (chronic) conditions you have, such as high cholesterol or diabetes. Monitoring  Monitor your blood pressure at home as told by your health care provider. Your personal target blood pressure may vary depending on your medical conditions, your age, and other factors.  Have your blood pressure checked regularly, as often as told by your health care provider. Working with your health care provider  Review all the medicines you take with your health care provider because there may be side effects or interactions.  Talk with your health care provider about your diet, exercise habits, and other lifestyle factors that may be contributing to hypertension.  Visit your health care provider regularly. Your health care provider can help you create and adjust your plan for managing hypertension. Will I need medicine to control my blood pressure? Your health care provider may prescribe medicine if lifestyle changes are not enough to get your blood pressure under control, and if:  Your systolic blood pressure is 130 or higher.  Your diastolic blood pressure is 80 or higher.  Take medicines only as told by your health care provider. Follow the directions carefully. Blood pressure medicines must be taken as prescribed. The medicine does not work as well when you skip doses. Skipping doses also puts you at risk for problems. Contact a health care provider if:  You think you are having a reaction to medicines you have taken.  You have repeated (recurrent) headaches.  You feel dizzy.  You have swelling in your ankles.  You have trouble with your vision. Get help right away if:  You develop a severe headache or confusion.  You have unusual weakness or numbness, or you feel faint.  You have severe pain in your chest or abdomen.  You vomit repeatedly.  You have trouble breathing. Summary  Hypertension is when the force of blood pumping through  your arteries is too strong. If this condition is not controlled, it may put you at risk for serious complications.  Your personal target blood pressure may vary depending on your medical conditions, your age, and other factors. For most people, a normal blood pressure is less than 120/80.  Hypertension is managed by lifestyle changes, medicines, or both. Lifestyle changes include weight loss, eating a healthy, low-sodium diet, exercising more, and limiting alcohol. This information is not intended to replace advice given to you by your health care provider. Make sure you discuss any questions you have with your health care provider. Document Released: 05/06/2012 Document Revised: 07/10/2016 Document Reviewed: 07/10/2016 Elsevier Interactive Patient Education  2018 [employer]  

## 2017-09-09 NOTE — Progress Notes (Signed)
Subjective:  I acted as a Education administrator for [employer], [place]   Patient ID: Dominique Gonzalez, female    DOB: 1950/09/26, 67 y.o.   MRN: [id number]  Chief Complaint  Patient presents with  . Hypertension    Pt states BP has been well monitored and controlled   . Medication Refill    HPI  Patient is in today for BP check and Rx refills   No complaints.    Patient Care Team: Carollee Herter, Alferd Apa, DO as PCP - General   Past Medical History:  Diagnosis Date  . Aortic insufficiency    mild  . B12 deficiency   . Cardiac murmur   . Diastolic dysfunction   . Fatigue   . Hip pain, right   . HTN (hypertension)   . Hyperlipidemia   . Leg pain, right   . Osteopenia   . Shoulder pain, left   . Sinusitis, acute    NOS  . URI (upper respiratory infection)     Past Surgical History:  Procedure Laterality Date  . ABDOMINAL HYSTERECTOMY     age 43  . BILATERAL OOPHORECTOMY      Family History  Problem Relation Age of Onset  . Hypertension Father   . Diabetes Father   . Hypertension Mother   . Breast cancer Mother     Social History   Socioeconomic History  . Marital status: Married    Spouse name: Not on file  . Number of children: 2  . Years of education: Not on file  . Highest education level: Not on file  Social Needs  . Financial resource strain: Not on file  . Food insecurity - worry: Not on file  . Food insecurity - inability: Not on file  . Transportation needs - medical: Not on file  . Transportation needs - non-medical: Not on file  Occupational History  . Not on file  Tobacco Use  . Smoking status: Former Smoker    Last attempt to quit: 01/10/1996    Years since quitting: 21.6  . Smokeless tobacco: Never Used  . Tobacco comment: smoked for 10 years,   Substance and Sexual Activity  . Alcohol use: No  . Drug use: No  . Sexual activity: Not on file  Other Topics Concern  . Not on file  Social History Narrative  . Not on file    Outpatient  Medications Prior to Visit  Medication Sig Dispense Refill  . aspirin 81 MG tablet Take 81 mg by mouth daily.      . nitrofurantoin, macrocrystal-monohydrate, (MACROBID) 100 MG capsule Take 1 capsule (100 mg total) by mouth 2 (two) times daily. 14 capsule 0  . phenazopyridine (PYRIDIUM) 200 MG tablet Take 1 tablet (200 mg total) by mouth 3 (three) times daily as needed for pain. 10 tablet 0  . lisinopril-hydrochlorothiazide (PRINZIDE,ZESTORETIC) 10-12.5 MG tablet TAKE 1 TABLET BY MOUTH DAILY. NEEDS OV BEFORE ANY MORE REFILLS 90 tablet 0   No facility-administered medications prior to visit.     Allergies  Allergen Reactions  . Bactrim [Sulfamethoxazole-Trimethoprim] Hives    Review of Systems  Constitutional: Negative for chills, fever and malaise/fatigue.  HENT: Negative for congestion and hearing loss.   Eyes: Negative for discharge.  Respiratory: Negative for cough, sputum production and shortness of breath.   Cardiovascular: Negative for chest pain, palpitations and leg swelling.  Gastrointestinal: Negative for abdominal pain, blood in stool, constipation, diarrhea, heartburn, nausea and vomiting.  Genitourinary: Negative for  dysuria, frequency, hematuria and urgency.  Musculoskeletal: Negative for back pain, falls and myalgias.  Skin: Negative for rash.  Neurological: Negative for dizziness, sensory change, loss of consciousness, weakness and headaches.  Endo/Heme/Allergies: Negative for environmental allergies. Does not bruise/bleed easily.  Psychiatric/Behavioral: Negative for depression and suicidal ideas. The patient is not nervous/anxious and does not have insomnia.        Objective:    Physical Exam  Constitutional: She is oriented to person, place, and time. She appears well-developed and well-nourished.  HENT:  Head: Normocephalic and atraumatic.  Eyes: Conjunctivae and EOM are normal.  Neck: Normal range of motion. Neck supple. No JVD present. Carotid bruit is not  present. No thyromegaly present.  Cardiovascular: Normal rate, regular rhythm and normal heart sounds.  No murmur heard. Pulmonary/Chest: Effort normal and breath sounds normal. No respiratory distress. She has no wheezes. She has no rales. She exhibits no tenderness.  Musculoskeletal: She exhibits no edema.  Neurological: She is alert and oriented to person, place, and time.  Psychiatric: She has a normal mood and affect.  Nursing note and vitals reviewed.   BP 106/68   Pulse 86   Temp 98.3 F (36.8 C) (Oral)   Ht 5\' 3"  (1.6 m)   Wt 164 lb (74.4 kg)   LMP  (LMP Unknown)   SpO2 97%   BMI 29.05 kg/m  Wt Readings from Last 3 Encounters:  09/09/17 164 lb (74.4 kg)  04/23/17 165 lb (74.8 kg)  03/03/17 165 lb 1.9 oz (74.9 kg)   BP Readings from Last 3 Encounters:  09/09/17 106/68  04/23/17 (!) 114/58  03/03/17 126/70      There is no immunization history on file for this patient.  Health Maintenance  Topic Date Due  . Hepatitis C Screening  1951/03/26  . HIV Screening  07/19/1967  . TETANUS/TDAP  07/19/1971  . COLONOSCOPY  07/18/2002  . DEXA SCAN  07/18/2017  . PNA vac Low Risk Adult (1 of 2 - PCV13) 07/18/2017  . INFLUENZA VACCINE  04/27/2018 (Originally 03/26/2017)  . MAMMOGRAM  12/20/2018    Lab Results  Component Value Date   WBC 7.2 05/02/2015   HGB 13.6 05/02/2015   HCT 40.3 05/02/2015   PLT 276.0 05/02/2015   GLUCOSE 97 06/05/2016   CHOL 187 08/30/2011   TRIG 93.0 08/30/2011   HDL 41.80 08/30/2011   LDLDIRECT 149.3 07/28/2007   LDLCALC 127 (H) 08/30/2011   ALT 18 05/03/2014   AST 25 05/03/2014   NA 141 06/05/2016   K 3.6 06/05/2016   CL 106 06/05/2016   CREATININE 0.69 06/05/2016   BUN 6 06/05/2016   CO2 26 06/05/2016   TSH 1.37 05/02/2015   HGBA1C 5.4 06/05/2016    Lab Results  Component Value Date   TSH 1.37 05/02/2015   Lab Results  Component Value Date   WBC 7.2 05/02/2015   HGB 13.6 05/02/2015   HCT 40.3 05/02/2015   MCV 90.4  05/02/2015   PLT 276.0 05/02/2015   Lab Results  Component Value Date   NA 141 06/05/2016   K 3.6 06/05/2016   CO2 26 06/05/2016   GLUCOSE 97 06/05/2016   BUN 6 06/05/2016   CREATININE 0.69 06/05/2016   BILITOT 0.4 05/03/2014   ALKPHOS 93 05/03/2014   AST 25 05/03/2014   ALT 18 05/03/2014   PROT 6.8 05/03/2014   ALBUMIN 3.6 05/03/2014   CALCIUM 9.1 06/05/2016   GFR 91.07 06/05/2016   Lab Results  Component  Value Date   CHOL 187 08/30/2011   Lab Results  Component Value Date   HDL 41.80 08/30/2011   Lab Results  Component Value Date   LDLCALC 127 (H) 08/30/2011   Lab Results  Component Value Date   TRIG 93.0 08/30/2011   Lab Results  Component Value Date   CHOLHDL 4 08/30/2011   Lab Results  Component Value Date   HGBA1C 5.4 06/05/2016         Assessment & Plan:   Problem List Items Addressed This Visit      Unprioritized   Essential hypertension    Well controlled, no changes to meds. Encouraged heart healthy diet such as the DASH diet and exercise as tolerated.       Relevant Medications   lisinopril-hydrochlorothiazide (PRINZIDE,ZESTORETIC) 10-12.5 MG tablet   Other Relevant Orders   Lipid panel   Comprehensive metabolic panel      I have changed Dominique Gonzalez's lisinopril-hydrochlorothiazide. I am also having her maintain her aspirin, phenazopyridine, and nitrofurantoin (macrocrystal-monohydrate).  Meds ordered this encounter  Medications  . lisinopril-hydrochlorothiazide (PRINZIDE,ZESTORETIC) 10-12.5 MG tablet    Sig: Take 1 tablet by mouth daily.    Dispense:  90 tablet    Refill:  1    CMA served as Education administrator during this visit. History, Physical and Plan performed by medical provider. Documentation and orders reviewed and attested to.  Ann Held, DO

## 2017-09-09 NOTE — Assessment & Plan Note (Signed)
Well controlled, no changes to meds. Encouraged heart healthy diet such as the DASH diet and exercise as tolerated.  °

## 2017-09-10 ENCOUNTER — Encounter: Payer: Self-pay | Admitting: *Deleted

## 2017-09-12 ENCOUNTER — Ambulatory Visit: Payer: PPO | Admitting: *Deleted

## 2017-09-16 ENCOUNTER — Ambulatory Visit (INDEPENDENT_AMBULATORY_CARE_PROVIDER_SITE_OTHER): Payer: PPO

## 2017-09-16 DIAGNOSIS — E538 Deficiency of other specified B group vitamins: Secondary | ICD-10-CM

## 2017-09-16 MED ORDER — CYANOCOBALAMIN 1000 MCG/ML IJ SOLN
1000.0000 ug | Freq: Once | INTRAMUSCULAR | Status: AC
  Administered 2017-09-16: 1000 ug via INTRAMUSCULAR

## 2017-09-16 NOTE — Progress Notes (Signed)
Pre visit review using our clinic tool,if applicable. No additional management support is needed unless otherwise documented below in the visit note.   Patient in for B12 injection per order from Dr Carollee Herter due to patient having B12 deficiency.  No complaints voiced this visit.   Given 1000 mcg IM Right deltoid. Patient tolerated well.  Appointment scheduled for 1 month for next injection.

## 2017-09-16 NOTE — Progress Notes (Signed)
Reviewed  Yvonne R Lowne Chase, DO  

## 2017-10-15 ENCOUNTER — Encounter: Payer: Self-pay | Admitting: Medical

## 2017-10-15 ENCOUNTER — Ambulatory Visit (INDEPENDENT_AMBULATORY_CARE_PROVIDER_SITE_OTHER): Payer: PPO | Admitting: Medical

## 2017-10-15 VITALS — BP 116/69 | HR 97 | Temp 98.0°F | Resp 16 | Ht 63.0 in | Wt 165.6 lb

## 2017-10-15 DIAGNOSIS — J029 Acute pharyngitis, unspecified: Secondary | ICD-10-CM | POA: Diagnosis not present

## 2017-10-15 DIAGNOSIS — R05 Cough: Secondary | ICD-10-CM | POA: Diagnosis not present

## 2017-10-15 DIAGNOSIS — J0100 Acute maxillary sinusitis, unspecified: Secondary | ICD-10-CM

## 2017-10-15 DIAGNOSIS — R059 Cough, unspecified: Secondary | ICD-10-CM

## 2017-10-15 DIAGNOSIS — H6690 Otitis media, unspecified, unspecified ear: Secondary | ICD-10-CM

## 2017-10-15 LAB — POCT RAPID STREP A (OFFICE): Rapid Strep A Screen: NEGATIVE

## 2017-10-15 MED ORDER — FLUTICASONE PROPIONATE 50 MCG/ACT NA SUSP: 2.0000 | Freq: Every day | NASAL | 1 refills | Status: DC

## 2017-10-15 MED ORDER — AMOXICILLIN-POT CLAVULANATE 875-125 MG PO TABS: 1.0000 | ORAL_TABLET | Freq: Two times a day (BID) | ORAL | 0 refills | Status: DC

## 2017-10-15 MED ORDER — HYDROCODONE-HOMATROPINE 5-1.5 MG/5ML PO SYRP: 5.0000 mL | ORAL_SOLUTION | Freq: Three times a day (TID) | ORAL | 0 refills | Status: DC | PRN

## 2017-10-15 MED ORDER — FLUCONAZOLE 150 MG PO TABS: 150.0000 mg | ORAL_TABLET | Freq: Once | ORAL | 0 refills | Status: AC

## 2017-10-15 MED FILL — FLUCONAZOLE 150 MG TABLET: 150 | 1 days supply | Qty: 1 | Fill #0

## 2017-10-15 MED FILL — AMOX-CLAV 875-125 MG TABLET: 875-125 | 10 days supply | Qty: 20 | Fill #0

## 2017-10-15 MED FILL — HYDROMET SYRUP: 5-1.5 | 7 days supply | Qty: 100 | Fill #0

## 2017-10-15 MED FILL — FLUTICASONE PROP 50 MCG SPR: 50 | 30 days supply | Qty: 16 | Fill #0

## 2017-10-15 NOTE — Progress Notes (Signed)
Subjective:    Patient ID: Dominique Gonzalez, female    DOB: 1951/01/05, 67 y.o.   MRN: [id number]  HPI   Pt in sick since last Wednesday.   States at first felt very aches and got cough. Severe achiness started on Wednesday all over. Still achy. Fatigued with some ha. Occasional cough. Not very productive cough but feels like has mucus to bring up.   Now having sinus pain, ear pain and her throat is hurting.  No wheezing.  Pt did not get the flu vaccine this year.   Review of Systems  Constitutional: Positive for fatigue. Negative for chills and fever.  HENT: Positive for sinus pressure, sinus pain and sore throat.   Respiratory: Positive for cough. Negative for shortness of breath and wheezing.        Cough keeping her up at night  Cardiovascular: Negative for chest pain.  Musculoskeletal: Positive for myalgias. Negative for back pain, neck pain and neck stiffness.  Hematological: Negative for adenopathy. Does not bruise/bleed easily.  Psychiatric/Behavioral: Negative for behavioral problems and confusion.   Past Medical History:  Diagnosis Date  . Aortic insufficiency    mild  . B12 deficiency   . Cardiac murmur   . Diastolic dysfunction   . Fatigue   . Hip pain, right   . HTN (hypertension)   . Hyperlipidemia   . Leg pain, right   . Osteopenia   . Shoulder pain, left   . Sinusitis, acute    NOS  . URI (upper respiratory infection)      Social History   Socioeconomic History  . Marital status: Married    Spouse name: Not on file  . Number of children: 2  . Years of education: Not on file  . Highest education level: Not on file  Social Needs  . Financial resource strain: Not on file  . Food insecurity - worry: Not on file  . Food insecurity - inability: Not on file  . Transportation needs - medical: Not on file  . Transportation needs - non-medical: Not on file  Occupational History  . Not on file  Tobacco Use  . Smoking status: Former Smoker   Last attempt to quit: 01/10/1996    Years since quitting: 21.7  . Smokeless tobacco: Never Used  . Tobacco comment: smoked for 10 years,   Substance and Sexual Activity  . Alcohol use: No  . Drug use: No  . Sexual activity: Not on file  Other Topics Concern  . Not on file  Social History Narrative  . Not on file    Past Surgical History:  Procedure Laterality Date  . ABDOMINAL HYSTERECTOMY     age 2  . BILATERAL OOPHORECTOMY      Family History  Problem Relation Age of Onset  . Hypertension Father   . Diabetes Father   . Hypertension Mother   . Breast cancer Mother     Allergies  Allergen Reactions  . Bactrim [Sulfamethoxazole-Trimethoprim] Hives    Current Outpatient Medications on File Prior to Visit  Medication Sig Dispense Refill  . aspirin 81 MG tablet Take 81 mg by mouth daily.      Marland Kitchen lisinopril-hydrochlorothiazide (PRINZIDE,ZESTORETIC) 10-12.5 MG tablet Take 1 tablet by mouth daily. 90 tablet 1  . nitrofurantoin, macrocrystal-monohydrate, (MACROBID) 100 MG capsule Take 1 capsule (100 mg total) by mouth 2 (two) times daily. (Patient not taking: Reported on 10/15/2017) 14 capsule 0  . phenazopyridine (PYRIDIUM) 200 MG tablet Take 1 tablet (  200 mg total) by mouth 3 (three) times daily as needed for pain. (Patient not taking: Reported on 10/15/2017) 10 tablet 0   No current facility-administered medications on file prior to visit.     BP 116/69   Pulse 97   Temp 98 F (36.7 C) (Oral)   Resp 16   Ht 5\' 3"  (1.6 m)   Wt 165 lb 9.6 oz (75.1 kg)   LMP  (LMP Unknown)   SpO2 97%   BMI 29.33 kg/m       Objective:   Physical Exam  General  Mental Status - Alert. General Appearance - Well groomed. Not in acute distress.  Skin Rashes- No Rashes.  HEENT Head- Normal. Ear Auditory Canal - Left- Normal. Right - Normal.Tympanic Membrane- Left- Normal. Right-moderate red TM. Eye Sclera/Conjunctiva- Left- Normal. Right- Normal. Nose & Sinuses Nasal Mucosa-  Left-  Boggy and Congested. Right-  Boggy and  Congested.Bilateral maxillary and frontal sinus pressure. Mouth & Throat Lips: Upper Lip- Normal: no dryness, cracking, pallor, cyanosis, or vesicular eruption. Lower Lip-Normal: no dryness, cracking, pallor, cyanosis or vesicular eruption. Buccal Mucosa- Bilateral- No Aphthous ulcers. Oropharynx- No Discharge or Erythema. Tonsils: Characteristics- Bilateral-moderate erythema. Size/Enlargement- Bilateral- No enlargement. Discharge- bilateral-None.  Neck Neck- Supple. No Masses.   Chest and Lung Exam Auscultation: Breath Sounds:-Clear even and unlabored.  Cardiovascular Auscultation:Rythm- Regular, rate and rhythm. Murmurs & Other Heart Sounds:Ausculatation of the heart reveal- No Murmurs.  Lymphatic Head & Neck General Head & Neck Lymphatics: Bilateral: Description-moderate large and mild tender submandibular nodes.       Assessment & Plan:  Your illness over the past week may have started out as a flu since you report diffuse severe body aches with quick onset that started 7 days ago.  However presently appears may have secondary infections in sinuses and right ear.  Your rapid strep test came back negative.  Important to note that there is possibility of false negative rapid strep.  We did not do flu test today and in the event you did have flulike syndrome onset 7 days ago it is too late to treat the flu.(Actually patient declined the test/offered by [facility])  For sinus infection and ear infection, I am prescribing Augmentin.  For recent severe cough, I prescribed Hycodan. For nasal congestion, prescribe Flonase.  If you do get yeast infection I am making Diflucan available to use.  Follow-up in 7 days or as needed.  Mackie Pai, PA-C

## 2017-10-15 NOTE — Patient Instructions (Signed)
Your illness over the past week may have started out as a flu since you report diffuse severe body aches with quick onset that started 7 days ago.  However presently appears may have secondary infections in sinuses and right ear.  Your rapid strep test came back negative.  Important to note that there is possibility of false negative rapid strep.  We did not do flu test today and in the event you did have flulike syndrome onset 7 days ago it is too late to treat the flu.  For sinus infection and ear infection, I am prescribing Augmentin.  For recent severe cough, I prescribed Hycodan. For nasal congestion, prescribe Flonase.  If you do get yeast infection I am making Diflucan available to use.  Follow-up in 7 days or as needed.

## 2017-10-17 ENCOUNTER — Ambulatory Visit: Payer: PPO

## 2017-10-21 ENCOUNTER — Ambulatory Visit: Payer: PPO

## 2017-10-27 ENCOUNTER — Telehealth: Payer: Self-pay | Admitting: Family Medicine

## 2017-10-27 NOTE — Telephone Encounter (Signed)
Copied from [place] [phone] [phone]. Topic: Quick Communication - Rx Refill/Question >> Oct 27, 2017 12:11 PM Sandi Mariscal E, NT wrote: Medication: Fluconazole 150 mg   Has the patient contacted their pharmacy? Yes   (Agent: If no, request that the patient contact the pharmacy for the refill.)   Preferred Pharmacy (with phone number or street name): [place], [place] - [place] [phone] (Phone) [phone] (Fax)     Agent: Please be advised that RX refills may take up to 3 business days. We ask that you follow-up with your pharmacy.

## 2017-10-28 ENCOUNTER — Ambulatory Visit (INDEPENDENT_AMBULATORY_CARE_PROVIDER_SITE_OTHER): Payer: PPO | Admitting: *Deleted

## 2017-10-28 DIAGNOSIS — E538 Deficiency of other specified B group vitamins: Secondary | ICD-10-CM

## 2017-10-28 MED ORDER — CYANOCOBALAMIN 1000 MCG/ML IJ SOLN
1000.0000 ug | Freq: Once | INTRAMUSCULAR | Status: AC
  Administered 2017-10-28: 1000 ug via INTRAMUSCULAR

## 2017-10-28 MED ORDER — FLUCONAZOLE 150 MG PO TABS: 150.0000 mg | ORAL_TABLET | Freq: Once | ORAL | 0 refills | Status: AC

## 2017-10-28 MED FILL — FLUCONAZOLE 150 MG TABS: 150 | 1 days supply | Qty: 1 | Fill #0

## 2017-10-28 NOTE — Telephone Encounter (Signed)
rx sent to [facility]

## 2017-10-28 NOTE — Telephone Encounter (Signed)
Patient called to ask about her symptoms for needing the fluconazole, she said "I was started on Augmentin and was given a prescription for Fluconazole 1 tab, which I took last week. I'm still having symptoms of a yeast infections, so I need 1 more pill to take. I will be over there this afternoon for a B12 injection and was wondering if I can pick up the prescription when I come by." I advised this would be sent to the provider and she can stop by the desk to check, but there is no guarantee, she verbalized understanding.

## 2017-10-28 NOTE — Progress Notes (Signed)
Reviewed  Dominique Gonzalez R Lowne Chase, DO  

## 2017-10-28 NOTE — Progress Notes (Signed)
Pre visit review using our clinic review tool, if applicable. No additional management support is needed unless otherwise documented below in the visit note.  Pt here for monthly B12 injection.  B12 1045mcg given IM, left deltoid.  Pt tolerated injection well.  Lab Results  Component Value Date   [id number] 098 05/23/2016    Next injection scheduled for 11/28/17 at 2:30pm.

## 2017-11-12 ENCOUNTER — Ambulatory Visit: Payer: PPO | Admitting: Medical

## 2017-11-12 NOTE — Progress Notes (Addendum)
[facility] [place], [place], [place] [address] [phone]  Date:  11/13/2017   Name:  Dominique Gonzalez   DOB:  17-Feb-1951   MRN:  [id number]  PCP:  Ann Held, DO    Chief Complaint: Urinary Tract Infection (urinary frequency, lower back pain, nausea, 4 days, taking advil but making nausea worse)   History of Present Illness:  Dominique Gonzalez is a 67 y.o. very pleasant female patient who presents with the following:  Here today with concern of UTI Pt of Dr. Etter Sjogren Hypertension  She noted onset of sx about 4 days ago with having to get up to urinate a lot, esp at night She tried drinking a lot of water to flush it out She has noted some lower back pain and nausea These are typical sx for her She has not noted any fever- she has checked her temp at home No vomiting but "almost" She is using advil for her back which sometimes makes her stomach hurt  No blood in her urine No vaginal sx  She is s/p menopause   Patient Active Problem List   Diagnosis Date Noted  . Right knee pain 04/25/2016  . Dysuria 04/25/2015  . Acute upper respiratory infection 04/25/2015  . Acute frontal sinusitis 07/13/2014  . UTI (urinary tract infection) 02/02/2014  . Multiple thyroid nodules 05/25/2013  . Fatigue 08/30/2011  . URI (upper respiratory infection) 01/16/2011  . VAGINITIS, ATROPHIC 10/09/2010  . AORTIC VALVE DISORDERS 01/04/2010  . ECHOCARDIOGRAM, ABNORMAL 12/12/2009  . DIASTOLIC DYSFUNCTION 51/88/4166  . B12 DEFICIENCY 10/19/2009  . FATIGUE 10/09/2009  . CARDIAC MURMUR 10/09/2009  . SINUSITIS - ACUTE-NOS 11/10/2008  . URI 11/10/2008  . LEG PAIN, RIGHT 10/14/2008  . HIP PAIN, RIGHT 10/06/2008  . HYPERLIPIDEMIA 02/25/2008  . OSTEOPENIA 07/28/2007  . Essential hypertension 02/25/2007  . SHOULDER PAIN, LEFT 02/25/2007    Past Medical History:  Diagnosis Date  . Aortic insufficiency    mild  . B12  deficiency   . Cardiac murmur   . Diastolic dysfunction   . Fatigue   . Hip pain, right   . HTN (hypertension)   . Hyperlipidemia   . Leg pain, right   . Osteopenia   . Shoulder pain, left   . Sinusitis, acute    NOS  . URI (upper respiratory infection)     Past Surgical History:  Procedure Laterality Date  . ABDOMINAL HYSTERECTOMY     age 28  . BILATERAL OOPHORECTOMY      Social History   Tobacco Use  . Smoking status: Former Smoker    Last attempt to quit: 01/10/1996    Years since quitting: 21.8  . Smokeless tobacco: Never Used  . Tobacco comment: smoked for 10 years,   Substance Use Topics  . Alcohol use: No  . Drug use: No    Family History  Problem Relation Age of Onset  . Hypertension Father   . Diabetes Father   . Hypertension Mother   . Breast cancer Mother     Allergies  Allergen Reactions  . Macrobid [Nitrofurantoin] Nausea And Vomiting    vomiting  . Bactrim [Sulfamethoxazole-Trimethoprim] Hives    Medication list has been reviewed and updated.  Current Outpatient Medications on File Prior to Visit  Medication Sig Dispense Refill  . aspirin 81 MG tablet Take 81 mg by mouth daily.      Marland Kitchen lisinopril-hydrochlorothiazide (PRINZIDE,ZESTORETIC) 10-12.5  MG tablet Take 1 tablet by mouth daily. 90 tablet 1   No current facility-administered medications on file prior to visit.     Review of Systems:  As per HPI- otherwise negative.   Physical Examination: Vitals:   11/13/17 1031  BP: (!) 121/59  Pulse: 74  Resp: 16  SpO2: 99%   Vitals:   11/13/17 1031  Weight: 164 lb 6.4 oz (74.6 kg)  Height: 5\' 3"  (1.6 m)   Body mass index is 29.12 kg/m. Ideal Body Weight: Weight in (lb) to have BMI = 25: 140.8  GEN: WDWN, NAD, Non-toxic, A & O x 3, overweight, looks well  HEENT: Atraumatic, Normocephalic. Neck supple. No masses, No LAD. Ears and Nose: No external deformity. CV: RRR, No M/G/R. No JVD. No thrill. No extra heart sounds. PULM: CTA  B, no wheezes, crackles, rhonchi. No retractions. No resp. distress. No accessory muscle use. ABD: S, NT, ND EXTR: No c/c/e NEURO Normal gait.  PSYCH: Normally interactive. Conversant. Not depressed or anxious appearing.  Calm demeanor.  No CVA tenderness Belly is benign    Assessment and Plan: Urinary frequency - Plan: POCT Urinalysis Dipstick (Automated), Urine Culture, cephALEXin (KEFLEX) 500 MG capsule, fluconazole (DIFLUCAN) 150 MG tablet  Here today with presumed UTI Will start treatment with keflex and diflucan if needed for yeast infection sx Follow-up pending her culture   Signed Lamar Blinks, MD  Received her urine culture 3/24- gave her a call.  Let her know culture is negative,  Her sx are "a whole lot better" so she will finish out abx and let me know if sx return  Results for orders placed or performed in visit on 11/13/17  Urine Culture  Result Value Ref Range   MICRO NUMBER: [id number]    SPECIMEN QUALITY: ADEQUATE    Sample Source URINE    STATUS: FINAL    Result: No Growth   POCT Urinalysis Dipstick (Automated)  Result Value Ref Range   Color, UA YELLOW    Clarity, UA CLEAR    Glucose, UA NEGATIVE    Bilirubin, UA NEGATIVE    Ketones, UA NEGATIVE    Spec Grav, UA 1.015 1.010 - 1.025   Blood, UA NEGATIVE    pH, UA 7.0 5.0 - 8.0   Protein, UA NEGATIVE    Urobilinogen, UA 0.2 0.2 or 1.0 E.U./dL   Nitrite, UA NEGATIVE    Leukocytes, UA Negative Negative   Called pt-

## 2017-11-13 ENCOUNTER — Ambulatory Visit (INDEPENDENT_AMBULATORY_CARE_PROVIDER_SITE_OTHER): Payer: PPO | Admitting: Family Medicine

## 2017-11-13 ENCOUNTER — Encounter: Payer: Self-pay | Admitting: Family Medicine

## 2017-11-13 VITALS — BP 121/59 | HR 74 | Resp 16 | Ht 63.0 in | Wt 164.4 lb

## 2017-11-13 DIAGNOSIS — R350 Frequency of micturition: Secondary | ICD-10-CM

## 2017-11-13 LAB — POC URINALSYSI DIPSTICK (AUTOMATED)
Bilirubin, UA: NEGATIVE
Blood, UA: NEGATIVE
Glucose, UA: NEGATIVE
Ketones, UA: NEGATIVE
Leukocytes, UA: NEGATIVE
Nitrite, UA: NEGATIVE
Protein, UA: NEGATIVE
Spec Grav, UA: 1.015 (ref 1.010–1.025)
Urobilinogen, UA: 0.2 E.U./dL
pH, UA: 7 (ref 5.0–8.0)

## 2017-11-13 MED ORDER — CEPHALEXIN 500 MG PO CAPS: 500.0000 mg | ORAL_CAPSULE | Freq: Two times a day (BID) | ORAL | 0 refills | Status: DC

## 2017-11-13 MED ORDER — FLUCONAZOLE 150 MG PO TABS
150.0000 mg | ORAL_TABLET | Freq: Once | ORAL | 0 refills | Status: AC
Start: 2017-11-13 — End: 2017-11-13

## 2017-11-13 MED FILL — CEPHALEXIN 500 MG CAPSULE: 500 | 7 days supply | Qty: 14 | Fill #0

## 2017-11-13 MED FILL — FLUCONAZOLE 150 MG TABS: 150 | 8 days supply | Qty: 2 | Fill #0

## 2017-11-13 NOTE — Patient Instructions (Signed)
Good to see you today- I will be in touch with your urine culture Use the keflex for presumed UTI- twice a day for 7 days Also can use diflucan for any yeast infection Please let me know if you are getting worse, fever, etc

## 2017-11-14 LAB — URINE CULTURE
MICRO NUMBER:: 90357144
Result:: NO GROWTH
SPECIMEN QUALITY: ADEQUATE

## 2017-11-28 ENCOUNTER — Ambulatory Visit (INDEPENDENT_AMBULATORY_CARE_PROVIDER_SITE_OTHER): Payer: PPO | Admitting: Emergency Medicine

## 2017-11-28 DIAGNOSIS — E538 Deficiency of other specified B group vitamins: Secondary | ICD-10-CM | POA: Diagnosis not present

## 2017-11-28 MED ORDER — CYANOCOBALAMIN 1000 MCG/ML IJ SOLN
1000.0000 ug | Freq: Once | INTRAMUSCULAR | Status: AC
  Administered 2017-11-28: 1000 ug via INTRAMUSCULAR

## 2017-11-28 NOTE — Progress Notes (Signed)
Pre visit review using our clinic review tool, if applicable. No additional management support is needed unless otherwise documented below in the visit note.   Pt came in clinic for monthly B12 injection. IM injection given in right deltoid. Patient tolerated injection well. Next appointment schedule for 12/30/17 at 2pm.

## 2017-12-16 ENCOUNTER — Encounter: Payer: Self-pay | Admitting: Family Medicine

## 2017-12-16 ENCOUNTER — Ambulatory Visit (INDEPENDENT_AMBULATORY_CARE_PROVIDER_SITE_OTHER): Payer: PPO | Admitting: Family Medicine

## 2017-12-16 VITALS — BP 122/70 | HR 70 | Resp 16 | Ht 63.0 in | Wt 164.0 lb

## 2017-12-16 DIAGNOSIS — I10 Essential (primary) hypertension: Secondary | ICD-10-CM | POA: Diagnosis not present

## 2017-12-16 NOTE — Progress Notes (Signed)
Patient ID: Beatriz Quintela, female    DOB: 1950/12/30  Age: 67 y.o. MRN: [id number]    Subjective:  Subjective  HPI Alexarae Pribble presents for bp check.  It was runnig high at home with 2 different cuffs.  No other symptoms   Review of Systems  Constitutional: Negative for appetite change, diaphoresis, fatigue and unexpected weight change.  Eyes: Negative for pain, redness and visual disturbance.  Respiratory: Negative for cough, chest tightness, shortness of breath and wheezing.   Cardiovascular: Negative for chest pain, palpitations and leg swelling.  Endocrine: Negative for cold intolerance, heat intolerance, polydipsia, polyphagia and polyuria.  Genitourinary: Negative for difficulty urinating, dysuria and frequency.  Neurological: Negative for dizziness, light-headedness, numbness and headaches.    History Past Medical History:  Diagnosis Date  . Aortic insufficiency    mild  . B12 deficiency   . Cardiac murmur   . Diastolic dysfunction   . Fatigue   . Hip pain, right   . HTN (hypertension)   . Hyperlipidemia   . Leg pain, right   . Osteopenia   . Shoulder pain, left   . Sinusitis, acute    NOS  . URI (upper respiratory infection)     She has a past surgical history that includes Abdominal hysterectomy and Bilateral oophorectomy.   Her family history includes Breast cancer in her mother; Diabetes in her father; Hypertension in her father and mother.She reports that she quit smoking about 21 years ago. She has never used smokeless tobacco. She reports that she does not drink alcohol or use drugs.  Current Outpatient Medications on File Prior to Visit  Medication Sig Dispense Refill  . aspirin 81 MG tablet Take 81 mg by mouth daily.      Marland Kitchen lisinopril-hydrochlorothiazide (PRINZIDE,ZESTORETIC) 10-12.5 MG tablet Take 1 tablet by mouth daily. 90 tablet 1   No current facility-administered medications on file prior to visit.      Objective:  Objective  Physical  Exam  Constitutional: She is oriented to person, place, and time. She appears well-developed and well-nourished.  HENT:  Head: Normocephalic and atraumatic.  Eyes: Conjunctivae and EOM are normal.  Neck: Normal range of motion. Neck supple. No JVD present. Carotid bruit is not present. No thyromegaly present.  Cardiovascular: Normal rate and regular rhythm.  Murmur heard. Pulmonary/Chest: Effort normal and breath sounds normal. No respiratory distress. She has no wheezes. She has no rales. She exhibits no tenderness.  Musculoskeletal: She exhibits no edema.  Neurological: She is alert and oriented to person, place, and time.  Psychiatric: She has a normal mood and affect.  Nursing note and vitals reviewed.  BP 122/70 (BP Location: Right Arm, Patient Position: Sitting, Cuff Size: Normal)   Pulse 70   Resp 16   Ht 5\' 3"  (1.6 m)   Wt 164 lb (74.4 kg)   LMP  (LMP Unknown)   SpO2 98%   BMI 29.05 kg/m  Wt Readings from Last 3 Encounters:  12/16/17 164 lb (74.4 kg)  11/13/17 164 lb 6.4 oz (74.6 kg)  10/15/17 165 lb 9.6 oz (75.1 kg)     Lab Results  Component Value Date   WBC 7.2 05/02/2015   HGB 13.6 05/02/2015   HCT 40.3 05/02/2015   PLT 276.0 05/02/2015   GLUCOSE 100 (H) 09/09/2017   CHOL 160 09/09/2017   TRIG 115.0 09/09/2017   HDL 39.30 09/09/2017   LDLDIRECT 149.3 07/28/2007   LDLCALC 97 09/09/2017   ALT 14 09/09/2017   AST  17 09/09/2017   NA 139 09/09/2017   K 3.4 (L) 09/09/2017   CL 103 09/09/2017   CREATININE 0.67 09/09/2017   BUN 7 09/09/2017   CO2 28 09/09/2017   TSH 1.37 05/02/2015   HGBA1C 5.4 06/05/2016    Dg Knee Complete 4 Views Right  Result Date: 04/28/2016 CLINICAL DATA:  Stood from a seated position on her sofa this evening and heard her RIGHT leg pop, unable to bear weight since on RIGHT leg, having lateral RIGHT hip pain, anterior and posterior knee pain, intermittent RIGHT leg numbness, history hypertension, hyperlipidemia, initial encounter EXAM:  RIGHT KNEE - COMPLETE 4+ VIEW COMPARISON:  None. FINDINGS: Osseous demineralization. Diffuse joint space narrowing with spur formation at medial compartment. No acute fracture, dislocation or bone destruction. No knee joint effusion. 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 No acute abnormalities. Electronically Signed   By: Lavonia Dana M.D.   On: 04/28/2016 21:23   Dg Hip Unilat With Pelvis 2-3 Views Right  Result Date: 04/28/2016 CLINICAL DATA:  Stood from a seated position on her sofa this evening and heard her RIGHT leg pop, unable to bear weight since on RIGHT leg, having lateral RIGHT hip pain, anterior and posterior knee pain, intermittent RIGHT leg numbness, history hypertension, hyperlipidemia, initial encounter EXAM: DG HIP (WITH OR WITHOUT PELVIS) 2-3V RIGHT COMPARISON:  None. FINDINGS: Diffuse osseous demineralization. Minimal narrowing of the hip joints. Pelvis intact. No acute fracture, dislocation or bone destruction. IMPRESSION: Osseous demineralization without acute bony abnormalities. Electronically Signed   By: Lavonia Dana M.D.   On: 04/28/2016 21:19   Dg Femur Min 2 Views Right  Result Date: 04/28/2016 CLINICAL DATA:  Stood from a seated position on her sofa this evening and heard her RIGHT leg pop, unable to bear weight since on RIGHT leg, having lateral RIGHT hip pain, anterior and posterior knee pain, intermittent RIGHT leg numbness, history hypertension, hyperlipidemia, initial encounter EXAM: RIGHT FEMUR 2 VIEWS COMPARISON:  None. FINDINGS: RIGHT knee and RIGHT hip radiographs reported separately. Osseous demineralization. No acute fracture, dislocation or bone destruction. IMPRESSION: No acute osseous abnormalities. Electronically Signed   By: Lavonia Dana M.D.   On: 04/28/2016 21:24     Assessment & Plan:  Plan  I have discontinued [place] Coil's cephALEXin. I am also having her maintain her aspirin and lisinopril-hydrochlorothiazide.  No  orders of the defined types were placed in this encounter.   Problem List Items Addressed This Visit      Unprioritized   Essential hypertension - Primary    Well controlled, no changes to meds. Encouraged heart healthy diet such as the DASH diet and exercise as tolerated.   Pt is welcome to come in with her bp cuffs and have [place] check them against ours.           Follow-up: Return if symptoms worsen or fail to improve.  Ann Held, DO

## 2017-12-16 NOTE — Assessment & Plan Note (Signed)
Well controlled, no changes to meds. Encouraged heart healthy diet such as the DASH diet and exercise as tolerated.   Pt is welcome to come in with her bp cuffs and have [place] check them against ours.

## 2017-12-16 NOTE — Patient Instructions (Signed)
DASH Eating Plan DASH stands for "Dietary Approaches to Stop Hypertension." The DASH eating plan is a healthy eating plan that has been shown to reduce high blood pressure (hypertension). It may also reduce your risk for type 2 diabetes, heart disease, and stroke. The DASH eating plan may also help with weight loss. What are tips for following this plan? General guidelines  Avoid eating more than 2,300 mg (milligrams) of salt (sodium) a day. If you have hypertension, you may need to reduce your sodium intake to 1,500 mg a day.  Limit alcohol intake to no more than 1 drink a day for nonpregnant women and 2 drinks a day for men. One drink equals 12 oz of beer, 5 oz of wine, or 1 oz of hard liquor.  Work with your health care provider to maintain a healthy body weight or to lose weight. Ask what an ideal weight is for you.  Get at least 30 minutes of exercise that causes your heart to beat faster (aerobic exercise) most days of the week. Activities may include walking, swimming, or biking.  Work with your health care provider or diet and nutrition specialist (dietitian) to adjust your eating plan to your individual calorie needs. Reading food labels  Check food labels for the amount of sodium per serving. Choose foods with less than 5 percent of the Daily Value of sodium. Generally, foods with less than 300 mg of sodium per serving fit into this eating plan.  To find whole grains, look for the word "whole" as the first word in the ingredient list. Shopping  Buy products labeled as "low-sodium" or "no salt added."  Buy fresh foods. Avoid canned foods and premade or frozen meals. Cooking  Avoid adding salt when cooking. Use salt-free seasonings or herbs instead of table salt or sea salt. Check with your health care provider or pharmacist before using salt substitutes.  Do not fry foods. Cook foods using healthy methods such as baking, boiling, grilling, and broiling instead.  Cook with  heart-healthy oils, such as olive, canola, soybean, or sunflower oil. Meal planning   Eat a balanced diet that includes: ? 5 or more servings of fruits and vegetables each day. At each meal, try to fill half of your plate with fruits and vegetables. ? Up to 6-8 servings of whole grains each day. ? Less than 6 oz of lean meat, poultry, or fish each day. A 3-oz serving of meat is about the same size as a deck of cards. One egg equals 1 oz. ? 2 servings of low-fat dairy each day. ? A serving of nuts, seeds, or beans 5 times each week. ? Heart-healthy fats. Healthy fats called Omega-3 fatty acids are found in foods such as flaxseeds and coldwater fish, like sardines, salmon, and mackerel.  Limit how much you eat of the following: ? Canned or prepackaged foods. ? Food that is high in trans fat, such as fried foods. ? Food that is high in saturated fat, such as fatty meat. ? Sweets, desserts, sugary drinks, and other foods with added sugar. ? Full-fat dairy products.  Do not salt foods before eating.  Try to eat at least 2 vegetarian meals each week.  Eat more home-cooked food and less restaurant, buffet, and fast food.  When eating at a restaurant, ask that your food be prepared with less salt or no salt, if possible. What foods are recommended? The items listed may not be a complete list. Talk with your dietitian about what   dietary choices are best for you. Grains Whole-grain or whole-wheat bread. Whole-grain or whole-wheat pasta. Brown rice. Oatmeal. Quinoa. Bulgur. Whole-grain and low-sodium cereals. Pita bread. Low-fat, low-sodium crackers. Whole-wheat flour tortillas. Vegetables Fresh or frozen vegetables (raw, steamed, roasted, or grilled). Low-sodium or reduced-sodium tomato and vegetable juice. Low-sodium or reduced-sodium tomato sauce and tomato paste. Low-sodium or reduced-sodium canned vegetables. Fruits All fresh, dried, or frozen fruit. Canned fruit in natural juice (without  added sugar). Meat and other protein foods Skinless chicken or turkey. Ground chicken or turkey. Pork with fat trimmed off. Fish and seafood. Egg whites. Dried beans, peas, or lentils. Unsalted nuts, nut butters, and seeds. Unsalted canned beans. Lean cuts of beef with fat trimmed off. Low-sodium, lean deli meat. Dairy Low-fat (1%) or fat-free (skim) milk. Fat-free, low-fat, or reduced-fat cheeses. Nonfat, low-sodium ricotta or cottage cheese. Low-fat or nonfat yogurt. Low-fat, low-sodium cheese. Fats and oils Soft margarine without trans fats. Vegetable oil. Low-fat, reduced-fat, or light mayonnaise and salad dressings (reduced-sodium). Canola, safflower, olive, soybean, and sunflower oils. Avocado. Seasoning and other foods Herbs. Spices. Seasoning mixes without salt. Unsalted popcorn and pretzels. Fat-free sweets. What foods are not recommended? The items listed may not be a complete list. Talk with your dietitian about what dietary choices are best for you. Grains Baked goods made with fat, such as croissants, muffins, or some breads. Dry pasta or rice meal packs. Vegetables Creamed or fried vegetables. Vegetables in a cheese sauce. Regular canned vegetables (not low-sodium or reduced-sodium). Regular canned tomato sauce and paste (not low-sodium or reduced-sodium). Regular tomato and vegetable juice (not low-sodium or reduced-sodium). Pickles. Olives. Fruits Canned fruit in a light or heavy syrup. Fried fruit. Fruit in cream or butter sauce. Meat and other protein foods Fatty cuts of meat. Ribs. Fried meat. Bacon. Sausage. Bologna and other processed lunch meats. Salami. Fatback. Hotdogs. Bratwurst. Salted nuts and seeds. Canned beans with added salt. Canned or smoked fish. Whole eggs or egg yolks. Chicken or turkey with skin. Dairy Whole or 2% milk, cream, and half-and-half. Whole or full-fat cream cheese. Whole-fat or sweetened yogurt. Full-fat cheese. Nondairy creamers. Whipped toppings.  Processed cheese and cheese spreads. Fats and oils Butter. Stick margarine. Lard. Shortening. Ghee. Bacon fat. Tropical oils, such as coconut, palm kernel, or palm oil. Seasoning and other foods Salted popcorn and pretzels. Onion salt, garlic salt, seasoned salt, table salt, and sea salt. Worcestershire sauce. Tartar sauce. Barbecue sauce. Teriyaki sauce. Soy sauce, including reduced-sodium. Steak sauce. Canned and packaged gravies. Fish sauce. Oyster sauce. Cocktail sauce. Horseradish that you find on the shelf. Ketchup. Mustard. Meat flavorings and tenderizers. Bouillon cubes. Hot sauce and Tabasco sauce. Premade or packaged marinades. Premade or packaged taco seasonings. Relishes. Regular salad dressings. Where to find more information:  National Heart, Lung, and Blood Institute: www.nhlbi.nih.gov  American Heart Association: www.heart.org Summary  The DASH eating plan is a healthy eating plan that has been shown to reduce high blood pressure (hypertension). It may also reduce your risk for type 2 diabetes, heart disease, and stroke.  With the DASH eating plan, you should limit salt (sodium) intake to 2,300 mg a day. If you have hypertension, you may need to reduce your sodium intake to 1,500 mg a day.  When on the DASH eating plan, aim to eat more fresh fruits and vegetables, whole grains, lean proteins, low-fat dairy, and heart-healthy fats.  Work with your health care provider or diet and nutrition specialist (dietitian) to adjust your eating plan to your individual   calorie needs. This information is not intended to replace advice given to you by your health care provider. Make sure you discuss any questions you have with your health care provider. Document Released: 08/01/2011 Document Revised: 08/05/2016 Document Reviewed: 08/05/2016 Elsevier Interactive Patient Education  2018 Elsevier Inc.  

## 2017-12-30 ENCOUNTER — Ambulatory Visit (INDEPENDENT_AMBULATORY_CARE_PROVIDER_SITE_OTHER): Payer: PPO | Admitting: *Deleted

## 2017-12-30 DIAGNOSIS — E538 Deficiency of other specified B group vitamins: Secondary | ICD-10-CM

## 2017-12-30 MED ORDER — CYANOCOBALAMIN 1000 MCG/ML IJ SOLN
1000.0000 ug | Freq: Once | INTRAMUSCULAR | Status: AC
  Administered 2017-12-30: 1000 ug via INTRAMUSCULAR

## 2017-12-30 NOTE — Progress Notes (Signed)
Reviewed  Yvonne R Lowne Chase, DO  

## 2017-12-30 NOTE — Progress Notes (Signed)
Pt here for B12 injection per Dr Carollee Herter.  B12 1026mcg given IM, right deltoid per pt's request and she tolerated injection well.  Next B12 injection scheduled for 01/30/18 at 2:30pm.

## 2018-01-23 ENCOUNTER — Ambulatory Visit (INDEPENDENT_AMBULATORY_CARE_PROVIDER_SITE_OTHER): Payer: PPO

## 2018-01-23 DIAGNOSIS — E538 Deficiency of other specified B group vitamins: Secondary | ICD-10-CM

## 2018-01-23 MED ORDER — CYANOCOBALAMIN 1000 MCG/ML IJ SOLN
1000.0000 ug | Freq: Once | INTRAMUSCULAR | Status: AC
  Administered 2018-01-23: 1000 ug via INTRAMUSCULAR

## 2018-01-23 NOTE — Progress Notes (Signed)
Pt here for monthly B12 injection per Dr. Carollee Herter  B12 1022mcg given IM left deltoid, and pt tolerated injection well.  Next B12 injection scheduled for July 2nd 2019 at 2:30

## 2018-01-30 ENCOUNTER — Ambulatory Visit: Payer: PPO

## 2018-01-30 ENCOUNTER — Ambulatory Visit: Payer: PPO | Admitting: Family Medicine

## 2018-02-24 ENCOUNTER — Ambulatory Visit: Payer: PPO

## 2018-02-25 ENCOUNTER — Ambulatory Visit: Payer: PPO

## 2018-03-03 ENCOUNTER — Ambulatory Visit (INDEPENDENT_AMBULATORY_CARE_PROVIDER_SITE_OTHER): Payer: PPO

## 2018-03-03 DIAGNOSIS — E538 Deficiency of other specified B group vitamins: Secondary | ICD-10-CM | POA: Diagnosis not present

## 2018-03-03 MED ORDER — CYANOCOBALAMIN 1000 MCG/ML IJ SOLN
1000.0000 ug | Freq: Once | INTRAMUSCULAR | Status: AC
  Administered 2018-03-03: 1000 ug via INTRAMUSCULAR

## 2018-03-03 NOTE — Progress Notes (Signed)
Reviewed  Dominique R Lowne Chase, DO  

## 2018-03-03 NOTE — Progress Notes (Signed)
Pre visit review using our clinic tool,if applicable. No additional management support is needed unless otherwise documented below in the visit note.   Pt here for monthly B12 injection per order from [facility].    B12 1073mcg given IM, and pt tolerated injection well.  No complaints voiced this visit.  Next B12 injection scheduled for  03/2018. Patient aware.

## 2018-03-05 NOTE — Progress Notes (Deleted)
Subjective:   Dominique Gonzalez is a 67 y.o. female who presents for an Initial Medicare Annual Wellness Visit.  Review of Systems   No ROS.  Medicare Wellness Visit. Additional risk factors are reflected in the social history.   Sleep patterns: Home Safety/Smoke Alarms: Feels safe in home. Smoke alarms in place.  Living environment; residence and Firearm Safety:    Female:   Pap-  hysterectomy     Mammo- active order      Dexa scan-        [facility]-     Objective:    There were no vitals filed for this visit. There is no height or weight on file to calculate BMI.  Advanced Directives 04/28/2016 10/15/2014  Does Patient Have a Medical Advance Directive? No No  Would patient like information on creating a medical advance directive? No - patient declined information No - patient declined information    Current Medications (verified) Outpatient Encounter Medications as of 03/13/2018  Medication Sig  . aspirin 81 MG tablet Take 81 mg by mouth daily.    Marland Kitchen lisinopril-hydrochlorothiazide (PRINZIDE,ZESTORETIC) 10-12.5 MG tablet Take 1 tablet by mouth daily.   No facility-administered encounter medications on file as of 03/13/2018.     Allergies (verified) Macrobid [nitrofurantoin] and Bactrim [sulfamethoxazole-trimethoprim]   History: Past Medical History:  Diagnosis Date  . Aortic insufficiency    mild  . B12 deficiency   . Cardiac murmur   . Diastolic dysfunction   . Fatigue   . Hip pain, right   . HTN (hypertension)   . Hyperlipidemia   . Leg pain, right   . Osteopenia   . Shoulder pain, left   . Sinusitis, acute    NOS  . URI (upper respiratory infection)    Past Surgical History:  Procedure Laterality Date  . ABDOMINAL HYSTERECTOMY     age 84  . BILATERAL OOPHORECTOMY     Family History  Problem Relation Age of Onset  . Hypertension Father   . Diabetes Father   . Hypertension Mother   . Breast cancer Mother    Social History   Socioeconomic History  .  Marital status: Married    Spouse name: Not on file  . Number of children: 2  . Years of education: Not on file  . Highest education level: Not on file  Occupational History  . Not on file  Social Needs  . Financial resource strain: Not on file  . Food insecurity:    Worry: Not on file    Inability: Not on file  . Transportation needs:    Medical: Not on file    Non-medical: Not on file  Tobacco Use  . Smoking status: Former Smoker    Last attempt to quit: 01/10/1996    Years since quitting: 22.1  . Smokeless tobacco: Never Used  . Tobacco comment: smoked for 10 years,   Substance and Sexual Activity  . Alcohol use: No  . Drug use: No  . Sexual activity: Not on file  Lifestyle  . Physical activity:    Days per week: Not on file    Minutes per session: Not on file  . Stress: Not on file  Relationships  . Social connections:    Talks on phone: Not on file    Gets together: Not on file    Attends religious service: Not on file    Active member of club or organization: Not on file    Attends meetings of clubs  or organizations: Not on file    Relationship status: Not on file  Other Topics Concern  . Not on file  Social History Narrative  . Not on file    Tobacco Counseling Counseling given: Not Answered Comment: smoked for 10 years,    Clinical Intake:                        Activities of Daily Living In your present state of health, do you have any difficulty performing the following activities: 09/09/2017  Hearing? N  Vision? N  Difficulty concentrating or making decisions? N  Walking or climbing stairs? N  Dressing or bathing? N  Doing errands, shopping? N  Some recent data might be hidden     Immunizations and Health Maintenance  There is no immunization history on file for this patient. Health Maintenance Due  Topic Date Due  . Hepatitis C Screening  1950/09/28  . HIV Screening  07/19/1967  . TETANUS/TDAP  07/19/1971  . COLONOSCOPY   07/18/2002  . DEXA SCAN  07/18/2017  . PNA vac Low Risk Adult (1 of 2 - PCV13) 07/18/2017    Patient Care Team: Carollee Herter, Alferd Apa, DO as PCP - General  Indicate any recent Medical Services you may have received from other than [facility] providers in the past year (date may be approximate).     Assessment:   This is a routine wellness examination for Dian. Physical assessment deferred to PCP.  Hearing/Vision screen No exam data present  Dietary issues and exercise activities discussed:   Diet (meal preparation, eat out, water intake, caffeinated beverages, dairy products, fruits and vegetables): {Desc; diets:16563} Breakfast: Lunch:  Dinner:      Goals    None     Depression Screen PHQ 2/9 Scores 09/09/2017  PHQ - 2 Score 0    Fall Risk Fall Risk  09/09/2017  Falls in the past year? No   Cognitive Function:        Screening Tests Health Maintenance  Topic Date Due  . Hepatitis C Screening  [birth year]  . HIV Screening  07/19/1967  . TETANUS/TDAP  07/19/1971  . COLONOSCOPY  07/18/2002  . DEXA SCAN  07/18/2017  . PNA vac Low Risk Adult (1 of 2 - PCV13) 07/18/2017  . INFLUENZA VACCINE  04/27/2018 (Originally 03/26/2018)  . MAMMOGRAM  12/20/2018      Plan:   ***  I have personally reviewed and noted the following in the patient's chart:   . Medical and social history . Use of alcohol, tobacco or illicit drugs  . Current medications and supplements . Functional ability and status . Nutritional status . Physical activity . Advanced directives . List of other physicians . Hospitalizations, surgeries, and ER visits in previous 12 months . Vitals . Screenings to include cognitive, depression, and falls . Referrals and appointments  In addition, I have reviewed and discussed with patient certain preventive protocols, quality metrics, and best practice recommendations. A written personalized care plan for preventive services as well as general preventive  health recommendations were provided to patient.     Shela Nevin, [place]   03/05/2018

## 2018-03-10 ENCOUNTER — Encounter: Payer: Self-pay | Admitting: Family Medicine

## 2018-03-10 ENCOUNTER — Ambulatory Visit (INDEPENDENT_AMBULATORY_CARE_PROVIDER_SITE_OTHER): Payer: PPO | Admitting: Family Medicine

## 2018-03-10 VITALS — BP 118/72 | HR 84 | Temp 98.1°F | Ht 64.0 in | Wt 163.6 lb

## 2018-03-10 DIAGNOSIS — I10 Essential (primary) hypertension: Secondary | ICD-10-CM

## 2018-03-10 DIAGNOSIS — Z1159 Encounter for screening for other viral diseases: Secondary | ICD-10-CM | POA: Diagnosis not present

## 2018-03-10 DIAGNOSIS — E2839 Other primary ovarian failure: Secondary | ICD-10-CM

## 2018-03-10 DIAGNOSIS — Z0000 Encounter for general adult medical examination without abnormal findings: Secondary | ICD-10-CM | POA: Diagnosis not present

## 2018-03-10 LAB — LIPID PANEL
CHOLESTEROL: 170 mg/dL (ref 0–200)
HDL: 44.4 mg/dL (ref >39.00)
LDL Cholesterol: 96 mg/dL (ref 0–99)
NonHDL: 125.85
TRIGLYCERIDES: 147 mg/dL (ref 0.0–149.0)
Total CHOL/HDL Ratio: 4
VLDL: 29.4 mg/dL (ref 0.0–40.0)

## 2018-03-10 LAB — COMPREHENSIVE METABOLIC PANEL
ALBUMIN: 4.2 g/dL (ref 3.5–5.2)
ALK PHOS: 100 U/L (ref 39–117)
ALT: 16 U/L (ref 0–35)
AST: 20 U/L (ref 0–37)
BUN: 5 mg/dL — ABNORMAL LOW (ref 6–23)
CALCIUM: 9.1 mg/dL (ref 8.4–10.5)
CO2: 30 mEq/L (ref 19–32)
CREATININE: 0.69 mg/dL (ref 0.40–1.20)
Chloride: 104 mEq/L (ref 96–112)
GFR: 90.57 mL/min (ref >60.00)
Glucose, Bld: 92 mg/dL (ref 70–99)
POTASSIUM: 4.1 meq/L (ref 3.5–5.1)
Sodium: 140 mEq/L (ref 135–145)
TOTAL PROTEIN: 6.9 g/dL (ref 6.0–8.3)
Total Bilirubin: 0.5 mg/dL (ref 0.2–1.2)

## 2018-03-10 LAB — CBC WITH DIFFERENTIAL/PLATELET
BASOS ABS: 0.1 10*3/uL (ref 0.0–0.1)
Basophils Relative: 1.1 % (ref 0.0–3.0)
EOS ABS: 0.2 10*3/uL (ref 0.0–0.7)
Eosinophils Relative: 3.9 % (ref 0.0–5.0)
HCT: 40.7 % (ref 36.0–46.0)
HEMOGLOBIN: 14 g/dL (ref 12.0–15.0)
Lymphocytes Relative: 35.3 % (ref 12.0–46.0)
Lymphs Abs: 2 10*3/uL (ref 0.7–4.0)
MCHC: 34.5 g/dL (ref 30.0–36.0)
MCV: 88 fl (ref 78.0–100.0)
MONO ABS: 0.4 10*3/uL (ref 0.1–1.0)
Monocytes Relative: 7.6 % (ref 3.0–12.0)
Neutro Abs: 2.9 10*3/uL (ref 1.4–7.7)
Neutrophils Relative %: 52.1 % (ref 43.0–77.0)
Platelets: 221 10*3/uL (ref 150.0–400.0)
RBC: 4.62 Mil/uL (ref 3.87–5.11)
RDW: 13.4 % (ref 11.5–15.5)
WBC: 5.5 10*3/uL (ref 4.0–10.5)

## 2018-03-10 NOTE — Progress Notes (Signed)
Subjective:     Dominique Gonzalez is a 67 y.o. female and is here for a comprehensive physical exam. The patient reports no problems.  Social History   Socioeconomic History  . Marital status: Married    Spouse name: Not on file  . Number of children: 2  . Years of education: Not on file  . Highest education level: Not on file  Occupational History  . Not on file  Social Needs  . Financial resource strain: Not on file  . Food insecurity:    Worry: Not on file    Inability: Not on file  . Transportation needs:    Medical: Not on file    Non-medical: Not on file  Tobacco Use  . Smoking status: Former Smoker    Last attempt to quit: 01/10/1996    Years since quitting: 22.1  . Smokeless tobacco: Never Used  . Tobacco comment: smoked for 10 years,   Substance and Sexual Activity  . Alcohol use: No  . Drug use: No  . Sexual activity: Not on file  Lifestyle  . Physical activity:    Days per week: Not on file    Minutes per session: Not on file  . Stress: Not on file  Relationships  . Social connections:    Talks on phone: Not on file    Gets together: Not on file    Attends religious service: Not on file    Active member of club or organization: Not on file    Attends meetings of clubs or organizations: Not on file    Relationship status: Not on file  . Intimate partner violence:    Fear of current or ex partner: Not on file    Emotionally abused: Not on file    Physically abused: Not on file    Forced sexual activity: Not on file  Other Topics Concern  . Not on file  Social History Narrative  . Not on file   Health Maintenance  Topic Date Due  . Hepatitis C Screening  November 09, 1950  . HIV Screening  07/19/1967  . TETANUS/TDAP  07/19/1971  . COLONOSCOPY  07/18/2002  . DEXA SCAN  07/18/2017  . PNA vac Low Risk Adult (1 of 2 - PCV13) 07/18/2017  . INFLUENZA VACCINE  04/27/2018 (Originally 03/26/2018)  . MAMMOGRAM  12/20/2018    The following portions of the patient's  history were reviewed and updated as appropriate:  She  has a past medical history of Aortic insufficiency, B12 deficiency, Cardiac murmur, Diastolic dysfunction, Fatigue, Hip pain, right, HTN (hypertension), Hyperlipidemia, Leg pain, right, Osteopenia, Shoulder pain, left, Sinusitis, acute, and URI (upper respiratory infection). She does not have any pertinent problems on file. She  has a past surgical history that includes Abdominal hysterectomy and Bilateral oophorectomy. Her family history includes Breast cancer in her mother; Diabetes in her father; Hypertension in her father and mother. She  reports that she quit smoking about 22 years ago. She has never used smokeless tobacco. She reports that she does not drink alcohol or use drugs. She has a current medication list which includes the following prescription(s): aspirin and lisinopril-hydrochlorothiazide. Current Outpatient Medications on File Prior to Visit  Medication Sig Dispense Refill  . aspirin 81 MG tablet Take 81 mg by mouth daily.      Marland Kitchen lisinopril-hydrochlorothiazide (PRINZIDE,ZESTORETIC) 10-12.5 MG tablet Take 1 tablet by mouth daily. 90 tablet 1   No current facility-administered medications on file prior to visit.    She is  allergic to macrobid [nitrofurantoin] and bactrim [sulfamethoxazole-trimethoprim]..  Review of Systems Review of Systems  Constitutional: Negative for activity change, appetite change and fatigue.  HENT: Negative for hearing loss, congestion, tinnitus and ear discharge.  dentist q81m Eyes: Negative for visual disturbance (see optho q1y -- vision corrected to 20/20 with glasses).  Respiratory: Negative for cough, chest tightness and shortness of breath.   Cardiovascular: Negative for chest pain, palpitations and leg swelling.  Gastrointestinal: Negative for abdominal pain, diarrhea, constipation and abdominal distention.  Genitourinary: Negative for urgency, frequency, decreased urine volume and  difficulty urinating.  Musculoskeletal: Negative for back pain, arthralgias and gait problem.  Skin: Negative for color change, pallor and rash.  Neurological: Negative for dizziness, light-headedness, numbness and headaches.  Hematological: Negative for adenopathy. Does not bruise/bleed easily.  Psychiatric/Behavioral: Negative for suicidal ideas, confusion, sleep disturbance, self-injury, dysphoric mood, decreased concentration and agitation.      Objective:    BP 118/72 (BP Location: Left Arm, Patient Position: Sitting, Cuff Size: Normal)   Pulse 84   Temp 98.1 F (36.7 C) (Oral)   Ht 5\' 4"  (1.626 m)   Wt 163 lb 9.6 oz (74.2 kg)   LMP  (LMP Unknown)   SpO2 96%   BMI 28.08 kg/m  General appearance: alert, cooperative, appears stated age and no distress Head: Normocephalic, without obvious abnormality, atraumatic Eyes: negative findings: lids and lashes normal, conjunctivae and sclerae normal and pupils equal, round, reactive to light and accomodation Ears: normal TM's and external ear canals both ears Nose: Nares normal. Septum midline. Mucosa normal. No drainage or sinus tenderness. Throat: lips, mucosa, and tongue normal; teeth and gums normal Neck: no adenopathy, no carotid bruit, no JVD, supple, symmetrical, trachea midline and thyroid not enlarged, symmetric, no tenderness/mass/nodules Back: symmetric, no curvature. ROM normal. No CVA tenderness. Lungs: clear to auscultation bilaterally Breasts: pt do not want put gown on Heart: regular rate and rhythm, S1, S2 normal, no murmur, click, rub or gallop Abdomen: soft, non-tender; bowel sounds normal; no masses,  no organomegaly Pelvic: not indicated; post-menopausal, no abnormal Pap smears in past Extremities: extremities normal, atraumatic, no cyanosis or edema Pulses: 2+ and symmetric Skin: Skin color, texture, turgor normal. No rashes or lesions Lymph nodes: Cervical, supraclavicular, and axillary nodes  normal. Neurologic: Alert and oriented X 3, normal strength and tone. Normal symmetric reflexes. Normal coordination and gait    Assessment:    Healthy female exam.      Plan:    ghm utd Check labs To see rn for AWV friday See After Visit Summary for Counseling Recommendations    1. Preventative health care See above  2. Essential hypertension Well controlled, no changes to meds. Encouraged heart healthy diet such as the DASH diet and exercise as tolerated.  - CBC with Differential/Platelet - Lipid panel - Comprehensive metabolic panel  3. Estrogen deficiency   - DG Bone Density; Future  4. Need for hepatitis C screening test   - Hepatitis C antibody

## 2018-03-10 NOTE — Assessment & Plan Note (Signed)
Well controlled, no changes to meds. Encouraged heart healthy diet such as the DASH diet and exercise as tolerated.  °

## 2018-03-10 NOTE — Patient Instructions (Signed)
Preventive Care 40-64 Years, Female Preventive care refers to lifestyle choices and visits with your health care provider that can promote health and wellness. What does preventive care include?  A yearly physical exam. This is also called an annual well check.  Dental exams once or twice a year.  Routine eye exams. Ask your health care provider how often you should have your eyes checked.  Personal lifestyle choices, including: ? Daily care of your teeth and gums. ? Regular physical activity. ? Eating a healthy diet. ? Avoiding tobacco and drug use. ? Limiting alcohol use. ? Practicing safe sex. ? Taking low-dose aspirin daily starting at age 58. ? Taking vitamin and mineral supplements as recommended by your health care provider. What happens during an annual well check? The services and screenings done by your health care provider during your annual well check will depend on your age, overall health, lifestyle risk factors, and family history of disease. Counseling Your health care provider may ask you questions about your:  Alcohol use.  Tobacco use.  Drug use.  Emotional well-being.  Home and relationship well-being.  Sexual activity.  Eating habits.  Work and work Statistician.  Method of birth control.  Menstrual cycle.  Pregnancy history.  Screening You may have the following tests or measurements:  Height, weight, and BMI.  Blood pressure.  Lipid and cholesterol levels. These may be checked every 5 years, or more frequently if you are over 81 years old.  Skin check.  Lung cancer screening. You may have this screening every year starting at age 78 if you have a 30-pack-year history of smoking and currently smoke or have quit within the past 15 years.  Fecal occult blood test (FOBT) of the stool. You may have this test every year starting at age 65.  Flexible sigmoidoscopy or colonoscopy. You may have a sigmoidoscopy every 5 years or a colonoscopy  every 10 years starting at age 30.  Hepatitis C blood test.  Hepatitis B blood test.  Sexually transmitted disease (STD) testing.  Diabetes screening. This is done by checking your blood sugar (glucose) after you have not eaten for a while (fasting). You may have this done every 1-3 years.  Mammogram. This may be done every 1-2 years. Talk to your health care provider about when you should start having regular mammograms. This may depend on whether you have a family history of breast cancer.  BRCA-related cancer screening. This may be done if you have a family history of breast, ovarian, tubal, or peritoneal cancers.  Pelvic exam and Pap test. This may be done every 3 years starting at age 80. Starting at age 36, this may be done every 5 years if you have a Pap test in combination with an HPV test.  Bone density scan. This is done to screen for osteoporosis. You may have this scan if you are at high risk for osteoporosis.  Discuss your test results, treatment options, and if necessary, the need for more tests with your health care provider. Vaccines Your health care provider may recommend certain vaccines, such as:  Influenza vaccine. This is recommended every year.  Tetanus, diphtheria, and acellular pertussis (Tdap, Td) vaccine. You may need a Td booster every 10 years.  Varicella vaccine. You may need this if you have not been vaccinated.  Zoster vaccine. You may need this after age 5.  Measles, mumps, and rubella (MMR) vaccine. You may need at least one dose of MMR if you were born in  1957 or later. You may also need a second dose.  Pneumococcal 13-valent conjugate (PCV13) vaccine. You may need this if you have certain conditions and were not previously vaccinated.  Pneumococcal polysaccharide (PPSV23) vaccine. You may need one or two doses if you smoke cigarettes or if you have certain conditions.  Meningococcal vaccine. You may need this if you have certain  conditions.  Hepatitis A vaccine. You may need this if you have certain conditions or if you travel or work in places where you may be exposed to hepatitis A.  Hepatitis B vaccine. You may need this if you have certain conditions or if you travel or work in places where you may be exposed to hepatitis B.  Haemophilus influenzae type b (Hib) vaccine. You may need this if you have certain conditions.  Talk to your health care provider about which screenings and vaccines you need and how often you need them. This information is not intended to replace advice given to you by your health care provider. Make sure you discuss any questions you have with your health care provider. Document Released: 09/08/2015 Document Revised: 05/01/2016 Document Reviewed: 06/13/2015 Elsevier Interactive Patient Education  2018 [employer] Inc[employer]  

## 2018-03-11 LAB — HEPATITIS C ANTIBODY
HEP C AB: NONREACTIVE
SIGNAL TO CUT-OFF: 0.02 (ref <1.00)

## 2018-03-13 ENCOUNTER — Ambulatory Visit: Payer: PPO | Admitting: *Deleted

## 2018-03-13 ENCOUNTER — Other Ambulatory Visit: Payer: Self-pay | Admitting: Family Medicine

## 2018-03-13 DIAGNOSIS — I10 Essential (primary) hypertension: Secondary | ICD-10-CM

## 2018-03-17 IMAGING — DX DG KNEE COMPLETE 4+V*R*
4 series · 4 of 4 positions shown · non-contrast
Comparison: None.

CLINICAL DATA: Stood from a seated position on her sofa this
evening and heard her RIGHT leg pop, unable to bear weight since on
RIGHT leg, having lateral RIGHT hip pain, anterior and posterior
knee pain, intermittent RIGHT leg numbness, history hypertension,
hyperlipidemia, initial encounter

EXAM:
RIGHT KNEE - COMPLETE 4+ VIEW

[knee ap]
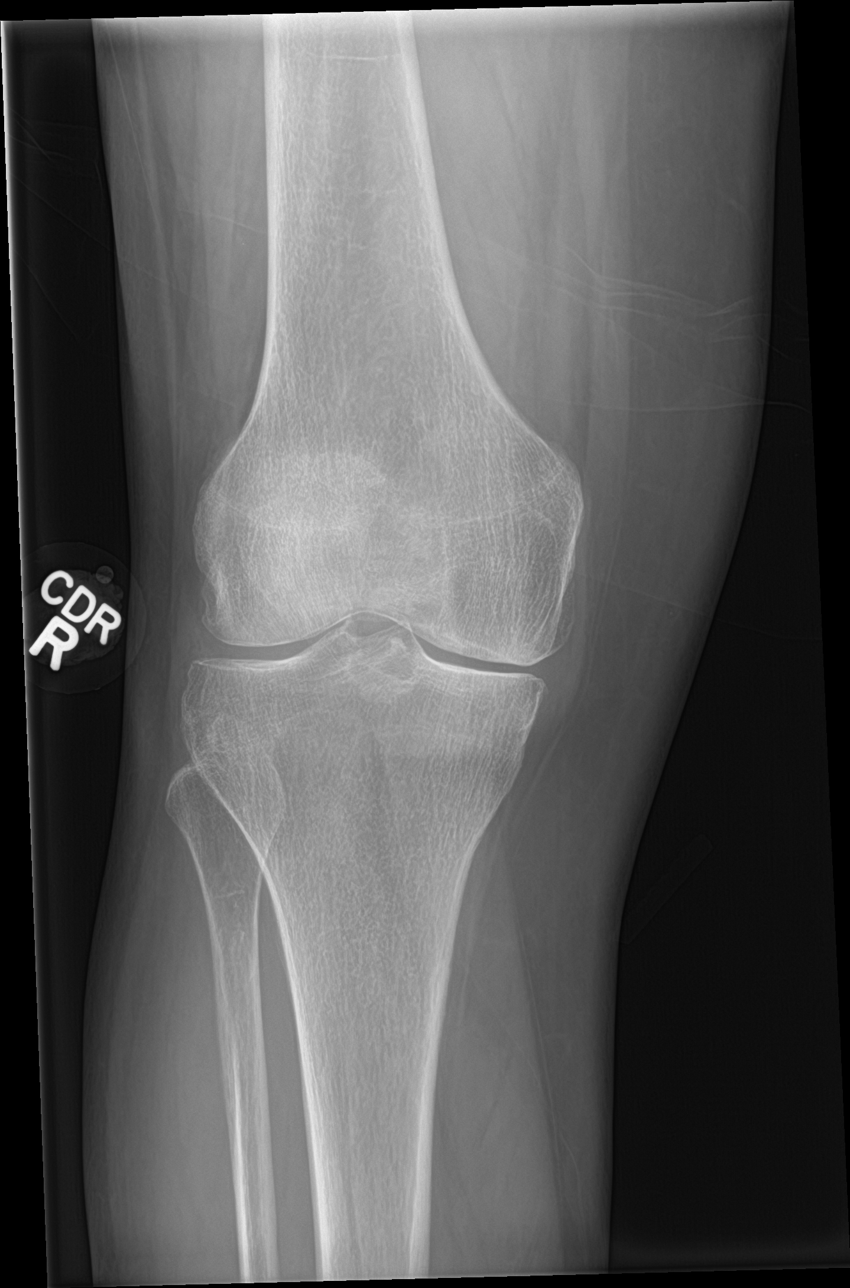

[knee lat]
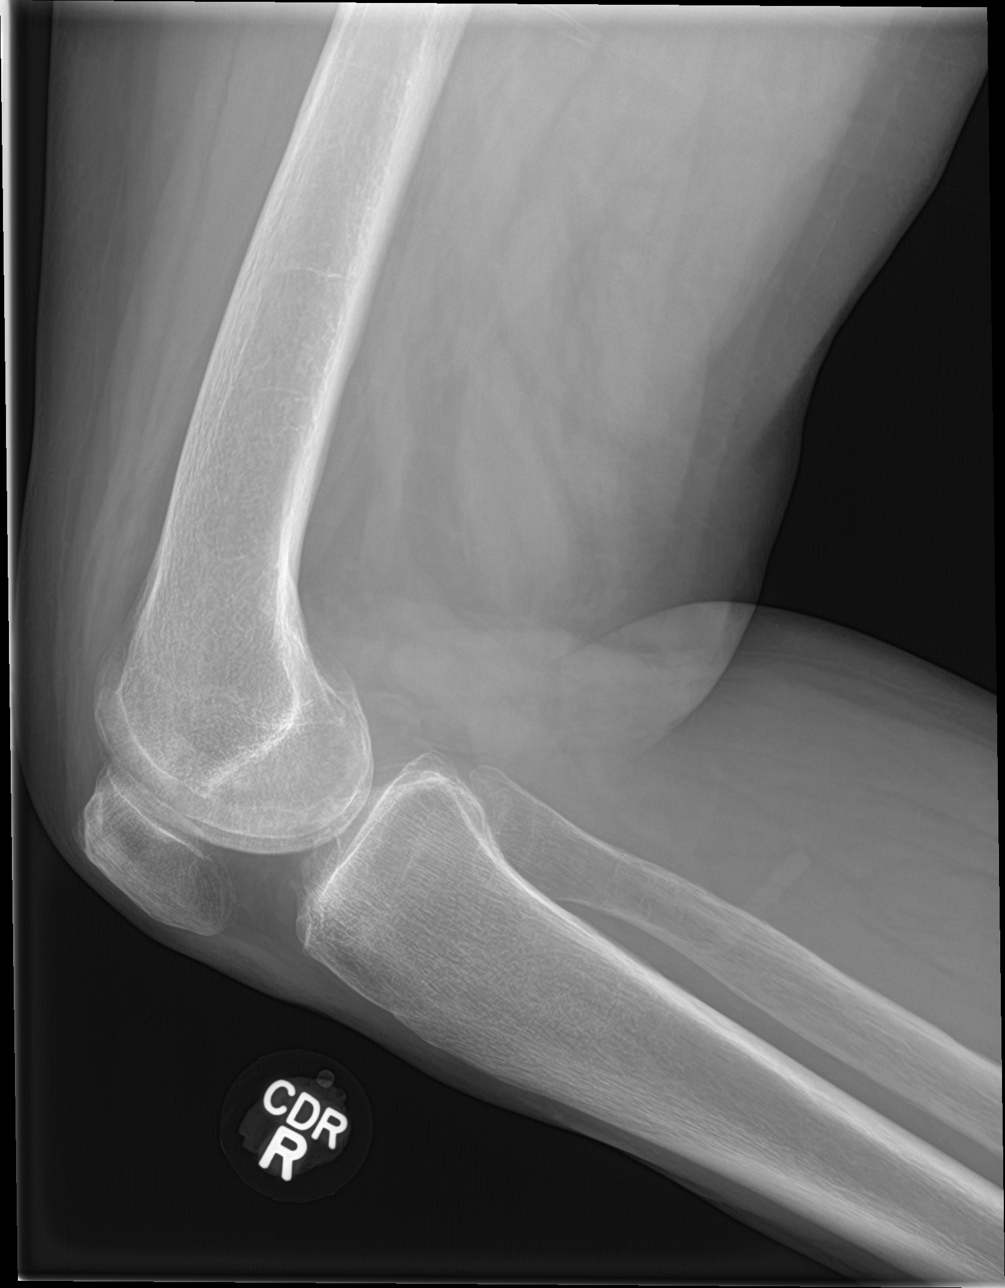

[knee obl (1 of 2)]
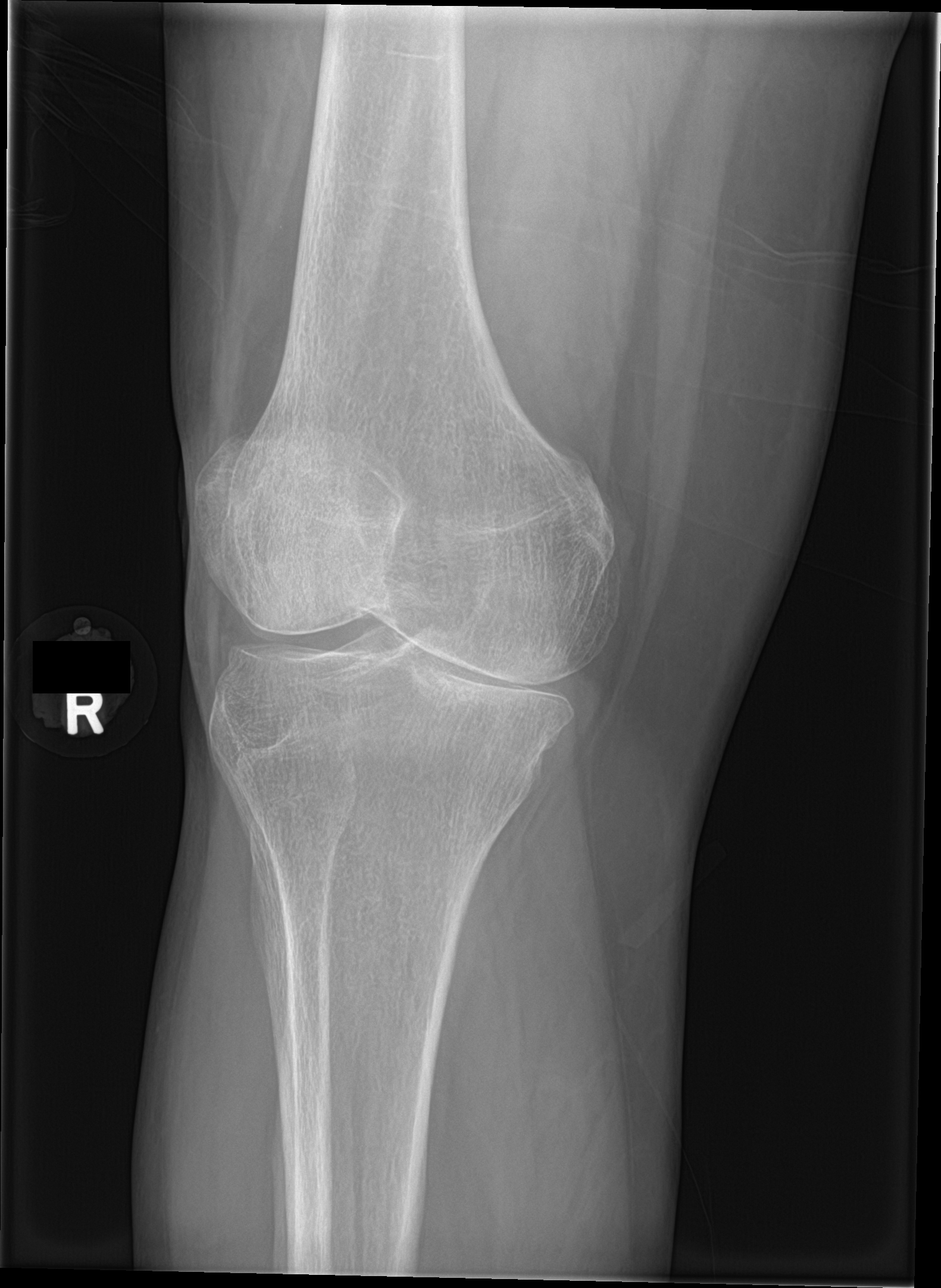

[knee obl (2 of 2)]
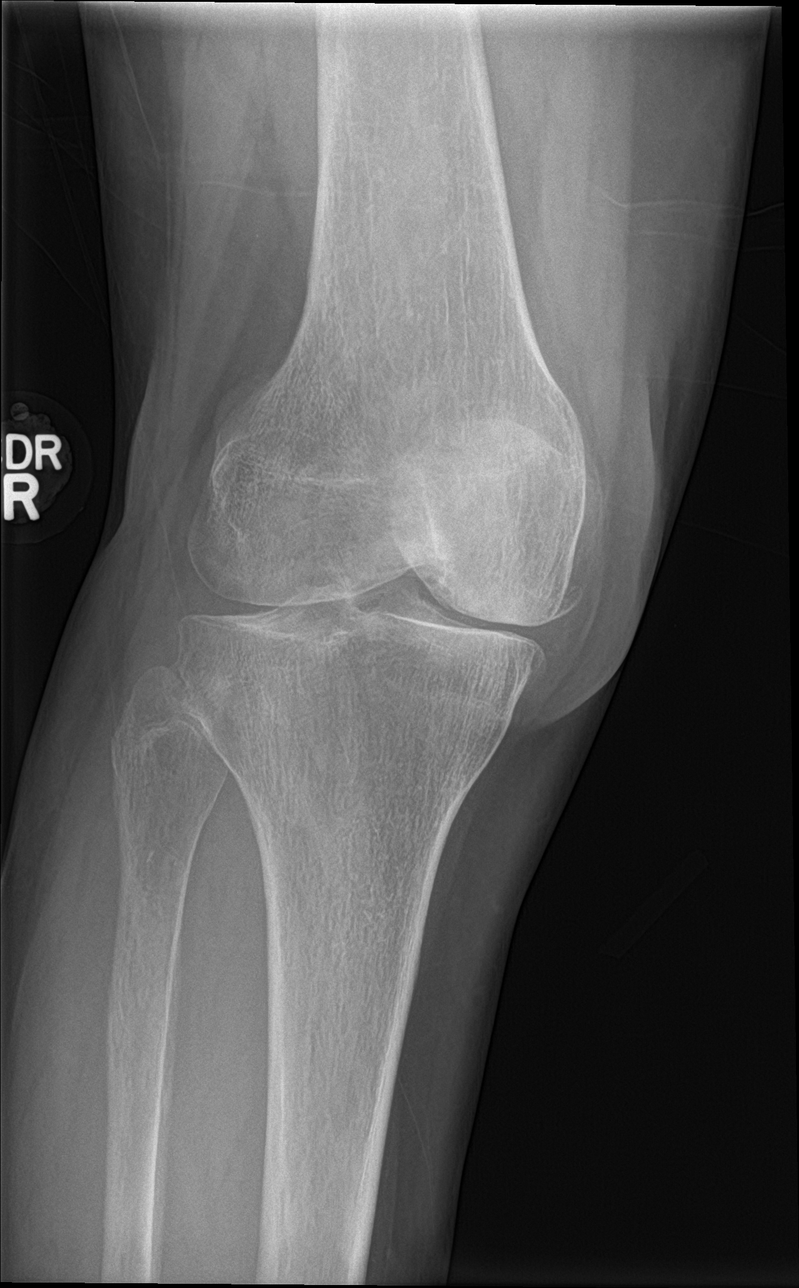

[4 of 4 positions shown; findings below may reference images not displayed]

FINDINGS: Osseous demineralization.

Diffuse joint space narrowing with spur formation at medial
compartment.

No acute fracture, dislocation or bone destruction.

No knee joint effusion.
IMPRESSION: Osseous demineralization with degenerative changes RIGHT knee.

No acute abnormalities.

## 2018-03-17 IMAGING — DX DG HIP (WITH OR WITHOUT PELVIS) 2-3V*R*
3 series · 3 of 3 positions shown · non-contrast
Comparison: None.

CLINICAL DATA: Stood from a seated position on her sofa this
evening and heard her RIGHT leg pop, unable to bear weight since on
RIGHT leg, having lateral RIGHT hip pain, anterior and posterior
knee pain, intermittent RIGHT leg numbness, history hypertension,
hyperlipidemia, initial encounter

EXAM:
DG HIP (WITH OR WITHOUT PELVIS) 2-3V RIGHT

[pelvis ap]
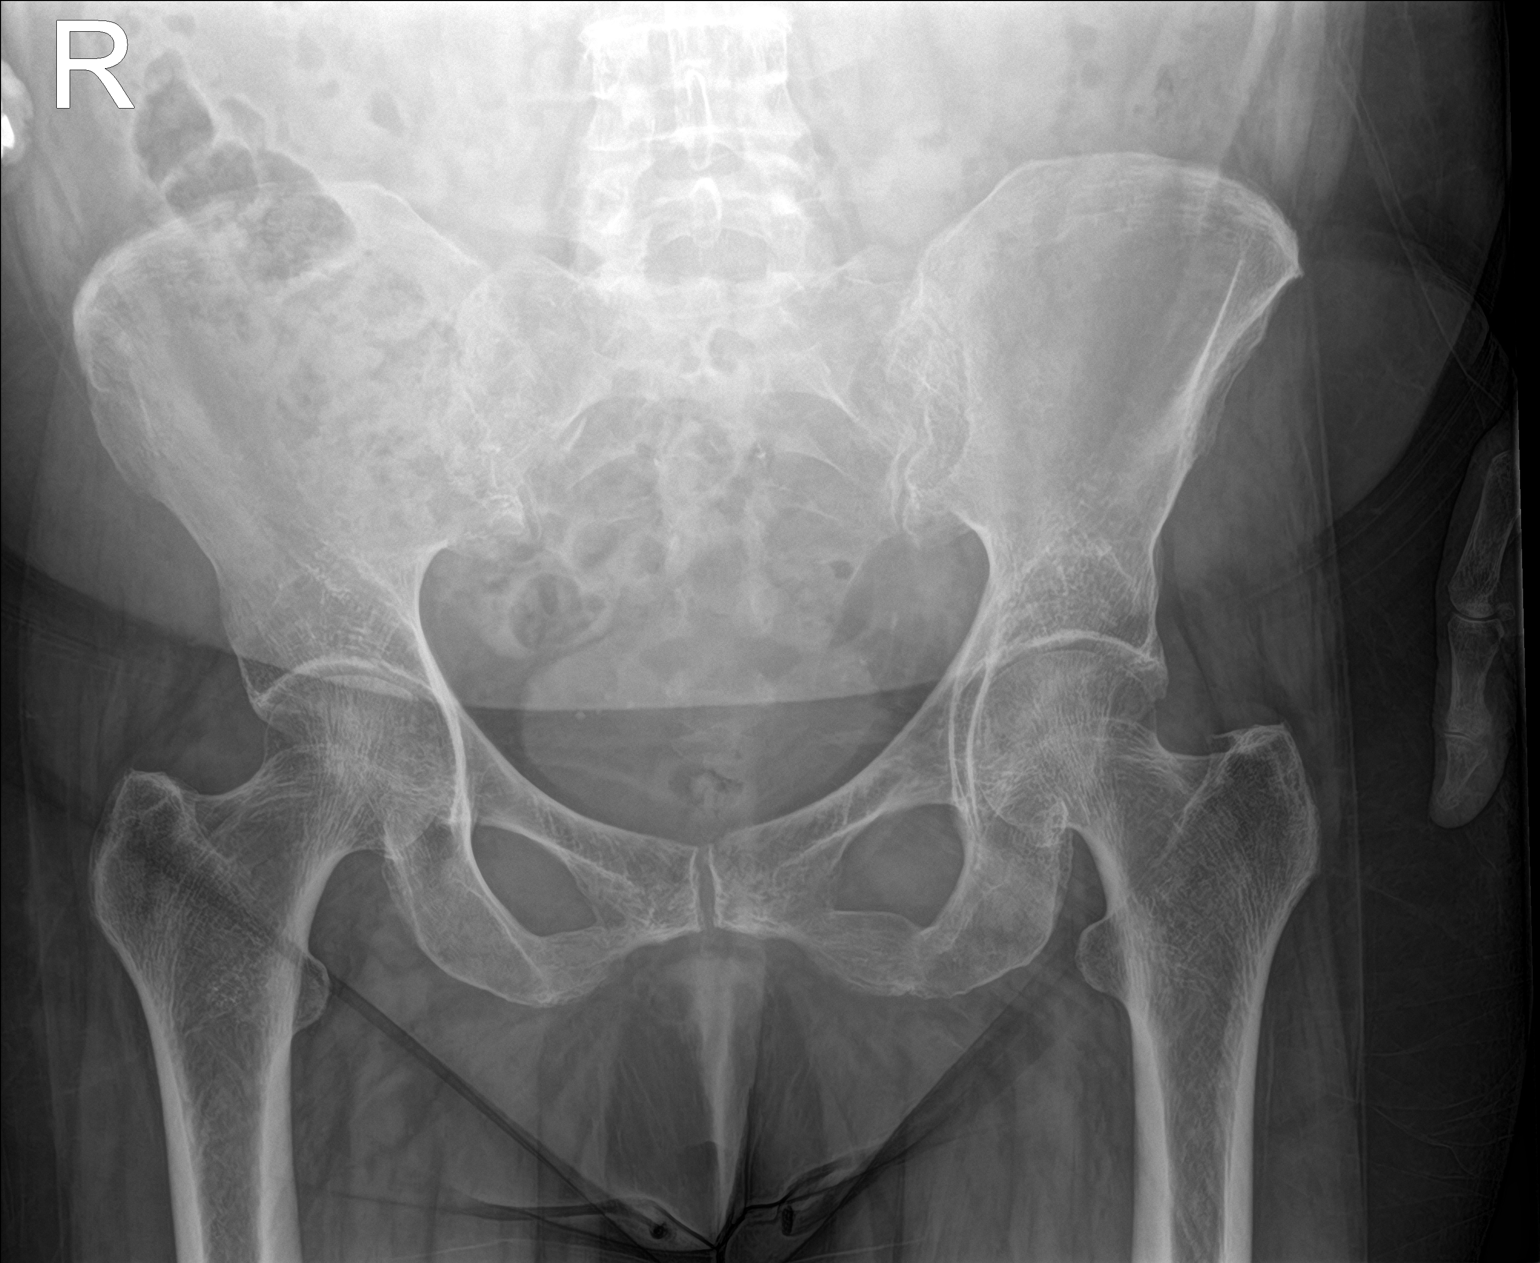

[hip ap]
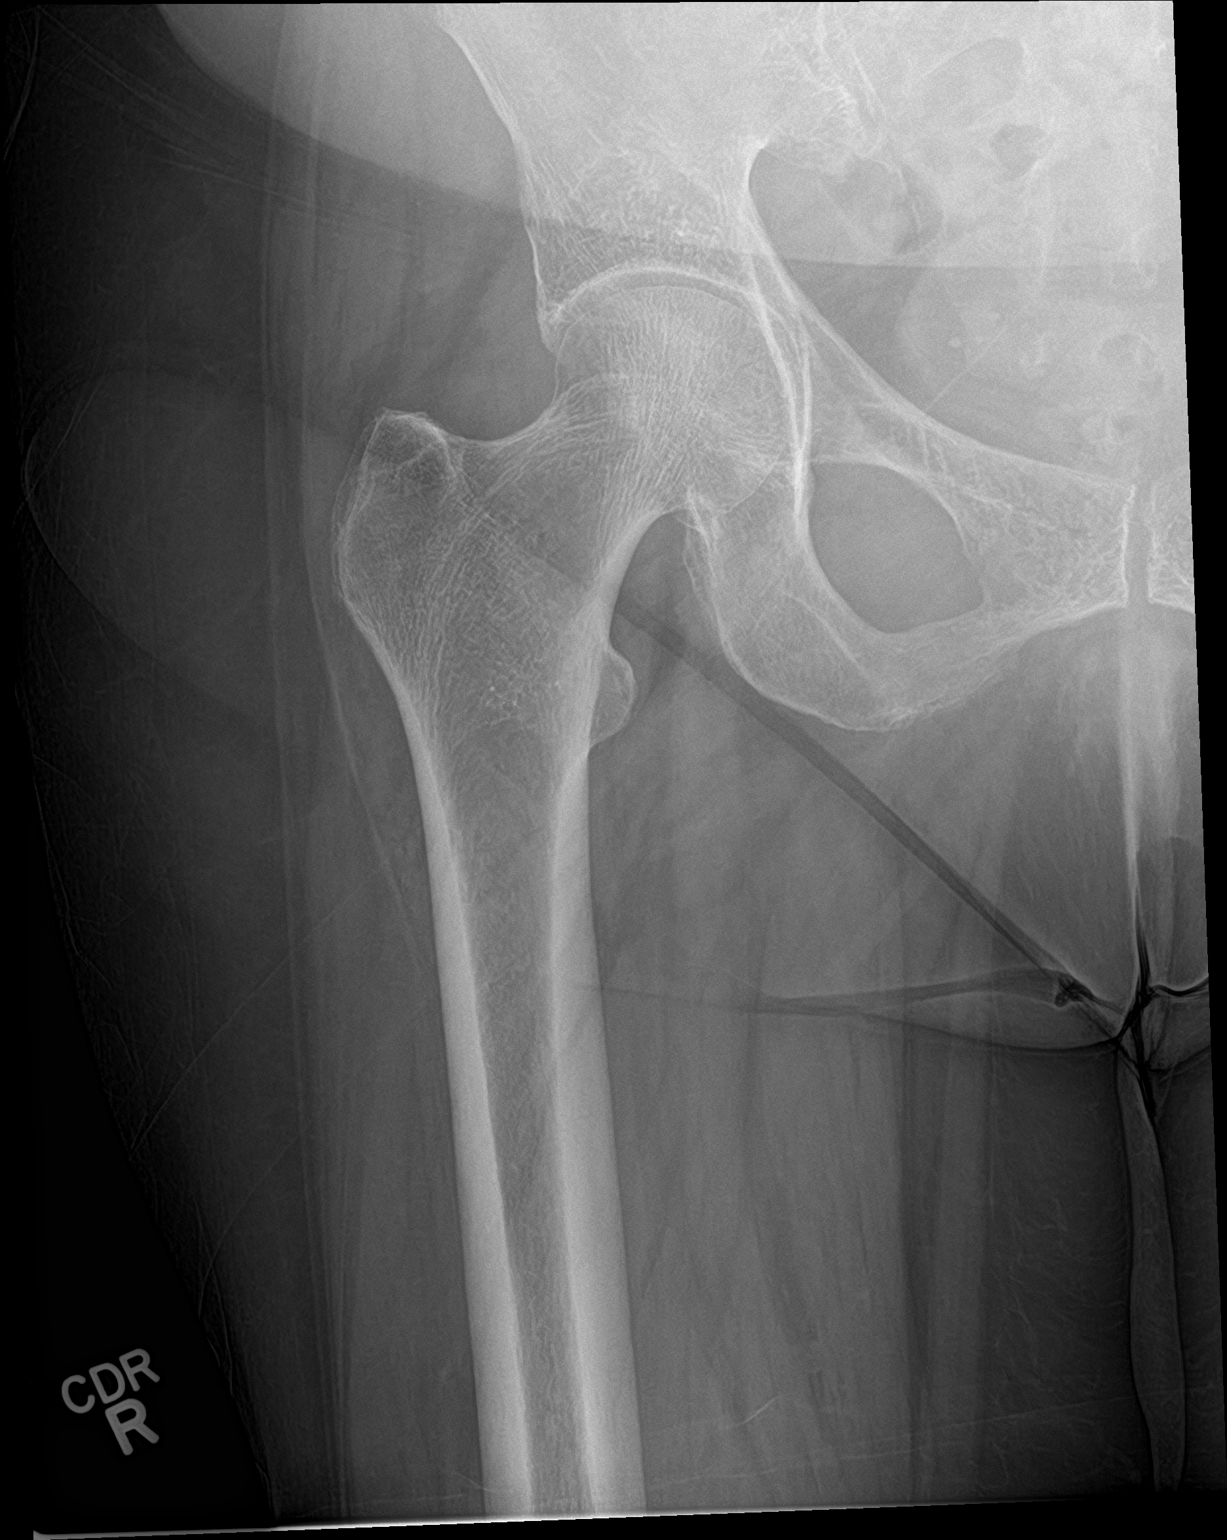

[hip lat]
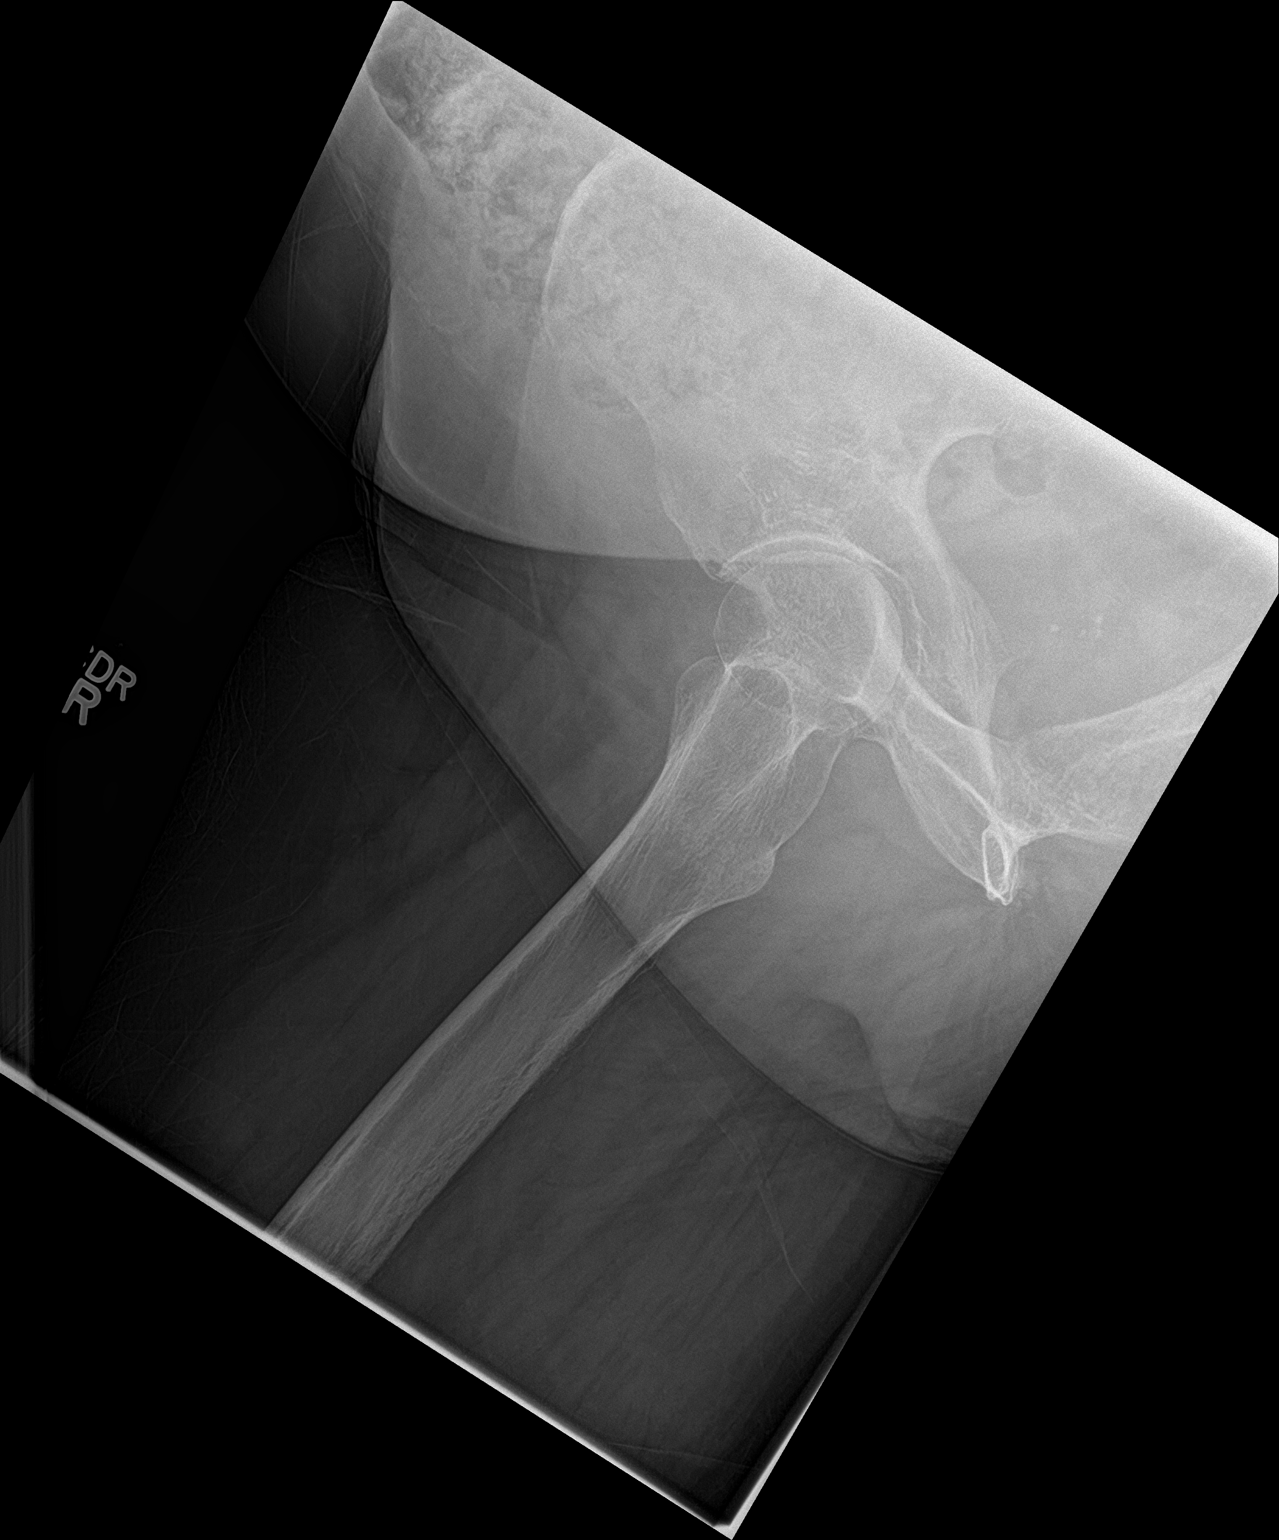

[3 of 3 positions shown; findings below may reference images not displayed]

FINDINGS: Diffuse osseous demineralization.

Minimal narrowing of the hip joints.

Pelvis intact.

No acute fracture, dislocation or bone destruction.
IMPRESSION: Osseous demineralization without acute bony abnormalities.

## 2018-03-26 ENCOUNTER — Ambulatory Visit: Payer: PPO | Admitting: *Deleted

## 2018-03-31 ENCOUNTER — Ambulatory Visit (INDEPENDENT_AMBULATORY_CARE_PROVIDER_SITE_OTHER): Payer: PPO

## 2018-03-31 DIAGNOSIS — E538 Deficiency of other specified B group vitamins: Secondary | ICD-10-CM | POA: Diagnosis not present

## 2018-03-31 MED ORDER — CYANOCOBALAMIN 1000 MCG/ML IJ SOLN
1000.0000 ug | Freq: Once | INTRAMUSCULAR | Status: AC
  Administered 2018-03-31: 1000 ug via INTRAMUSCULAR

## 2018-03-31 NOTE — Progress Notes (Signed)
Pre visit review using our clinic tool,if applicable. No additional management support is needed unless otherwise documented below in the visit note.   Pt here for monthly B12 injection per order from Dr. Carollee Herter.  B12 1033mcg given IM, and pt tolerated injection well.  Next B12 injection scheduled for 1 month. Patient aware.

## 2018-04-20 ENCOUNTER — Ambulatory Visit (INDEPENDENT_AMBULATORY_CARE_PROVIDER_SITE_OTHER): Payer: PPO | Admitting: Cardiovascular Disease

## 2018-04-20 VITALS — BP 116/62 | HR 72 | Ht 64.0 in | Wt 161.0 lb

## 2018-04-20 DIAGNOSIS — I10 Essential (primary) hypertension: Secondary | ICD-10-CM

## 2018-04-20 DIAGNOSIS — I351 Nonrheumatic aortic (valve) insufficiency: Secondary | ICD-10-CM | POA: Diagnosis not present

## 2018-04-20 DIAGNOSIS — I340 Nonrheumatic mitral (valve) insufficiency: Secondary | ICD-10-CM

## 2018-04-20 NOTE — Progress Notes (Signed)
Chief Complaint  Patient presents with  . Follow-up    aortic insufficiency    History of Present Illness: 67 yo female with history of aortic valve insufficiency, mitral regurgitation, HTN here today for follow up. She has been followed in my office since 2011 when she was found to have mild aortic valve insufficiency. Echo in 2011 showed normal LV systolic function, diastolic dysfunction, mild AI, small effusion and possible epicardial mass/aortic root mass. I ordered a cardiac MRI which showed prominent epicardial fat pad with no evidence of aortic or pericardial tumor or mass. Most recent echo Echo June 2018 with normal LV function, mild AI, mild MR.   She is here today for follow up. The patient denies any chest pain, dyspnea, palpitations, lower extremity edema, orthopnea, PND, dizziness, near syncope or syncope.   Primary Care Physician:  Carollee Herter, Alferd Apa, DO  Past Medical History:  Diagnosis Date  . Aortic insufficiency    mild  . B12 deficiency   . Cardiac murmur   . Diastolic dysfunction   . Fatigue   . Hip pain, right   . HTN (hypertension)   . Hyperlipidemia   . Leg pain, right   . Osteopenia   . Shoulder pain, left   . Sinusitis, acute    NOS  . URI (upper respiratory infection)     Past Surgical History:  Procedure Laterality Date  . ABDOMINAL HYSTERECTOMY     age 55  . BILATERAL OOPHORECTOMY      Current Outpatient Medications  Medication Sig Dispense Refill  . aspirin 81 MG tablet Take 81 mg by mouth daily.      Marland Kitchen lisinopril-hydrochlorothiazide (PRINZIDE,ZESTORETIC) 10-12.5 MG tablet TAKE 1 TABLET BY MOUTH EVERY DAY 90 tablet 1   No current facility-administered medications for this visit.     Allergies  Allergen Reactions  . Macrobid [Nitrofurantoin] Nausea And Vomiting    vomiting  . Bactrim [Sulfamethoxazole-Trimethoprim] Hives    Social History   Socioeconomic History  . Marital status: Married    Spouse name: Not on file  .  Number of children: 2  . Years of education: Not on file  . Highest education level: Not on file  Occupational History  . Not on file  Social Needs  . Financial resource strain: Not on file  . Food insecurity:    Worry: Not on file    Inability: Not on file  . Transportation needs:    Medical: Not on file    Non-medical: Not on file  Tobacco Use  . Smoking status: Former Smoker    Last attempt to quit: 01/10/1996    Years since quitting: 22.2  . Smokeless tobacco: Never Used  . Tobacco comment: smoked for 10 years,   Substance and Sexual Activity  . Alcohol use: No  . Drug use: No  . Sexual activity: Not on file  Lifestyle  . Physical activity:    Days per week: Not on file    Minutes per session: Not on file  . Stress: Not on file  Relationships  . Social connections:    Talks on phone: Not on file    Gets together: Not on file    Attends religious service: Not on file    Active member of club or organization: Not on file    Attends meetings of clubs or organizations: Not on file    Relationship status: Not on file  . Intimate partner violence:    Fear of current  or ex partner: Not on file    Emotionally abused: Not on file    Physically abused: Not on file    Forced sexual activity: Not on file  Other Topics Concern  . Not on file  Social History Narrative  . Not on file    Family History  Problem Relation Age of Onset  . Hypertension Father   . Diabetes Father   . Hypertension Mother   . Breast cancer Mother     Review of Systems:  As stated in the HPI and otherwise negative.   BP 116/62   Pulse 72   Ht 5\' 4"  (1.626 m)   Wt 161 lb (73 kg)   LMP  (LMP Unknown)   BMI 27.64 kg/m   Physical Examination:  General: Well developed, well nourished, NAD  HEENT: OP clear, mucus membranes moist  SKIN: warm, dry. No rashes. Neuro: No focal deficits  Musculoskeletal: Muscle strength 5/5 all ext  Psychiatric: Mood and affect normal  Neck: No JVD, no  carotid bruits, no thyromegaly, no lymphadenopathy.  Lungs:Clear bilaterally, no wheezes, rhonci, crackles Cardiovascular: Regular rate and rhythm. Soft systolic murmur.  Abdomen:Soft. Bowel sounds present. Non-tender.  Extremities: No lower extremity edema. Pulses are 2 + in the bilateral DP/PT.  Echo 02/18/17: Left ventricle: The cavity size was normal. Systolic function was normal. The estimated ejection fraction was in the range of 60% to 65%. Wall motion was normal; there were no regional wall motion abnormalities. Left ventricular diastolic function   parameters were normal. - Aortic valve: There was mild regurgitation. - Mitral valve: There was mild regurgitation. - Atrial septum: No defect or patent foramen ovale was identified.  EKG:  EKG is  ordered today. The ekg ordered today demonstrates  NSR, rate 72 bpm. Non-specific ST abnormality.   Recent Labs: 03/10/2018: ALT 16; BUN 5; Creatinine, Ser 0.69; Hemoglobin 14.0; Platelets 221.0; Potassium 4.1; Sodium 140   Lipid Panel    Component Value Date/Time   CHOL 170 03/10/2018 1043   TRIG 147.0 03/10/2018 1043   HDL 44.40 03/10/2018 1043   CHOLHDL 4 03/10/2018 1043   VLDL 29.4 03/10/2018 1043   LDLCALC 96 03/10/2018 1043   LDLDIRECT 149.3 07/28/2007 0000     Wt Readings from Last 3 Encounters:  04/20/18 161 lb (73 kg)  03/10/18 163 lb 9.6 oz (74.2 kg)  12/16/17 164 lb (74.4 kg)     Other studies Reviewed: Additional studies/ records that were reviewed today include: . Review of the above records demonstrates:    Assessment and Plan:   1. AORTIC VALVE INSUFFICIENCY: Mild by echo June 2018. Will plan to repeat echo in June 2021.     2. Mitral valve regurgitation: Mild by echo June 2018. Will repeat echo June 2021.   3. HTN: Her BP is well controlled. No changes.    Current medicines are reviewed at length with the patient today.  The patient does not have concerns regarding medicines.  The following changes have  been made:  no change  Labs/ tests ordered today include:   Orders Placed This Encounter  Procedures  . EKG 12-Lead    Disposition:   FU with me in 12  months  Signed, Lauree Chandler, MD 04/20/2018 2:58 PM    [address], [place], [place]  [address] Phone: [phone]; Fax: [phone]

## 2018-04-20 NOTE — Patient Instructions (Signed)

## 2018-04-24 ENCOUNTER — Encounter

## 2018-05-01 ENCOUNTER — Ambulatory Visit (INDEPENDENT_AMBULATORY_CARE_PROVIDER_SITE_OTHER): Payer: PPO

## 2018-05-01 DIAGNOSIS — E538 Deficiency of other specified B group vitamins: Secondary | ICD-10-CM

## 2018-05-01 MED ORDER — CYANOCOBALAMIN 1000 MCG/ML IJ SOLN
1000.0000 ug | Freq: Once | INTRAMUSCULAR | Status: AC
  Administered 2018-05-01: 1000 ug via INTRAMUSCULAR

## 2018-05-01 NOTE — Progress Notes (Signed)
Pre visit review using our clinic tool,if applicable. No additional management support is needed unless otherwise documented below in the visit note.   Pt here for monthly B12 injection per   B12 1053mcg given IM left deltoid, and pt tolerated injection well. No complaints voiced this visit.  Next B12 injection scheduled for May 29, 2018. Patient aware.

## 2018-05-05 DIAGNOSIS — Z1211 Encounter for screening for malignant neoplasm of colon: Secondary | ICD-10-CM | POA: Diagnosis not present

## 2018-05-05 DIAGNOSIS — Z1212 Encounter for screening for malignant neoplasm of rectum: Secondary | ICD-10-CM | POA: Diagnosis not present

## 2018-05-05 LAB — COLOGUARD: Cologuard: NEGATIVE

## 2018-05-14 ENCOUNTER — Encounter: Payer: Self-pay | Admitting: *Deleted

## 2018-05-14 ENCOUNTER — Telehealth: Payer: Self-pay | Admitting: *Deleted

## 2018-05-14 NOTE — Telephone Encounter (Signed)
Left message on machine to call back  

## 2018-05-14 NOTE — Telephone Encounter (Signed)
Patient returned call and given results.

## 2018-05-14 NOTE — Telephone Encounter (Signed)
Per Dr. Etter Sjogren cologuard results negative

## 2018-05-29 ENCOUNTER — Ambulatory Visit (INDEPENDENT_AMBULATORY_CARE_PROVIDER_SITE_OTHER): Payer: PPO

## 2018-05-29 DIAGNOSIS — E538 Deficiency of other specified B group vitamins: Secondary | ICD-10-CM

## 2018-05-29 MED ORDER — CYANOCOBALAMIN 1000 MCG/ML IJ SOLN
1000.0000 ug | Freq: Once | INTRAMUSCULAR | Status: AC
  Administered 2018-05-29: 1000 ug via INTRAMUSCULAR

## 2018-05-29 NOTE — Progress Notes (Signed)
Pre visit review using our clinic tool,if applicable. No additional management support is needed unless otherwise documented below in the visit note.   Pt here for monthly B12 injection per  Dr Roma Schanz.  B12 1060mcg given IM Right deltoid, and pt tolerated injection well.  No complaints voiced this visit.  Next B12 injection scheduled for 1 month.

## 2018-05-29 NOTE — Progress Notes (Signed)
Noted. Agree with above.  [place], DO 05/29/18 3:04 PM

## 2018-06-02 DIAGNOSIS — L820 Inflamed seborrheic keratosis: Secondary | ICD-10-CM | POA: Diagnosis not present

## 2018-06-26 ENCOUNTER — Ambulatory Visit: Payer: PPO

## 2018-06-29 ENCOUNTER — Ambulatory Visit (INDEPENDENT_AMBULATORY_CARE_PROVIDER_SITE_OTHER): Payer: PPO

## 2018-06-29 DIAGNOSIS — E538 Deficiency of other specified B group vitamins: Secondary | ICD-10-CM | POA: Diagnosis not present

## 2018-06-29 MED ORDER — CYANOCOBALAMIN 1000 MCG/ML IJ SOLN
1000.0000 ug | Freq: Once | INTRAMUSCULAR | Status: AC
  Administered 2018-06-29: 1000 ug via INTRAMUSCULAR

## 2018-06-29 NOTE — Progress Notes (Signed)
Pt. Here for monthly B12 injection per Dr. Etter Sjogren. B12 given in L deltoid, pt. Tolerated well. Next appointment made for 12/4 at 230PM.

## 2018-06-30 DIAGNOSIS — Z1231 Encounter for screening mammogram for malignant neoplasm of breast: Secondary | ICD-10-CM | POA: Diagnosis not present

## 2018-06-30 LAB — HM MAMMOGRAPHY

## 2018-07-03 ENCOUNTER — Encounter: Payer: Self-pay | Admitting: Family Medicine

## 2018-07-29 ENCOUNTER — Ambulatory Visit (INDEPENDENT_AMBULATORY_CARE_PROVIDER_SITE_OTHER): Payer: PPO

## 2018-07-29 DIAGNOSIS — E538 Deficiency of other specified B group vitamins: Secondary | ICD-10-CM

## 2018-07-29 MED ORDER — CYANOCOBALAMIN 1000 MCG/ML IJ SOLN
1000.0000 ug | Freq: Once | INTRAMUSCULAR | Status: AC
  Administered 2018-07-29: 1000 ug via INTRAMUSCULAR

## 2018-08-06 ENCOUNTER — Encounter: Payer: Self-pay | Admitting: Family Medicine

## 2018-08-28 ENCOUNTER — Ambulatory Visit: Payer: PPO

## 2018-08-28 ENCOUNTER — Ambulatory Visit: Payer: PPO | Admitting: Internal Medicine

## 2018-08-31 ENCOUNTER — Ambulatory Visit: Payer: PPO | Admitting: Family Medicine

## 2018-09-02 ENCOUNTER — Ambulatory Visit (INDEPENDENT_AMBULATORY_CARE_PROVIDER_SITE_OTHER): Payer: PPO

## 2018-09-02 DIAGNOSIS — E538 Deficiency of other specified B group vitamins: Secondary | ICD-10-CM | POA: Diagnosis not present

## 2018-09-02 MED ORDER — CYANOCOBALAMIN 1000 MCG/ML IJ SOLN
1000.0000 ug | Freq: Once | INTRAMUSCULAR | Status: AC
  Administered 2018-09-02: 1000 ug via INTRAMUSCULAR

## 2018-09-02 NOTE — Progress Notes (Signed)
Pre visit review using our clinic tool,if applicable. No additional management support is needed unless otherwise documented below in the visit note.   Pt here for monthly B12 injection per Dr. Lizbeth Bark.  B12 1026mcg given IM left deltoid and pt tolerated injection well.  Next B12 injection scheduled for 10/02/18 @ 2:15 pm

## 2018-09-13 ENCOUNTER — Other Ambulatory Visit: Payer: Self-pay | Admitting: Family Medicine

## 2018-09-13 DIAGNOSIS — I10 Essential (primary) hypertension: Secondary | ICD-10-CM

## 2018-10-02 ENCOUNTER — Ambulatory Visit: Payer: PPO

## 2018-10-06 ENCOUNTER — Ambulatory Visit (INDEPENDENT_AMBULATORY_CARE_PROVIDER_SITE_OTHER): Payer: PPO

## 2018-10-06 DIAGNOSIS — E538 Deficiency of other specified B group vitamins: Secondary | ICD-10-CM

## 2018-10-06 MED ORDER — CYANOCOBALAMIN 1000 MCG/ML IJ SOLN
1000.0000 ug | Freq: Once | INTRAMUSCULAR | Status: AC
  Administered 2018-10-06: 1000 ug via INTRAMUSCULAR

## 2018-10-06 NOTE — Progress Notes (Addendum)
Patient in today to have her monthly B-12 injection per Dr. Etter Sjogren. She tolerated 52mL in her left deltoid with no complications \\   [place], DO

## 2018-11-04 ENCOUNTER — Ambulatory Visit: Payer: PPO

## 2018-11-05 ENCOUNTER — Other Ambulatory Visit: Payer: Self-pay | Admitting: *Deleted

## 2018-11-05 ENCOUNTER — Ambulatory Visit (INDEPENDENT_AMBULATORY_CARE_PROVIDER_SITE_OTHER): Payer: PPO | Admitting: *Deleted

## 2018-11-05 ENCOUNTER — Other Ambulatory Visit: Payer: Self-pay

## 2018-11-05 DIAGNOSIS — E538 Deficiency of other specified B group vitamins: Secondary | ICD-10-CM

## 2018-11-05 MED ORDER — CYANOCOBALAMIN 1000 MCG/ML IJ SOLN
1000.0000 ug | Freq: Once | INTRAMUSCULAR | Status: AC
  Administered 2018-11-05: 1000 ug via INTRAMUSCULAR

## 2018-11-05 NOTE — Progress Notes (Signed)
Pt here for monthly B12 injection per Dr. Floy Sabina  B12 1048mcg given IM right deltoid and pt tolerated injection well.

## 2018-11-17 ENCOUNTER — Encounter: Payer: Self-pay | Admitting: Family Medicine

## 2018-11-18 NOTE — Telephone Encounter (Signed)
She can do SQ shots monthly ----- we will also have a tent outside set up shortly (I hope ) so nurse visit can be done there if she prefers.

## 2018-12-08 ENCOUNTER — Ambulatory Visit: Payer: PPO

## 2018-12-16 ENCOUNTER — Ambulatory Visit (INDEPENDENT_AMBULATORY_CARE_PROVIDER_SITE_OTHER): Payer: PPO

## 2018-12-16 ENCOUNTER — Other Ambulatory Visit: Payer: Self-pay

## 2018-12-16 DIAGNOSIS — E538 Deficiency of other specified B group vitamins: Secondary | ICD-10-CM | POA: Diagnosis not present

## 2018-12-16 MED ORDER — CYANOCOBALAMIN 1000 MCG/ML IJ SOLN
1000.0000 ug | Freq: Once | INTRAMUSCULAR | Status: AC
  Administered 2018-12-16: 14:00:00 1000 ug via INTRAMUSCULAR

## 2018-12-16 NOTE — Progress Notes (Addendum)
Pre visit review using our clinic tool,if applicable. No additional management support is needed unless otherwise documented below in the visit note.   Pt here for monthly B12 injection per order from Dr. Carollee Herter.  B12 1038mcg given IM right deltoid,  pt tolerated injection well. No complaints voiced.  Next B12 injection scheduled for 1 month.  Reviewed   Ann Held, DO

## 2019-01-15 ENCOUNTER — Ambulatory Visit: Payer: PPO

## 2019-01-19 ENCOUNTER — Ambulatory Visit (INDEPENDENT_AMBULATORY_CARE_PROVIDER_SITE_OTHER): Payer: PPO | Admitting: *Deleted

## 2019-01-19 ENCOUNTER — Other Ambulatory Visit: Payer: Self-pay

## 2019-01-19 DIAGNOSIS — E538 Deficiency of other specified B group vitamins: Secondary | ICD-10-CM

## 2019-01-19 MED ORDER — CYANOCOBALAMIN 1000 MCG/ML IJ SOLN
1000.0000 ug | Freq: Once | INTRAMUSCULAR | Status: AC
  Administered 2019-01-19: 1000 ug via INTRAMUSCULAR

## 2019-01-19 NOTE — Progress Notes (Signed)
Pt here for monthly B12 injection per order from Dr. Carollee Herter.  B12 1046mcg given IM left deltoid,  pt tolerated injection well. No complaints voiced.  Next B12 injection scheduled for 1 month

## 2019-02-23 ENCOUNTER — Other Ambulatory Visit: Payer: Self-pay

## 2019-02-23 ENCOUNTER — Ambulatory Visit (INDEPENDENT_AMBULATORY_CARE_PROVIDER_SITE_OTHER): Payer: PPO

## 2019-02-23 DIAGNOSIS — E538 Deficiency of other specified B group vitamins: Secondary | ICD-10-CM

## 2019-02-23 MED ORDER — CYANOCOBALAMIN 1000 MCG/ML IJ SOLN
1000.0000 ug | Freq: Once | INTRAMUSCULAR | Status: AC
  Administered 2019-02-23: 1000 ug via INTRAMUSCULAR

## 2019-02-23 NOTE — Progress Notes (Signed)
Pt here for monthly B12 injection per   B12 104mcg given IM in left deltoid, and pt tolerated injection well.  Next B12 injection scheduled for next month.

## 2019-02-23 NOTE — Progress Notes (Signed)
Noted   R Lowne Chase, DO  

## 2019-03-18 ENCOUNTER — Other Ambulatory Visit: Payer: Self-pay

## 2019-03-18 ENCOUNTER — Encounter: Payer: Self-pay | Admitting: Family Medicine

## 2019-03-18 ENCOUNTER — Ambulatory Visit (INDEPENDENT_AMBULATORY_CARE_PROVIDER_SITE_OTHER): Payer: PPO | Admitting: Family Medicine

## 2019-03-18 DIAGNOSIS — J0140 Acute pansinusitis, unspecified: Secondary | ICD-10-CM | POA: Diagnosis not present

## 2019-03-18 MED ORDER — LORATADINE 10 MG PO TABS: 10.0000 mg | ORAL_TABLET | Freq: Every day | ORAL | 11 refills | Status: DC

## 2019-03-18 MED ORDER — FLUTICASONE PROPIONATE 50 MCG/ACT NA SUSP: 2.0000 | Freq: Every day | NASAL | 6 refills | Status: DC

## 2019-03-18 MED ORDER — AMOXICILLIN-POT CLAVULANATE 875-125 MG PO TABS: 1.0000 | ORAL_TABLET | Freq: Two times a day (BID) | ORAL | 0 refills | Status: DC

## 2019-03-18 NOTE — Progress Notes (Deleted)
Patient ID: Dominique Gonzalez, female    DOB: 23-Jul-1951  Age: 68 y.o. MRN: [id number]    Subjective:  Subjective  HPI Galen Diniz presents for possible sinus infection , no fever  No cough ,  + sinus pressure around the eyes   Review of Systems  Constitutional: Positive for chills. Negative for fever.  HENT: Positive for congestion, postnasal drip, rhinorrhea, sinus pressure and sinus pain. Negative for sneezing and sore throat.   Respiratory: Negative for cough, chest tightness, shortness of breath and wheezing.   Cardiovascular: Negative for chest pain, palpitations and leg swelling.  Allergic/Immunologic: Negative for environmental allergies.    History Past Medical History:  Diagnosis Date  . Aortic insufficiency    mild  . B12 deficiency   . Cardiac murmur   . Diastolic dysfunction   . Fatigue   . Hip pain, right   . HTN (hypertension)   . Hyperlipidemia   . Leg pain, right   . Osteopenia   . Shoulder pain, left   . Sinusitis, acute    NOS  . URI (upper respiratory infection)     She has a past surgical history that includes Abdominal hysterectomy and Bilateral oophorectomy.   Her family history includes Breast cancer in her mother; Diabetes in her father; Hypertension in her father and mother.She reports that she quit smoking about 23 years ago. She has never used smokeless tobacco. She reports that she does not drink alcohol or use drugs.  Current Outpatient Medications on File Prior to Visit  Medication Sig Dispense Refill  . aspirin 81 MG tablet Take 81 mg by mouth daily.      Marland Kitchen lisinopril-hydrochlorothiazide (PRINZIDE,ZESTORETIC) 10-12.5 MG tablet TAKE 1 TABLET BY MOUTH EVERY DAY 90 tablet 1   No current facility-administered medications on file prior to visit.      Objective:  Objective  Physical Exam LMP  (LMP Unknown)  Wt Readings from Last 3 Encounters:  04/20/18 161 lb (73 kg)  03/10/18 163 lb 9.6 oz (74.2 kg)  12/16/17 164 lb (74.4 kg)      Lab Results  Component Value Date   WBC 5.5 03/10/2018   HGB 14.0 03/10/2018   HCT 40.7 03/10/2018   PLT 221.0 03/10/2018   GLUCOSE 92 03/10/2018   CHOL 170 03/10/2018   TRIG 147.0 03/10/2018   HDL 44.40 03/10/2018   LDLDIRECT 149.3 07/28/2007   LDLCALC 96 03/10/2018   ALT 16 03/10/2018   AST 20 03/10/2018   NA 140 03/10/2018   K 4.1 03/10/2018   CL 104 03/10/2018   CREATININE 0.69 03/10/2018   BUN 5 (L) 03/10/2018   CO2 30 03/10/2018   TSH 1.37 05/02/2015   HGBA1C 5.4 06/05/2016    Dg Knee Complete 4 Views Right  Result Date: 04/28/2016 CLINICAL DATA:  Stood from a seated position on her sofa this evening and heard her RIGHT leg pop, unable to bear weight since on RIGHT leg, having lateral RIGHT hip pain, anterior and posterior knee pain, intermittent RIGHT leg numbness, history hypertension, hyperlipidemia, initial encounter EXAM: RIGHT KNEE - COMPLETE 4+ VIEW COMPARISON:  None. FINDINGS: Osseous demineralization. Diffuse joint space narrowing with spur formation at medial compartment. No acute fracture, dislocation or bone destruction. No knee joint effusion. 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 No acute abnormalities. Electronically Signed   By: Lavonia Dana M.D.   On: 04/28/2016 21:23   Dg Hip Unilat With Pelvis 2-3 Views Right  Result Date: 04/28/2016 CLINICAL DATA:  Stood from a seated position on her sofa this evening and heard her RIGHT leg pop, unable to bear weight since on RIGHT leg, having lateral RIGHT hip pain, anterior and posterior knee pain, intermittent RIGHT leg numbness, history hypertension, hyperlipidemia, initial encounter EXAM: DG HIP (WITH OR WITHOUT PELVIS) 2-3V RIGHT COMPARISON:  None. FINDINGS: Diffuse osseous demineralization. Minimal narrowing of the hip joints. Pelvis intact. No acute fracture, dislocation or bone destruction. IMPRESSION: Osseous demineralization without acute bony abnormalities. Electronically Signed    By: Lavonia Dana M.D.   On: 04/28/2016 21:19   Dg Femur Min 2 Views Right  Result Date: 04/28/2016 CLINICAL DATA:  Stood from a seated position on her sofa this evening and heard her RIGHT leg pop, unable to bear weight since on RIGHT leg, having lateral RIGHT hip pain, anterior and posterior knee pain, intermittent RIGHT leg numbness, history hypertension, hyperlipidemia, initial encounter EXAM: RIGHT FEMUR 2 VIEWS COMPARISON:  None. FINDINGS: RIGHT knee and RIGHT hip radiographs reported separately. Osseous demineralization. No acute fracture, dislocation or bone destruction. IMPRESSION: No acute osseous abnormalities. Electronically Signed   By: Lavonia Dana M.D.   On: 04/28/2016 21:24     Assessment & Plan:  Plan  I am having Independence maintain her aspirin and lisinopril-hydrochlorothiazide.  No orders of the defined types were placed in this encounter.   Problem List Items Addressed This Visit    None      Follow-up: No follow-ups on file.  Ann Held, DO

## 2019-03-18 NOTE — Progress Notes (Signed)
Virtual Visit via Telephone Note  I connected with Dominique Gonzalez on 03/18/19 at  4:00 PM EDT by telephone and verified that I am speaking with the correct person using two identifiers.  Location: Patient: home Provider: office   I discussed the limitations, risks, security and privacy concerns of performing an hoeand management service by telephone and the availability of in person appointments. I also discussed with the patient that there may be a patient responsible charge related to this service. The patient expressed understanding and agreed to proceed.   History of Present Illness:   pt is home c/o sinus congestion / pressure x few weeks   She is taking advil and decongestant but it is elevating her bp and causing palpitations  Pt has no fever She has been in her house since covid started  Observations/Objective: . No vitals obtained  Pt in NAD  Assessment and Plan: 1. Acute non-recurrent pansinusitis abx and flonase sent to pharmacy Call if no better Consider covid testing -- pt did not want to do that now - fluticasone (FLONASE) 50 MCG/ACT nasal spray; Place 2 sprays into both nostrils daily.  Dispense: 16 g; Refill: 6 - amoxicillin-clavulanate (AUGMENTIN) 875-125 MG tablet; Take 1 tablet by mouth 2 (two) times daily.  Dispense: 20 tablet; Refill: 0 - loratadine (CLARITIN) 10 MG tablet; Take 1 tablet (10 mg total) by mouth daily.  Dispense: 30 tablet; Refill: 11   Follow Up Instructions:    I discussed the assessment and treatment plan with the patient. The patient was provided an opportunity to ask questions and all were answered. The patient agreed with the plan and demonstrated an understanding of the instructions.   The patient was advised to call back or seek an in-person evaluation if the symptoms worsen or if the condition fails to improve as anticipated.  I provided 15 minutes of non-face-to-face time during this encounter.   Ann Held, DO

## 2019-03-23 ENCOUNTER — Ambulatory Visit: Payer: PPO

## 2019-03-30 ENCOUNTER — Other Ambulatory Visit: Payer: Self-pay

## 2019-03-30 ENCOUNTER — Ambulatory Visit (INDEPENDENT_AMBULATORY_CARE_PROVIDER_SITE_OTHER): Payer: PPO | Admitting: *Deleted

## 2019-03-30 DIAGNOSIS — E538 Deficiency of other specified B group vitamins: Secondary | ICD-10-CM

## 2019-03-30 MED ORDER — CYANOCOBALAMIN 1000 MCG/ML IJ SOLN
1000.0000 ug | Freq: Once | INTRAMUSCULAR | Status: AC
  Administered 2019-03-30: 1000 ug via INTRAMUSCULAR

## 2019-03-30 NOTE — Progress Notes (Addendum)
Patient here today for monthly b12 injection per Dr. Etter Sjogren. Injection given and patient tolerated well.  Appointment scheduled for one month.   Reviewed Ann Held, DO

## 2019-04-30 ENCOUNTER — Ambulatory Visit (INDEPENDENT_AMBULATORY_CARE_PROVIDER_SITE_OTHER): Payer: PPO | Admitting: *Deleted

## 2019-04-30 ENCOUNTER — Other Ambulatory Visit: Payer: Self-pay

## 2019-04-30 DIAGNOSIS — E538 Deficiency of other specified B group vitamins: Secondary | ICD-10-CM

## 2019-04-30 MED ORDER — CYANOCOBALAMIN 1000 MCG/ML IJ SOLN
1000.0000 ug | Freq: Once | INTRAMUSCULAR | Status: AC
  Administered 2019-04-30: 1000 ug via INTRAMUSCULAR

## 2019-04-30 NOTE — Progress Notes (Signed)
Patient here today for monthly b12 injection per Dr. Etter Sjogren.   Injection given and patient tolerated well.

## 2019-06-03 ENCOUNTER — Other Ambulatory Visit: Payer: Self-pay

## 2019-06-03 ENCOUNTER — Encounter: Payer: Self-pay | Admitting: Cardiovascular Disease

## 2019-06-03 ENCOUNTER — Ambulatory Visit (INDEPENDENT_AMBULATORY_CARE_PROVIDER_SITE_OTHER): Payer: PPO | Admitting: Cardiovascular Disease

## 2019-06-03 VITALS — BP 120/78 | HR 79 | Ht 64.0 in | Wt 161.1 lb

## 2019-06-03 DIAGNOSIS — I10 Essential (primary) hypertension: Secondary | ICD-10-CM

## 2019-06-03 DIAGNOSIS — I351 Nonrheumatic aortic (valve) insufficiency: Secondary | ICD-10-CM | POA: Diagnosis not present

## 2019-06-03 DIAGNOSIS — I340 Nonrheumatic mitral (valve) insufficiency: Secondary | ICD-10-CM

## 2019-06-03 NOTE — Progress Notes (Signed)
Chief Complaint  Patient presents with  . Follow-up    aortic valve disease    History of Present Illness: 68 yo female with history of aortic valve insufficiency, mitral regurgitation, HTN here today for follow up. She has been followed in my office since 2011 when she was found to have mild aortic valve insufficiency. Echo in 2011 showed normal LV systolic function, diastolic dysfunction, mild AI, small effusion and possible epicardial mass/aortic root mass. I ordered a cardiac MRI which showed prominent epicardial fat pad with no evidence of aortic or pericardial tumor or mass. Most recent echo June 2018 with normal LV function, mild AI, mild MR.   She is here today for follow up. The patient denies any chest pain, dyspnea, palpitations, lower extremity edema, orthopnea, PND, dizziness, near syncope or syncope.   Primary Care Physician:  Carollee Herter, Alferd Apa, DO  Past Medical History:  Diagnosis Date  . Aortic insufficiency    mild  . B12 deficiency   . Cardiac murmur   . Diastolic dysfunction   . Fatigue   . Hip pain, right   . HTN (hypertension)   . Hyperlipidemia   . Leg pain, right   . Osteopenia   . Shoulder pain, left   . Sinusitis, acute    NOS  . URI (upper respiratory infection)     Past Surgical History:  Procedure Laterality Date  . ABDOMINAL HYSTERECTOMY     age 48  . BILATERAL OOPHORECTOMY      Current Outpatient Medications  Medication Sig Dispense Refill  . aspirin 81 MG tablet Take 81 mg by mouth daily.      . fluticasone (FLONASE) 50 MCG/ACT nasal spray Place 2 sprays into both nostrils daily. 16 g 6  . lisinopril-hydrochlorothiazide (PRINZIDE,ZESTORETIC) 10-12.5 MG tablet TAKE 1 TABLET BY MOUTH EVERY DAY 90 tablet 1  . loratadine (CLARITIN) 10 MG tablet Take 1 tablet (10 mg total) by mouth daily. 30 tablet 11   No current facility-administered medications for this visit.     Allergies  Allergen Reactions  . Macrobid [Nitrofurantoin] Nausea  And Vomiting    vomiting  . Bactrim [Sulfamethoxazole-Trimethoprim] Hives    Social History   Socioeconomic History  . Marital status: Married    Spouse name: Not on file  . Number of children: 2  . Years of education: Not on file  . Highest education level: Not on file  Occupational History  . Not on file  Social Needs  . Financial resource strain: Not on file  . Food insecurity    Worry: Not on file    Inability: Not on file  . Transportation needs    Medical: Not on file    Non-medical: Not on file  Tobacco Use  . Smoking status: Former Smoker    Quit date: 01/10/1996    Years since quitting: 23.4  . Smokeless tobacco: Never Used  . Tobacco comment: smoked for 10 years,   Substance and Sexual Activity  . Alcohol use: No  . Drug use: No  . Sexual activity: Not on file  Lifestyle  . Physical activity    Days per week: Not on file    Minutes per session: Not on file  . Stress: Not on file  Relationships  . Social Herbalist on phone: Not on file    Gets together: Not on file    Attends religious service: Not on file    Active member of club or  organization: Not on file    Attends meetings of clubs or organizations: Not on file    Relationship status: Not on file  . Intimate partner violence    Fear of current or ex partner: Not on file    Emotionally abused: Not on file    Physically abused: Not on file    Forced sexual activity: Not on file  Other Topics Concern  . Not on file  Social History Narrative  . Not on file    Family History  Problem Relation Age of Onset  . Hypertension Father   . Diabetes Father   . Hypertension Mother   . Breast cancer Mother     Review of Systems:  As stated in the HPI and otherwise negative.   BP 120/78   Pulse 79   Ht 5\' 4"  (1.626 m)   Wt 161 lb 1.9 oz (73.1 kg)   LMP  (LMP Unknown)   SpO2 98%   BMI 27.66 kg/m   Physical Examination:  General: Well developed, well nourished, NAD  HEENT: OP clear,  mucus membranes moist  SKIN: warm, dry. No rashes. Neuro: No focal deficits  Musculoskeletal: Muscle strength 5/5 all ext  Psychiatric: Mood and affect normal  Neck: No JVD, no carotid bruits, no thyromegaly, no lymphadenopathy.  Lungs:Clear bilaterally, no wheezes, rhonci, crackles Cardiovascular: Regular rate and rhythm. No murmurs, gallops or rubs. Abdomen:Soft. Bowel sounds present. Non-tender.  Extremities: No lower extremity edema. Pulses are 2 + in the bilateral DP/PT.  Echo 02/18/17: Left ventricle: The cavity size was normal. Systolic function was normal. The estimated ejection fraction was in the range of 60% to 65%. Wall motion was normal; there were no regional wall motion abnormalities. Left ventricular diastolic function   parameters were normal. - Aortic valve: There was mild regurgitation. - Mitral valve: There was mild regurgitation. - Atrial septum: No defect or patent foramen ovale was identified.  EKG:  EKG is  ordered today. The ekg ordered today demonstrates NSR, rate 79 bpm. Non-specific ST and T wave abnormality.  Recent Labs: No results found for requested labs within last 8760 hours.   Lipid Panel    Component Value Date/Time   CHOL 170 03/10/2018 1043   TRIG 147.0 03/10/2018 1043   HDL 44.40 03/10/2018 1043   CHOLHDL 4 03/10/2018 1043   VLDL 29.4 03/10/2018 1043   LDLCALC 96 03/10/2018 1043   LDLDIRECT 149.3 07/28/2007 0000     Wt Readings from Last 3 Encounters:  06/03/19 161 lb 1.9 oz (73.1 kg)  04/20/18 161 lb (73 kg)  03/10/18 163 lb 9.6 oz (74.2 kg)     Other studies Reviewed: Additional studies/ records that were reviewed today include: . Review of the above records demonstrates:    Assessment and Plan:   1. AORTIC VALVE INSUFFICIENCY: Mild by echo June 2018. Repeat echo June 2021   2. Mitral valve regurgitation: Mild by echo June 2018. Will repeat echo June 2021.   3. HTN: BP is well controlled.    Current medicines are reviewed  at length with the patient today.  The patient does not have concerns regarding medicines.  The following changes have been made:  no change  Labs/ tests ordered today include:   Orders Placed This Encounter  Procedures  . EKG 12-Lead  . ECHOCARDIOGRAM COMPLETE    Disposition:   FU with me in 12  months  Signed, Lauree Chandler, MD 06/03/2019 4:39 PM    [facility]  [address], [place], [place]  [address] Phone: [phone]; Fax: [phone]

## 2019-06-03 NOTE — Patient Instructions (Signed)
Medication Instructions:  No changes If you need a refill on your cardiac medications before your next appointment, please call your pharmacy.   Lab work: none If you have labs (blood work) drawn today and your tests are completely normal, you will receive your results only by: Marland Kitchen MyChart Message (if you have MyChart) OR . A paper copy in the mail If you have any lab test that is abnormal or we need to change your treatment, we will call you to review the results.  Testing/Procedures: Your physician has requested that you have an echocardiogram. Echocardiography is a painless test that uses sound waves to create images of your heart. It provides your doctor with information about the size and shape of your heart and how well your heart's chambers and valves are working. This procedure takes approximately one hour. There are no restrictions for this procedure.  THIS IS DUE June 2021.  Follow-Up: At [facility], you and your health needs are our priority.  As part of our continuing mission to provide you with exceptional heart care, we have created designated Provider Care Teams.  These Care Teams include your primary Cardiologist (physician) and Advanced Practice Providers (APPs -  Physician Assistants and Nurse Practitioners) who all work together to provide you with the care you need, when you need it. You will need a follow up appointment in 12 months.  Please call our office 2 months in advance to schedule this appointment.  You may see Lauree Chandler, MD or one of the following Advanced Practice Providers on your designated Care Team:   [place], PA-C Melina Copa, PA-C . Ermalinda Barrios, PA-C  Any Other Special Instructions Will Be Listed Below (If Applicable).

## 2019-06-07 DIAGNOSIS — L821 Other seborrheic keratosis: Secondary | ICD-10-CM | POA: Diagnosis not present

## 2019-06-10 ENCOUNTER — Ambulatory Visit: Payer: PPO

## 2019-06-17 DIAGNOSIS — L821 Other seborrheic keratosis: Secondary | ICD-10-CM | POA: Diagnosis not present

## 2019-06-17 DIAGNOSIS — D485 Neoplasm of uncertain behavior of skin: Secondary | ICD-10-CM | POA: Diagnosis not present

## 2019-06-18 ENCOUNTER — Ambulatory Visit (INDEPENDENT_AMBULATORY_CARE_PROVIDER_SITE_OTHER): Payer: PPO | Admitting: *Deleted

## 2019-06-18 ENCOUNTER — Other Ambulatory Visit: Payer: Self-pay

## 2019-06-18 DIAGNOSIS — E538 Deficiency of other specified B group vitamins: Secondary | ICD-10-CM

## 2019-06-18 MED ORDER — CYANOCOBALAMIN 1000 MCG/ML IJ SOLN
1000.0000 ug | Freq: Once | INTRAMUSCULAR | Status: AC
  Administered 2019-06-18: 11:00:00 1000 ug via INTRAMUSCULAR

## 2019-06-18 NOTE — Progress Notes (Signed)
Patient here today for monthly b12 injection per Dr. Etter Sjogren.   Injection given in right deltoid and patient tolerated well.  Patient past due for follow up and b12 level check.  Appointment scheduled for next month and at that visit she will get next b12 injection.

## 2019-07-19 ENCOUNTER — Encounter: Payer: Self-pay | Admitting: Family Medicine

## 2019-07-19 ENCOUNTER — Ambulatory Visit (INDEPENDENT_AMBULATORY_CARE_PROVIDER_SITE_OTHER): Payer: PPO | Admitting: Family Medicine

## 2019-07-19 ENCOUNTER — Other Ambulatory Visit: Payer: Self-pay

## 2019-07-19 VITALS — BP 122/66 | HR 85 | Temp 97.4°F | Resp 16 | Ht 64.0 in | Wt 164.0 lb

## 2019-07-19 DIAGNOSIS — E538 Deficiency of other specified B group vitamins: Secondary | ICD-10-CM | POA: Diagnosis not present

## 2019-07-19 DIAGNOSIS — I10 Essential (primary) hypertension: Secondary | ICD-10-CM | POA: Diagnosis not present

## 2019-07-19 DIAGNOSIS — E785 Hyperlipidemia, unspecified: Secondary | ICD-10-CM | POA: Diagnosis not present

## 2019-07-19 LAB — LIPID PANEL
Cholesterol: 187 mg/dL (ref 0–200)
HDL: 43.5 mg/dL (ref >39.00)
LDL Cholesterol: 107 mg/dL — ABNORMAL HIGH (ref 0–99)
NonHDL: 143.42
Total CHOL/HDL Ratio: 4
Triglycerides: 184 mg/dL — ABNORMAL HIGH (ref 0.0–149.0)
VLDL: 36.8 mg/dL (ref 0.0–40.0)

## 2019-07-19 LAB — COMPREHENSIVE METABOLIC PANEL
ALT: 14 U/L (ref 0–35)
AST: 20 U/L (ref 0–37)
Albumin: 3.9 g/dL (ref 3.5–5.2)
Alkaline Phosphatase: 88 U/L (ref 39–117)
BUN: 10 mg/dL (ref 6–23)
CO2: 27 mEq/L (ref 19–32)
Calcium: 9 mg/dL (ref 8.4–10.5)
Chloride: 103 mEq/L (ref 96–112)
Creatinine, Ser: 0.7 mg/dL (ref 0.40–1.20)
GFR: 83.47 mL/min (ref >60.00)
Glucose, Bld: 107 mg/dL — ABNORMAL HIGH (ref 70–99)
Potassium: 3.7 mEq/L (ref 3.5–5.1)
Sodium: 139 mEq/L (ref 135–145)
Total Bilirubin: 0.6 mg/dL (ref 0.2–1.2)
Total Protein: 6.7 g/dL (ref 6.0–8.3)

## 2019-07-19 LAB — CBC WITH DIFFERENTIAL/PLATELET
Basophils Absolute: 0.1 10*3/uL (ref 0.0–0.1)
Basophils Relative: 1.2 % (ref 0.0–3.0)
Eosinophils Absolute: 0.2 10*3/uL (ref 0.0–0.7)
Eosinophils Relative: 2.5 % (ref 0.0–5.0)
HCT: 41.1 % (ref 36.0–46.0)
Hemoglobin: 14.1 g/dL (ref 12.0–15.0)
Lymphocytes Relative: 33.7 % (ref 12.0–46.0)
Lymphs Abs: 2.3 10*3/uL (ref 0.7–4.0)
MCHC: 34.2 g/dL (ref 30.0–36.0)
MCV: 89 fl (ref 78.0–100.0)
Monocytes Absolute: 0.5 10*3/uL (ref 0.1–1.0)
Monocytes Relative: 7 % (ref 3.0–12.0)
Neutro Abs: 3.8 10*3/uL (ref 1.4–7.7)
Neutrophils Relative %: 55.6 % (ref 43.0–77.0)
Platelets: 221 10*3/uL (ref 150.0–400.0)
RBC: 4.63 Mil/uL (ref 3.87–5.11)
RDW: 13.3 % (ref 11.5–15.5)
WBC: 6.9 10*3/uL (ref 4.0–10.5)

## 2019-07-19 LAB — VITAMIN B12: Vitamin B-12: >1500 pg/mL — ABNORMAL HIGH (ref 211–911)

## 2019-07-19 MED ORDER — LISINOPRIL-HYDROCHLOROTHIAZIDE 10-12.5 MG PO TABS: 1.0000 | ORAL_TABLET | Freq: Every day | ORAL | 1 refills | Status: DC

## 2019-07-19 MED ORDER — CYANOCOBALAMIN 1000 MCG/ML IJ SOLN
1000.0000 ug | Freq: Once | INTRAMUSCULAR | Status: AC
  Administered 2019-07-19: 14:00:00 1000 ug via INTRAMUSCULAR

## 2019-07-19 NOTE — Assessment & Plan Note (Signed)
Check labs  con't monthly shots

## 2019-07-19 NOTE — Patient Instructions (Signed)

## 2019-07-19 NOTE — Assessment & Plan Note (Signed)
Well controlled, no changes to meds. Encouraged heart healthy diet such as the DASH diet and exercise as tolerated.  °

## 2019-07-19 NOTE — Assessment & Plan Note (Signed)
Encouraged heart healthy diet, increase exercise, avoid trans fats, consider a krill oil cap daily 

## 2019-07-19 NOTE — Progress Notes (Signed)
Patient ID: Dominique Gonzalez, female    DOB: October 14, 1950  Age: 68 y.o. MRN: [id number]    Subjective:  Subjective  HPI Dominique Gonzalez presents for f/u bp, b12 and cholesterol    No complaints  Review of Systems  Constitutional: Negative for appetite change, diaphoresis, fatigue and unexpected weight change.  Eyes: Negative for pain, redness and visual disturbance.  Respiratory: Negative for cough, chest tightness, shortness of breath and wheezing.   Cardiovascular: Negative for chest pain, palpitations and leg swelling.  Endocrine: Negative for cold intolerance, heat intolerance, polydipsia, polyphagia and polyuria.  Genitourinary: Negative for difficulty urinating, dysuria and frequency.  Neurological: Negative for dizziness, light-headedness, numbness and headaches.    History Past Medical History:  Diagnosis Date   Aortic insufficiency    mild   B12 deficiency    Cardiac murmur    Diastolic dysfunction    Fatigue    Hip pain, right    HTN (hypertension)    Hyperlipidemia    Leg pain, right    Osteopenia    Shoulder pain, left    Sinusitis, acute    NOS   URI (upper respiratory infection)     She has a past surgical history that includes Abdominal hysterectomy and Bilateral oophorectomy.   Her family history includes Breast cancer in her mother; Diabetes in her father; Hypertension in her father and mother.She reports that she quit smoking about 23 years ago. She has never used smokeless tobacco. She reports that she does not drink alcohol or use drugs.  Current Outpatient Medications on File Prior to Visit  Medication Sig Dispense Refill   aspirin 81 MG tablet Take 81 mg by mouth daily.       No current facility-administered medications on file prior to visit.      Objective:  Objective  Physical Exam Vitals signs and nursing note reviewed.  Constitutional:      Appearance: She is well-developed.  HENT:     Head: Normocephalic and atraumatic.    Eyes:     Conjunctiva/sclera: Conjunctivae normal.  Neck:     Musculoskeletal: Normal range of motion and neck supple.     Thyroid: No thyromegaly.     Vascular: No carotid bruit or JVD.  Cardiovascular:     Rate and Rhythm: Normal rate and regular rhythm.     Heart sounds: Normal heart sounds. No murmur.  Pulmonary:     Effort: Pulmonary effort is normal. No respiratory distress.     Breath sounds: Normal breath sounds. No wheezing or rales.  Chest:     Chest wall: No tenderness.  Neurological:     Mental Status: She is alert and oriented to person, place, and time.    BP 122/66 (BP Location: Left Arm, Patient Position: Sitting, Cuff Size: Small)    Pulse 85    Temp (!) 97.4 F (36.3 C) (Temporal)    Resp 16    Ht 5\' 4"  (1.626 m)    Wt 164 lb (74.4 kg)    LMP  (LMP Unknown)    SpO2 99%    BMI 28.15 kg/m  Wt Readings from Last 3 Encounters:  07/19/19 164 lb (74.4 kg)  06/03/19 161 lb 1.9 oz (73.1 kg)  04/20/18 161 lb (73 kg)     Lab Results  Component Value Date   WBC 5.5 03/10/2018   HGB 14.0 03/10/2018   HCT 40.7 03/10/2018   PLT 221.0 03/10/2018   GLUCOSE 92 03/10/2018   CHOL 170 03/10/2018  TRIG 147.0 03/10/2018   HDL 44.40 03/10/2018   LDLDIRECT 149.3 07/28/2007   LDLCALC 96 03/10/2018   ALT 16 03/10/2018   AST 20 03/10/2018   NA 140 03/10/2018   K 4.1 03/10/2018   CL 104 03/10/2018   CREATININE 0.69 03/10/2018   BUN 5 (L) 03/10/2018   CO2 30 03/10/2018   TSH 1.37 05/02/2015   HGBA1C 5.4 06/05/2016    Dg Knee Complete 4 Views Right  Result Date: 04/28/2016 CLINICAL DATA:  Stood from a seated position on her sofa this evening and heard her RIGHT leg pop, unable to bear weight since on RIGHT leg, having lateral RIGHT hip pain, anterior and posterior knee pain, intermittent RIGHT leg numbness, history hypertension, hyperlipidemia, initial encounter EXAM: RIGHT KNEE - COMPLETE 4+ VIEW COMPARISON:  None. FINDINGS: Osseous demineralization. Diffuse joint space  narrowing with spur formation at medial compartment. No acute fracture, dislocation or bone destruction. No knee joint effusion. 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 No acute abnormalities. Electronically Signed   By: Lavonia Dana M.D.   On: 04/28/2016 21:23   Dg Hip Unilat With Pelvis 2-3 Views Right  Result Date: 04/28/2016 CLINICAL DATA:  Stood from a seated position on her sofa this evening and heard her RIGHT leg pop, unable to bear weight since on RIGHT leg, having lateral RIGHT hip pain, anterior and posterior knee pain, intermittent RIGHT leg numbness, history hypertension, hyperlipidemia, initial encounter EXAM: DG HIP (WITH OR WITHOUT PELVIS) 2-3V RIGHT COMPARISON:  None. FINDINGS: Diffuse osseous demineralization. Minimal narrowing of the hip joints. Pelvis intact. No acute fracture, dislocation or bone destruction. IMPRESSION: Osseous demineralization without acute bony abnormalities. Electronically Signed   By: Lavonia Dana M.D.   On: 04/28/2016 21:19   Dg Femur Min 2 Views Right  Result Date: 04/28/2016 CLINICAL DATA:  Stood from a seated position on her sofa this evening and heard her RIGHT leg pop, unable to bear weight since on RIGHT leg, having lateral RIGHT hip pain, anterior and posterior knee pain, intermittent RIGHT leg numbness, history hypertension, hyperlipidemia, initial encounter EXAM: RIGHT FEMUR 2 VIEWS COMPARISON:  None. FINDINGS: RIGHT knee and RIGHT hip radiographs reported separately. Osseous demineralization. No acute fracture, dislocation or bone destruction. IMPRESSION: No acute osseous abnormalities. Electronically Signed   By: Lavonia Dana M.D.   On: 04/28/2016 21:24     Assessment & Plan:  Plan  I have discontinued Secret Verge's fluticasone and loratadine. I have also changed her lisinopril-hydrochlorothiazide. Additionally, I am having her maintain her aspirin. We administered cyanocobalamin.  Meds ordered this encounter    Medications   cyanocobalamin ((VITAMIN B-12)) injection 1,000 mcg   lisinopril-hydrochlorothiazide (ZESTORETIC) 10-12.5 MG tablet    Sig: Take 1 tablet by mouth daily.    Dispense:  90 tablet    Refill:  1    Problem List Items Addressed This Visit      Unprioritized   B12 deficiency - Primary    Check labs  con't monthly shots      Relevant Orders   Vitamin B12   CBC with Differential   Essential hypertension    Well controlled, no changes to meds. Encouraged heart healthy diet such as the DASH diet and exercise as tolerated.        Relevant Medications   lisinopril-hydrochlorothiazide (ZESTORETIC) 10-12.5 MG tablet   Other Relevant Orders   Vitamin B12   Lipid panel   Comprehensive metabolic panel   CBC with Differential   Hyperlipidemia  Encouraged heart healthy diet, increase exercise, avoid trans fats, consider a krill oil cap daily      Relevant Medications   lisinopril-hydrochlorothiazide (ZESTORETIC) 10-12.5 MG tablet   Other Relevant Orders   Lipid panel   Comprehensive metabolic panel      Follow-up: Return in about 6 months (around 01/16/2020), or if symptoms worsen or fail to improve, for annual exam, fasting.  Ann Held, DO

## 2019-08-18 ENCOUNTER — Ambulatory Visit: Payer: PPO

## 2019-08-24 ENCOUNTER — Ambulatory Visit: Payer: PPO

## 2019-09-01 ENCOUNTER — Telehealth: Payer: Self-pay | Admitting: Family Medicine

## 2019-09-01 ENCOUNTER — Encounter: Payer: Self-pay | Admitting: Family Medicine

## 2019-09-01 NOTE — Telephone Encounter (Signed)
You have not seen patient for breast pain. Okay to change order? Please advise

## 2019-09-01 NOTE — Telephone Encounter (Signed)
It doesn't look like she had the mammogram today. Dr. Etter Sjogren would like a office visit or at least a virtual please.

## 2019-09-01 NOTE — Telephone Encounter (Signed)
[facility] called stating pt at imaging center and they need new mammogram order for diagnostic mammogram due to her having breast pain. [facility] tried to transfer but pt was not on the line. Please advise.

## 2019-09-01 NOTE — Telephone Encounter (Signed)
Can she come in tomorrow or at least VV

## 2019-09-02 ENCOUNTER — Other Ambulatory Visit: Payer: Self-pay

## 2019-09-02 ENCOUNTER — Other Ambulatory Visit: Payer: Self-pay | Admitting: Family Medicine

## 2019-09-02 ENCOUNTER — Encounter: Payer: Self-pay | Admitting: Family Medicine

## 2019-09-02 ENCOUNTER — Ambulatory Visit (INDEPENDENT_AMBULATORY_CARE_PROVIDER_SITE_OTHER): Payer: PPO | Admitting: Family Medicine

## 2019-09-02 VITALS — Temp 97.8°F | Ht 64.0 in

## 2019-09-02 DIAGNOSIS — N644 Mastodynia: Secondary | ICD-10-CM

## 2019-09-02 MED ORDER — CYCLOBENZAPRINE HCL 5 MG PO TABS: 5.0000 mg | ORAL_TABLET | Freq: Three times a day (TID) | ORAL | 1 refills | Status: DC | PRN

## 2019-09-02 NOTE — Telephone Encounter (Signed)
done

## 2019-09-02 NOTE — Progress Notes (Signed)
Virtual Visit via Telephone Note  I connected with Dominique Gonzalez on 09/02/19 at 11:20 AM EST by telephone and verified that I am speaking with the correct person using two identifiers.  Location: Patient: home alone  Provider: office    I discussed the limitations, risks, security and privacy concerns of performing an evaluation and management service by telephone and the availability of in person appointments. I also discussed with the patient that there may be a patient responsible charge related to this service. The patient expressed understanding and agreed to proceed.   History of Present Illness: Pt is home and she went to get her mammogram and because she was having L breast pain they told her she had to come here first   she discussed this with her cardiologist-- she was told it was not her heart Observations/Objective: Vitals:   09/02/19 1139  Temp: 97.8 F (36.6 C)   Pt in NAD  No chest pain Assessment and Plan: 1. Breast pain, left I suspect its MS--- pt states IB helps pain She will try tylenol/ heat Pt does not want rx med  Pain is not cardiac per cards -  Cardiology note reviewed  - MM Digital Diagnostic Bilat; Future - US BREAST COMPLETE UNI LEFT INC AXILLA; Future   Follow Up Instructions:    I discussed the assessment and treatment plan with the patient. The patient was provided an opportunity to ask questions and all were answered. The patient agreed with the plan and demonstrated an understanding of the instructions.   The patient was advised to call back or seek an in-person evaluation if the symptoms worsen or if the condition fails to improve as anticipated.     Ann Held, DO

## 2019-09-03 ENCOUNTER — Ambulatory Visit (INDEPENDENT_AMBULATORY_CARE_PROVIDER_SITE_OTHER): Payer: PPO

## 2019-09-03 ENCOUNTER — Other Ambulatory Visit: Payer: Self-pay

## 2019-09-03 DIAGNOSIS — E538 Deficiency of other specified B group vitamins: Secondary | ICD-10-CM | POA: Diagnosis not present

## 2019-09-03 MED ORDER — CYANOCOBALAMIN 1000 MCG/ML IJ SOLN
1000.0000 ug | Freq: Once | INTRAMUSCULAR | Status: AC
  Administered 2019-09-03: 12:00:00 1000 ug via INTRAMUSCULAR

## 2019-09-03 NOTE — Progress Notes (Signed)
Pt here for monthly B12 injection per  Dr. Paz  B12 1000mcg given IM left deltoid, and pt tolerated injection well.  Next B12 injection scheduled for next month.   

## 2019-09-03 NOTE — Progress Notes (Signed)
Noted  Adolpho Meenach R Lowne Chase, DO  

## 2019-09-09 ENCOUNTER — Telehealth: Payer: Self-pay | Admitting: *Deleted

## 2019-09-09 NOTE — Telephone Encounter (Signed)
[place] FROM 09/07/19-09/06/20  KEY: Dominique Gonzalez

## 2019-09-16 DIAGNOSIS — R928 Other abnormal and inconclusive findings on diagnostic imaging of breast: Secondary | ICD-10-CM | POA: Diagnosis not present

## 2019-09-16 LAB — HM MAMMOGRAPHY

## 2019-09-24 ENCOUNTER — Other Ambulatory Visit: Payer: Self-pay

## 2019-09-24 DIAGNOSIS — N644 Mastodynia: Secondary | ICD-10-CM

## 2019-10-06 ENCOUNTER — Encounter: Payer: Self-pay | Admitting: Family Medicine

## 2019-10-06 ENCOUNTER — Ambulatory Visit: Payer: PPO

## 2019-10-14 ENCOUNTER — Ambulatory Visit: Payer: PPO

## 2019-10-20 ENCOUNTER — Ambulatory Visit: Payer: PPO

## 2019-10-21 ENCOUNTER — Ambulatory Visit: Payer: PPO

## 2019-11-02 ENCOUNTER — Other Ambulatory Visit: Payer: Self-pay

## 2019-11-02 ENCOUNTER — Ambulatory Visit (INDEPENDENT_AMBULATORY_CARE_PROVIDER_SITE_OTHER): Payer: PPO

## 2019-11-02 DIAGNOSIS — E538 Deficiency of other specified B group vitamins: Secondary | ICD-10-CM | POA: Diagnosis not present

## 2019-11-02 MED ORDER — CYANOCOBALAMIN 1000 MCG/ML IJ SOLN
1000.0000 ug | Freq: Once | INTRAMUSCULAR | Status: AC
  Administered 2019-11-02: 1000 ug via INTRAMUSCULAR

## 2019-11-02 NOTE — Progress Notes (Signed)
Pt here for monthly B12 injection per Dr. Lowne  B12 1000mcg given IM, and pt tolerated injection well.  Next B12 injection scheduled for next month.    

## 2019-11-02 NOTE — Progress Notes (Signed)
Reviewed  Ginni Eichler R Lowne Chase, DO  

## 2019-11-02 NOTE — Progress Notes (Signed)
Reviewed  Oluwatoni Rotunno R Lowne Chase, DO  

## 2019-12-07 ENCOUNTER — Ambulatory Visit: Payer: PPO

## 2019-12-08 ENCOUNTER — Ambulatory Visit: Payer: PPO

## 2019-12-09 ENCOUNTER — Ambulatory Visit: Payer: PPO

## 2019-12-16 ENCOUNTER — Ambulatory Visit (INDEPENDENT_AMBULATORY_CARE_PROVIDER_SITE_OTHER): Payer: PPO

## 2019-12-16 ENCOUNTER — Other Ambulatory Visit: Payer: Self-pay

## 2019-12-16 DIAGNOSIS — E538 Deficiency of other specified B group vitamins: Secondary | ICD-10-CM | POA: Diagnosis not present

## 2019-12-16 MED ORDER — CYANOCOBALAMIN 1000 MCG/ML IJ SOLN: 1000.0000 ug | Freq: Once | INTRAMUSCULAR | 0 refills | Status: AC

## 2019-12-16 MED ORDER — CYANOCOBALAMIN 1000 MCG/ML IJ SOLN
1000.0000 ug | Freq: Once | INTRAMUSCULAR | Status: AC
  Administered 2019-12-16: 1000 ug via INTRAMUSCULAR

## 2019-12-16 NOTE — Progress Notes (Addendum)
Pt here for monthly B12 injection per  Dr. Etter Sjogren  B12 1036mcg given IM left deltoid , and pt tolerated injection well.  Next B12 injection scheduled for next month.    Reviewed Ann Held, DO

## 2019-12-16 NOTE — Addendum Note (Signed)
Addended by: Jeronimo Greaves on: 12/16/2019 02:16 PM   Modules accepted: Orders

## 2019-12-18 ENCOUNTER — Other Ambulatory Visit: Payer: Self-pay | Admitting: Family Medicine

## 2019-12-18 DIAGNOSIS — I10 Essential (primary) hypertension: Secondary | ICD-10-CM

## 2020-01-06 ENCOUNTER — Encounter: Payer: Self-pay | Admitting: Family Medicine

## 2020-01-06 ENCOUNTER — Ambulatory Visit: Payer: PPO

## 2020-01-31 ENCOUNTER — Ambulatory Visit (HOSPITAL_COMMUNITY): Payer: PPO | Attending: Cardiovascular Disease

## 2020-01-31 ENCOUNTER — Other Ambulatory Visit: Payer: Self-pay

## 2020-01-31 DIAGNOSIS — I340 Nonrheumatic mitral (valve) insufficiency: Secondary | ICD-10-CM | POA: Insufficient documentation

## 2020-01-31 DIAGNOSIS — I10 Essential (primary) hypertension: Secondary | ICD-10-CM

## 2020-01-31 DIAGNOSIS — I351 Nonrheumatic aortic (valve) insufficiency: Secondary | ICD-10-CM | POA: Diagnosis not present

## 2020-02-25 ENCOUNTER — Ambulatory Visit: Payer: PPO

## 2020-03-02 ENCOUNTER — Ambulatory Visit: Payer: PPO

## 2020-03-03 ENCOUNTER — Ambulatory Visit: Payer: PPO

## 2020-03-10 ENCOUNTER — Ambulatory Visit (INDEPENDENT_AMBULATORY_CARE_PROVIDER_SITE_OTHER): Payer: PPO

## 2020-03-10 ENCOUNTER — Other Ambulatory Visit: Payer: Self-pay

## 2020-03-10 DIAGNOSIS — E538 Deficiency of other specified B group vitamins: Secondary | ICD-10-CM | POA: Diagnosis not present

## 2020-03-10 MED ORDER — CYANOCOBALAMIN 1000 MCG/ML IJ SOLN
1000.0000 ug | Freq: Once | INTRAMUSCULAR | Status: AC
  Administered 2020-03-10: 1000 ug via INTRAMUSCULAR

## 2020-03-10 NOTE — Progress Notes (Signed)
Reviewed  Siddhanth Denk R Lowne Chase, DO  

## 2020-03-10 NOTE — Progress Notes (Signed)
Pt here for monthly B12 injection per  Dr. Etter Sjogren  B12 109mcg given IM in right deltoid, and pt tolerated injection well.  Next B12 injection scheduled for next month 03/14/2020 @11 :00

## 2020-03-23 ENCOUNTER — Other Ambulatory Visit: Payer: Self-pay | Admitting: Family Medicine

## 2020-03-23 DIAGNOSIS — I10 Essential (primary) hypertension: Secondary | ICD-10-CM

## 2020-04-14 ENCOUNTER — Encounter: Payer: Self-pay | Admitting: Family Medicine

## 2020-04-14 ENCOUNTER — Ambulatory Visit: Payer: PPO

## 2020-04-28 ENCOUNTER — Encounter: Payer: Self-pay | Admitting: Family Medicine

## 2020-04-28 ENCOUNTER — Ambulatory Visit: Payer: PPO

## 2020-05-16 ENCOUNTER — Encounter: Payer: Self-pay | Admitting: Family Medicine

## 2020-05-19 ENCOUNTER — Encounter: Payer: Self-pay | Admitting: Family Medicine

## 2020-05-19 DIAGNOSIS — I10 Essential (primary) hypertension: Secondary | ICD-10-CM

## 2020-05-19 MED ORDER — LISINOPRIL-HYDROCHLOROTHIAZIDE 10-12.5 MG PO TABS: 1.0000 | ORAL_TABLET | Freq: Every day | ORAL | 0 refills | Status: DC

## 2020-05-22 ENCOUNTER — Encounter: Payer: Self-pay | Admitting: Family Medicine

## 2020-05-29 ENCOUNTER — Ambulatory Visit: Payer: PPO | Admitting: Family Medicine

## 2020-06-02 ENCOUNTER — Encounter: Payer: Self-pay | Admitting: Family Medicine

## 2020-06-02 ENCOUNTER — Ambulatory Visit: Payer: PPO

## 2020-06-02 NOTE — Telephone Encounter (Signed)
Pt messaged office to cancel appointment on nurse schedule of B12 injection. Will get injection on 06/09/20 visit with provider.

## 2020-06-09 ENCOUNTER — Encounter: Payer: Self-pay | Admitting: Family Medicine

## 2020-06-09 ENCOUNTER — Ambulatory Visit (INDEPENDENT_AMBULATORY_CARE_PROVIDER_SITE_OTHER): Payer: PPO | Admitting: Family Medicine

## 2020-06-09 ENCOUNTER — Other Ambulatory Visit: Payer: Self-pay

## 2020-06-09 VITALS — BP 116/70 | HR 81 | Temp 97.9°F | Resp 18 | Ht 64.0 in | Wt 167.6 lb

## 2020-06-09 DIAGNOSIS — I10 Essential (primary) hypertension: Secondary | ICD-10-CM

## 2020-06-09 DIAGNOSIS — E538 Deficiency of other specified B group vitamins: Secondary | ICD-10-CM

## 2020-06-09 MED ORDER — CYANOCOBALAMIN 1000 MCG/ML IJ SOLN
1000.0000 ug | INTRAMUSCULAR | Status: DC
  Administered 2020-06-09: 1000 ug via INTRAMUSCULAR

## 2020-06-09 MED ORDER — LISINOPRIL-HYDROCHLOROTHIAZIDE 10-12.5 MG PO TABS: 1.0000 | ORAL_TABLET | Freq: Every day | ORAL | 1 refills | Status: DC

## 2020-06-09 MED ORDER — CYANOCOBALAMIN 1000 MCG/ML IJ SOLN: 1000.0000 ug | Freq: Once | INTRAMUSCULAR | 0 refills | Status: DC

## 2020-06-09 NOTE — Progress Notes (Signed)
Patient ID: Dominique Gonzalez, female    DOB: 1951/03/22  Age: 69 y.o. MRN: [id number]    Subjective:  Subjective  HPI [name] presents for f/u bp and refill on meds    No complaints  She also needs her b12 shot and labs   Review of Systems  Constitutional: Negative for appetite change, diaphoresis, fatigue and unexpected weight change.  Eyes: Negative for pain, redness and visual disturbance.  Respiratory: Negative for cough, chest tightness, shortness of breath and wheezing.   Cardiovascular: Negative for chest pain, palpitations and leg swelling.  Endocrine: Negative for cold intolerance, heat intolerance, polydipsia, polyphagia and polyuria.  Genitourinary: Negative for difficulty urinating, dysuria and frequency.  Neurological: Negative for dizziness, light-headedness, numbness and headaches.    History Past Medical History:  Diagnosis Date  . Aortic insufficiency    mild  . B12 deficiency   . Cardiac murmur   . Diastolic dysfunction   . Fatigue   . Hip pain, right   . HTN (hypertension)   . Hyperlipidemia   . Leg pain, right   . Osteopenia   . Shoulder pain, left   . Sinusitis, acute    NOS  . URI (upper respiratory infection)     She has a past surgical history that includes Abdominal hysterectomy and Bilateral oophorectomy.   Her family history includes Breast cancer in her mother; Diabetes in her father; Hypertension in her father and mother.She reports that she quit smoking about 24 years ago. She has never used smokeless tobacco. She reports that she does not drink alcohol and does not use drugs.  Current Outpatient Medications on File Prior to Visit  Medication Sig Dispense Refill  . aspirin 81 MG tablet Take 81 mg by mouth daily.      . cyclobenzaprine (FLEXERIL) 5 MG tablet Take 1 tablet (5 mg total) by mouth 3 (three) times daily as needed for muscle spasms. 30 tablet 1   No current  facility-administered medications on file prior to visit.     Objective:  Objective  Physical Exam Vitals and nursing note reviewed.  Constitutional:      Appearance: She is well-developed.  HENT:     Head: Normocephalic and atraumatic.  Eyes:     Conjunctiva/sclera: Conjunctivae normal.  Neck:     Thyroid: No thyromegaly.     Vascular: No carotid bruit or JVD.  Cardiovascular:     Rate and Rhythm: Normal rate and regular rhythm.     Heart sounds: Normal heart sounds. No murmur heard.   Pulmonary:     Effort: Pulmonary effort is normal. No respiratory distress.     Breath sounds: Normal breath sounds. No wheezing or rales.  Chest:     Chest wall: No tenderness.  Musculoskeletal:     Cervical back: Normal range of motion and neck supple.  Neurological:     Mental Status: She is alert and oriented to person, place, and time.    BP 116/70 (BP Location: Right Arm, Patient Position: Sitting, Cuff Size: Normal)   Pulse 81   Temp 97.9 F (36.6 C) (Oral)   Resp 18   Ht 5\' 4"  (1.626 m)   Wt 167 lb 9.6 oz (76 kg)   LMP  (LMP Unknown)   SpO2 96%   BMI 28.77 kg/m  Wt Readings  from Last 3 Encounters:  06/09/20 167 lb 9.6 oz (76 kg)  07/19/19 164 lb (74.4 kg)  06/03/19 161 lb 1.9 oz (73.1 kg)     Lab Results  Component Value Date   WBC 6.9 07/19/2019   HGB 14.1 07/19/2019   HCT 41.1 07/19/2019   PLT 221.0 07/19/2019   GLUCOSE 107 (H) 07/19/2019   CHOL 187 07/19/2019   TRIG 184.0 (H) 07/19/2019   HDL 43.50 07/19/2019   LDLDIRECT 149.3 07/28/2007   LDLCALC 107 (H) 07/19/2019   ALT 14 07/19/2019   AST 20 07/19/2019   NA 139 07/19/2019   K 3.7 07/19/2019   CL 103 07/19/2019   CREATININE 0.70 07/19/2019   BUN 10 07/19/2019   CO2 27 07/19/2019   TSH 1.37 05/02/2015   HGBA1C 5.4 06/05/2016    DG Knee Complete 4 Views Right  Result Date: 04/28/2016 CLINICAL DATA:  Stood from a seated position on her sofa this evening and heard her RIGHT leg pop, unable to bear  weight since on RIGHT leg, having lateral RIGHT hip pain, anterior and posterior knee pain, intermittent RIGHT leg numbness, history hypertension, hyperlipidemia, initial encounter EXAM: RIGHT KNEE - COMPLETE 4+ VIEW COMPARISON:  None. FINDINGS: Osseous demineralization. Diffuse joint space narrowing with spur formation at medial compartment. No acute fracture, dislocation or bone destruction. No knee joint effusion. 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 No acute abnormalities. Electronically Signed   By: Lavonia Dana M.D.   On: 04/28/2016 21:23   DG Hip Unilat With Pelvis 2-3 Views Right  Result Date: 04/28/2016 CLINICAL DATA:  Stood from a seated position on her sofa this evening and heard her RIGHT leg pop, unable to bear weight since on RIGHT leg, having lateral RIGHT hip pain, anterior and posterior knee pain, intermittent RIGHT leg numbness, history hypertension, hyperlipidemia, initial encounter EXAM: DG HIP (WITH OR WITHOUT PELVIS) 2-3V RIGHT COMPARISON:  None. FINDINGS: Diffuse osseous demineralization. Minimal narrowing of the hip joints. Pelvis intact. No acute fracture, dislocation or bone destruction. IMPRESSION: Osseous demineralization without acute bony abnormalities. Electronically Signed   By: Lavonia Dana M.D.   On: 04/28/2016 21:19   DG Femur Min 2 Views Right  Result Date: 04/28/2016 CLINICAL DATA:  Stood from a seated position on her sofa this evening and heard her RIGHT leg pop, unable to bear weight since on RIGHT leg, having lateral RIGHT hip pain, anterior and posterior knee pain, intermittent RIGHT leg numbness, history hypertension, hyperlipidemia, initial encounter EXAM: RIGHT FEMUR 2 VIEWS COMPARISON:  None. FINDINGS: RIGHT knee and RIGHT hip radiographs reported separately. Osseous demineralization. No acute fracture, dislocation or bone destruction. IMPRESSION: No acute osseous abnormalities. Electronically Signed   By: Lavonia Dana M.D.   On:  04/28/2016 21:24     Assessment & Plan:  Plan  I have discontinued Lakeisha Latona's cyanocobalamin. I am also having her maintain her aspirin, cyclobenzaprine, and lisinopril-hydrochlorothiazide. We administered cyanocobalamin. We will continue to administer cyanocobalamin.  Meds ordered this encounter  Medications  . lisinopril-hydrochlorothiazide (ZESTORETIC) 10-12.5 MG tablet    Sig: Take 1 tablet by mouth daily.    Dispense:  90 tablet    Refill:  1    Requested drug refills are authorized, however, the patient needs further evaluation and/or laboratory testing before further refills are given. Ask her to make an appointment for this.  Marland Kitchen DISCONTD: cyanocobalamin (,VITAMIN B-12,) 1000 MCG/ML injection    Sig: Inject 1 mL (1,000 mcg total) into the muscle once  for 1 dose.    Dispense:  1 mL    Refill:  0  . cyanocobalamin ((VITAMIN B-12)) injection 1,000 mcg    Problem List Items Addressed This Visit      Unprioritized   Essential hypertension    Well controlled, no changes to meds. Encouraged heart healthy diet such as the DASH diet and exercise as tolerated.  con't lisinopril / hct        Relevant Medications   lisinopril-hydrochlorothiazide (ZESTORETIC) 10-12.5 MG tablet   Other Relevant Orders   Lipid panel   Comprehensive metabolic panel    Other Visit Diagnoses    Vitamin B12 deficiency    -  Primary   Relevant Medications   cyanocobalamin ((VITAMIN B-12)) injection 1,000 mcg   Other Relevant Orders   Vitamin B12      Follow-up: Return in about 6 months (around 12/08/2020), or if symptoms worsen or fail to improve, for annual exam, fasting.  Ann Held, DO

## 2020-06-09 NOTE — Patient Instructions (Signed)
DASH Eating Plan DASH stands for "Dietary Approaches to Stop Hypertension." The DASH eating plan is a healthy eating plan that has been shown to reduce high blood pressure (hypertension). It may also reduce your risk for type 2 diabetes, heart disease, and stroke. The DASH eating plan may also help with weight loss. What are tips for following this plan?  General guidelines  Avoid eating more than 2,300 mg (milligrams) of salt (sodium) a day. If you have hypertension, you may need to reduce your sodium intake to 1,500 mg a day.  Limit alcohol intake to no more than 1 drink a day for nonpregnant women and 2 drinks a day for men. One drink equals 12 oz of beer, 5 oz of wine, or 1 oz of hard liquor.  Work with your health care provider to maintain a healthy body weight or to lose weight. Ask what an ideal weight is for you.  Get at least 30 minutes of exercise that causes your heart to beat faster (aerobic exercise) most days of the week. Activities may include walking, swimming, or biking.  Work with your health care provider or diet and nutrition specialist (dietitian) to adjust your eating plan to your individual calorie needs. Reading food labels   Check food labels for the amount of sodium per serving. Choose foods with less than 5 percent of the Daily Value of sodium. Generally, foods with less than 300 mg of sodium per serving fit into this eating plan.  To find whole grains, look for the word "whole" as the first word in the ingredient list. Shopping  Buy products labeled as "low-sodium" or "no salt added."  Buy fresh foods. Avoid canned foods and premade or frozen meals. Cooking  Avoid adding salt when cooking. Use salt-free seasonings or herbs instead of table salt or sea salt. Check with your health care provider or pharmacist before using salt substitutes.  Do not fry foods. Cook foods using healthy methods such as baking, boiling, grilling, and broiling instead.  Cook with  heart-healthy oils, such as olive, canola, soybean, or sunflower oil. Meal planning  Eat a balanced diet that includes: ? 5 or more servings of fruits and vegetables each day. At each meal, try to fill half of your plate with fruits and vegetables. ? Up to 6-8 servings of whole grains each day. ? Less than 6 oz of lean meat, poultry, or fish each day. A 3-oz serving of meat is about the same size as a deck of cards. One egg equals 1 oz. ? 2 servings of low-fat dairy each day. ? A serving of nuts, seeds, or beans 5 times each week. ? Heart-healthy fats. Healthy fats called Omega-3 fatty acids are found in foods such as flaxseeds and coldwater fish, like sardines, salmon, and mackerel.  Limit how much you eat of the following: ? Canned or prepackaged foods. ? Food that is high in trans fat, such as fried foods. ? Food that is high in saturated fat, such as fatty meat. ? Sweets, desserts, sugary drinks, and other foods with added sugar. ? Full-fat dairy products.  Do not salt foods before eating.  Try to eat at least 2 vegetarian meals each week.  Eat more home-cooked food and less restaurant, buffet, and fast food.  When eating at a restaurant, ask that your food be prepared with less salt or no salt, if possible. What foods are recommended? The items listed may not be a complete list. Talk with your dietitian about   what dietary choices are best for you. Grains Whole-grain or whole-wheat bread. Whole-grain or whole-wheat pasta. Brown rice. Oatmeal. Quinoa. Bulgur. Whole-grain and low-sodium cereals. Pita bread. Low-fat, low-sodium crackers. Whole-wheat flour tortillas. Vegetables Fresh or frozen vegetables (raw, steamed, roasted, or grilled). Low-sodium or reduced-sodium tomato and vegetable juice. Low-sodium or reduced-sodium tomato sauce and tomato paste. Low-sodium or reduced-sodium canned vegetables. Fruits All fresh, dried, or frozen fruit. Canned fruit in natural juice (without  added sugar). Meat and other protein foods Skinless chicken or turkey. Ground chicken or turkey. Pork with fat trimmed off. Fish and seafood. Egg whites. Dried beans, peas, or lentils. Unsalted nuts, nut butters, and seeds. Unsalted canned beans. Lean cuts of beef with fat trimmed off. Low-sodium, lean deli meat. Dairy Low-fat (1%) or fat-free (skim) milk. Fat-free, low-fat, or reduced-fat cheeses. Nonfat, low-sodium ricotta or cottage cheese. Low-fat or nonfat yogurt. Low-fat, low-sodium cheese. Fats and oils Soft margarine without trans fats. Vegetable oil. Low-fat, reduced-fat, or light mayonnaise and salad dressings (reduced-sodium). Canola, safflower, olive, soybean, and sunflower oils. Avocado. Seasoning and other foods Herbs. Spices. Seasoning mixes without salt. Unsalted popcorn and pretzels. Fat-free sweets. What foods are not recommended? The items listed may not be a complete list. Talk with your dietitian about what dietary choices are best for you. Grains Baked goods made with fat, such as croissants, muffins, or some breads. Dry pasta or rice meal packs. Vegetables Creamed or fried vegetables. Vegetables in a cheese sauce. Regular canned vegetables (not low-sodium or reduced-sodium). Regular canned tomato sauce and paste (not low-sodium or reduced-sodium). Regular tomato and vegetable juice (not low-sodium or reduced-sodium). Pickles. Olives. Fruits Canned fruit in a light or heavy syrup. Fried fruit. Fruit in cream or butter sauce. Meat and other protein foods Fatty cuts of meat. Ribs. Fried meat. Bacon. Sausage. Bologna and other processed lunch meats. Salami. Fatback. Hotdogs. Bratwurst. Salted nuts and seeds. Canned beans with added salt. Canned or smoked fish. Whole eggs or egg yolks. Chicken or turkey with skin. Dairy Whole or 2% milk, cream, and half-and-half. Whole or full-fat cream cheese. Whole-fat or sweetened yogurt. Full-fat cheese. Nondairy creamers. Whipped toppings.  Processed cheese and cheese spreads. Fats and oils Butter. Stick margarine. Lard. Shortening. Ghee. Bacon fat. Tropical oils, such as coconut, palm kernel, or palm oil. Seasoning and other foods Salted popcorn and pretzels. Onion salt, garlic salt, seasoned salt, table salt, and sea salt. Worcestershire sauce. Tartar sauce. Barbecue sauce. Teriyaki sauce. Soy sauce, including reduced-sodium. Steak sauce. Canned and packaged gravies. Fish sauce. Oyster sauce. Cocktail sauce. Horseradish that you find on the shelf. Ketchup. Mustard. Meat flavorings and tenderizers. Bouillon cubes. Hot sauce and Tabasco sauce. Premade or packaged marinades. Premade or packaged taco seasonings. Relishes. Regular salad dressings. Where to find more information:  National Heart, Lung, and Blood Institute: www.nhlbi.nih.gov  American Heart Association: www.heart.org Summary  The DASH eating plan is a healthy eating plan that has been shown to reduce high blood pressure (hypertension). It may also reduce your risk for type 2 diabetes, heart disease, and stroke.  With the DASH eating plan, you should limit salt (sodium) intake to 2,300 mg a day. If you have hypertension, you may need to reduce your sodium intake to 1,500 mg a day.  When on the DASH eating plan, aim to eat more fresh fruits and vegetables, whole grains, lean proteins, low-fat dairy, and heart-healthy fats.  Work with your health care provider or diet and nutrition specialist (dietitian) to adjust your eating plan to your   individual calorie needs. This information is not intended to replace advice given to you by your health care provider. Make sure you discuss any questions you have with your health care provider. Document Revised: 07/25/2017 Document Reviewed: 08/05/2016 Elsevier Patient Education  2020 [employer].  

## 2020-06-09 NOTE — Assessment & Plan Note (Signed)
Well controlled, no changes to meds. Encouraged heart healthy diet such as the DASH diet and exercise as tolerated.  °con't lisinopril / hct  °

## 2020-06-10 LAB — COMPREHENSIVE METABOLIC PANEL
AG Ratio: 1.6 (calc) (ref 1.0–2.5)
ALT: 13 U/L (ref 6–29)
AST: 19 U/L (ref 10–35)
Albumin: 4.1 g/dL (ref 3.6–5.1)
Alkaline phosphatase (APISO): 86 U/L (ref 37–153)
BUN: 7 mg/dL (ref 7–25)
CO2: 24 mmol/L (ref 20–32)
Calcium: 9.2 mg/dL (ref 8.6–10.4)
Chloride: 106 mmol/L (ref 98–110)
Creat: 0.69 mg/dL (ref 0.50–0.99)
Globulin: 2.5 g/dL (calc) (ref 1.9–3.7)
Glucose, Bld: 137 mg/dL — ABNORMAL HIGH (ref 65–99)
Potassium: 3.5 mmol/L (ref 3.5–5.3)
Sodium: 139 mmol/L (ref 135–146)
Total Bilirubin: 0.7 mg/dL (ref 0.2–1.2)
Total Protein: 6.6 g/dL (ref 6.1–8.1)

## 2020-06-10 LAB — LIPID PANEL
Cholesterol: 170 mg/dL (ref <200)
HDL: 46 mg/dL — ABNORMAL LOW (ref >=50)
LDL Cholesterol (Calc): 101 mg/dL (calc) — ABNORMAL HIGH
Non-HDL Cholesterol (Calc): 124 mg/dL (calc) (ref <130)
Total CHOL/HDL Ratio: 3.7 (calc) (ref <5.0)
Triglycerides: 125 mg/dL (ref <150)

## 2020-06-10 LAB — VITAMIN B12: Vitamin B-12: 386 pg/mL (ref 200–1100)

## 2020-06-12 ENCOUNTER — Other Ambulatory Visit: Payer: Self-pay

## 2020-06-12 DIAGNOSIS — I10 Essential (primary) hypertension: Secondary | ICD-10-CM

## 2020-06-12 MED ORDER — LISINOPRIL-HYDROCHLOROTHIAZIDE 10-12.5 MG PO TABS: 1.0000 | ORAL_TABLET | Freq: Every day | ORAL | 1 refills | Status: DC

## 2020-06-15 ENCOUNTER — Other Ambulatory Visit: Payer: Self-pay | Admitting: Family Medicine

## 2020-06-15 ENCOUNTER — Encounter: Payer: Self-pay | Admitting: Family Medicine

## 2020-06-15 DIAGNOSIS — R739 Hyperglycemia, unspecified: Secondary | ICD-10-CM

## 2020-06-19 ENCOUNTER — Encounter: Payer: Self-pay | Admitting: Family Medicine

## 2020-06-19 ENCOUNTER — Other Ambulatory Visit: Payer: Self-pay

## 2020-06-19 ENCOUNTER — Encounter: Payer: Self-pay | Admitting: Cardiovascular Disease

## 2020-06-19 ENCOUNTER — Ambulatory Visit: Payer: PPO | Admitting: Cardiovascular Disease

## 2020-06-19 ENCOUNTER — Other Ambulatory Visit: Payer: PPO

## 2020-06-19 VITALS — BP 112/60 | HR 81 | Ht 64.0 in | Wt 167.6 lb

## 2020-06-19 DIAGNOSIS — I351 Nonrheumatic aortic (valve) insufficiency: Secondary | ICD-10-CM

## 2020-06-19 DIAGNOSIS — I10 Essential (primary) hypertension: Secondary | ICD-10-CM

## 2020-06-19 DIAGNOSIS — R739 Hyperglycemia, unspecified: Secondary | ICD-10-CM

## 2020-06-19 NOTE — Patient Instructions (Signed)
Medication Instructions:  The current medical regimen is effective;  continue present plan and medications.  *If you need a refill on your cardiac medications before your next appointment, please call your pharmacy*  Follow-Up: At [facility], you and your health needs are our priority.  As part of our continuing mission to provide you with exceptional heart care, we have created designated Provider Care Teams.  These Care Teams include your primary Cardiologist (physician) and Advanced Practice Providers (APPs -  Physician Assistants and Nurse Practitioners) who all work together to provide you with the care you need, when you need it.  We recommend signing up for the patient portal called "MyChart".  Sign up information is provided on this After Visit Summary.  MyChart is used to connect with patients for Virtual Visits (Telemedicine).  Patients are able to view lab/test results, encounter notes, upcoming appointments, etc.  Non-urgent messages can be sent to your provider as well.   To learn more about what you can do with MyChart, go to [email or web address].    Your next appointment:   12 month(s)  The format for your next appointment:   In Person  Provider:   Lauree Chandler, MD  Thank you for choosing [facility]!!

## 2020-06-19 NOTE — Progress Notes (Signed)
Chief Complaint  Patient presents with  . Follow-up    aortic valve disease   History of Present Illness: 69 yo female with history of aortic valve insufficiency, mitral regurgitation, HTN here today for follow up. She has been followed in my office since 2011 when she was found to have mild aortic valve insufficiency. Echo in 2011 showed normal LV systolic function, diastolic dysfunction, mild AI, small effusion and possible epicardial mass/aortic root mass. I ordered a cardiac MRI which showed prominent epicardial fat pad with no evidence of aortic or pericardial tumor or mass. Most recent echo June 2021 with normal LV function, trivial AI.   She is here today for follow up. The patient denies any chest pain, dyspnea, palpitations, lower extremity edema, orthopnea, PND, dizziness, near syncope or syncope.   Primary Care Physician:  Carollee Herter, Alferd Apa, DO  Past Medical History:  Diagnosis Date  . Aortic insufficiency    mild  . B12 deficiency   . Cardiac murmur   . Diastolic dysfunction   . Fatigue   . Hip pain, right   . HTN (hypertension)   . Hyperlipidemia   . Leg pain, right   . Osteopenia   . Shoulder pain, left   . Sinusitis, acute    NOS  . URI (upper respiratory infection)     Past Surgical History:  Procedure Laterality Date  . ABDOMINAL HYSTERECTOMY     age 64  . BILATERAL OOPHORECTOMY      Current Outpatient Medications  Medication Sig Dispense Refill  . aspirin 81 MG tablet Take 81 mg by mouth daily.      Marland Kitchen lisinopril-hydrochlorothiazide (ZESTORETIC) 10-12.5 MG tablet Take 1 tablet by mouth daily. 90 tablet 1   Current Facility-Administered Medications  Medication Dose Route Frequency Provider Last Rate Last Admin  . cyanocobalamin ((VITAMIN B-12)) injection 1,000 mcg  1,000 mcg Intramuscular Q30 days Carollee Herter, Kendrick Fries R, DO   1,000 mcg at 06/09/20 1132    Allergies  Allergen Reactions  . Macrobid [Nitrofurantoin] Nausea And Vomiting    vomiting   . Bactrim [Sulfamethoxazole-Trimethoprim] Hives    Social History   Socioeconomic History  . Marital status: Married    Spouse name: Not on file  . Number of children: 2  . Years of education: Not on file  . Highest education level: Not on file  Occupational History  . Not on file  Tobacco Use  . Smoking status: Former Smoker    Quit date: 01/10/1996    Years since quitting: 24.4  . Smokeless tobacco: Never Used  . Tobacco comment: smoked for 10 years,   Vaping Use  . Vaping Use: Never used  Substance and Sexual Activity  . Alcohol use: No  . Drug use: No  . Sexual activity: Not on file  Other Topics Concern  . Not on file  Social History Narrative  . Not on file   Social Determinants of Health   Financial Resource Strain:   . Difficulty of Paying Living Expenses: Not on file  Food Insecurity:   . Worried About Charity fundraiser in the Last Year: Not on file  . Ran Out of Food in the Last Year: Not on file  Transportation Needs:   . Lack of Transportation (Medical): Not on file  . Lack of Transportation (Non-Medical): Not on file  Physical Activity:   . Days of Exercise per Week: Not on file  . Minutes of Exercise per Session: Not on file  Stress:   . Feeling of Stress : Not on file  Social Connections:   . Frequency of Communication with Friends and Family: Not on file  . Frequency of Social Gatherings with Friends and Family: Not on file  . Attends Religious Services: Not on file  . Active Member of Clubs or Organizations: Not on file  . Attends Archivist Meetings: Not on file  . Marital Status: Not on file  Intimate Partner Violence:   . Fear of Current or Ex-Partner: Not on file  . Emotionally Abused: Not on file  . Physically Abused: Not on file  . Sexually Abused: Not on file    Family History  Problem Relation Age of Onset  . Hypertension Father   . Diabetes Father   . Hypertension Mother   . Breast cancer Mother     Review of  Systems:  As stated in the HPI and otherwise negative.   BP 112/60   Pulse 81   Ht 5\' 4"  (1.626 m)   Wt 167 lb 9.6 oz (76 kg)   LMP  (LMP Unknown)   SpO2 97%   BMI 28.77 kg/m   Physical Examination:   Echo June 2021: 1. Left ventricular ejection fraction, by estimation, is 60 to 65%. The  left ventricle has normal function. The left ventricle has no regional  wall motion abnormalities. Left ventricular diastolic parameters are  consistent with Grade I diastolic  dysfunction (impaired relaxation).  2. Right ventricular systolic function is normal. The right ventricular  size is normal.  3. The mitral valve is normal in structure. No evidence of mitral valve  regurgitation. No evidence of mitral stenosis.  4. The aortic valve is normal in structure. Aortic valve regurgitation is  trivial. No aortic stenosis is present.   EKG:  EKG is ordered today. The ekg ordered today demonstrates Sinus rhythm, rate 81 bpm.   Recent Labs: 07/19/2019: Hemoglobin 14.1; Platelets 221.0 06/09/2020: ALT 13; BUN 7; Creat 0.69; Potassium 3.5; Sodium 139   Lipid Panel    Component Value Date/Time   CHOL 170 06/09/2020 1121   TRIG 125 06/09/2020 1121   HDL 46 (L) 06/09/2020 1121   CHOLHDL 3.7 06/09/2020 1121   VLDL 36.8 07/19/2019 1409   LDLCALC 101 (H) 06/09/2020 1121   LDLDIRECT 149.3 07/28/2007 0000     Wt Readings from Last 3 Encounters:  06/19/20 167 lb 9.6 oz (76 kg)  06/09/20 167 lb 9.6 oz (76 kg)  07/19/19 164 lb (74.4 kg)     Other studies Reviewed: Additional studies/ records that were reviewed today include: . Review of the above records demonstrates:    Assessment and Plan:   1. AORTIC VALVE INSUFFICIENCY: Trivial by echo June 2021  2. Mitral valve regurgitation: No significant MR by echo June 2021.   3. HTN: BP is controlled. Continue current therapy  Current medicines are reviewed at length with the patient today.  The patient does not have concerns regarding  medicines.  The following changes have been made:  no change  Labs/ tests ordered today include:   Orders Placed This Encounter  Procedures  . EKG 12-Lead    Disposition:   FU with me in 12  months  Signed, Lauree Chandler, MD 06/19/2020 3:04 PM    [facility] [address] [place], [place], [place]  [address] Phone: [phone]; Fax: [phone]

## 2020-06-20 ENCOUNTER — Other Ambulatory Visit: Payer: Self-pay | Admitting: Family Medicine

## 2020-06-20 DIAGNOSIS — I10 Essential (primary) hypertension: Secondary | ICD-10-CM

## 2020-06-20 DIAGNOSIS — R739 Hyperglycemia, unspecified: Secondary | ICD-10-CM

## 2020-06-20 LAB — HEMOGLOBIN A1C
Hgb A1c MFr Bld: 5.2 % of total Hgb (ref <5.7)
Mean Plasma Glucose: 103 (calc)
eAG (mmol/L): 5.7 (calc)

## 2020-06-20 LAB — COMPREHENSIVE METABOLIC PANEL
AG Ratio: 1.6 (calc) (ref 1.0–2.5)
ALT: 15 U/L (ref 6–29)
AST: 19 U/L (ref 10–35)
Albumin: 4 g/dL (ref 3.6–5.1)
Alkaline phosphatase (APISO): 85 U/L (ref 37–153)
BUN: 11 mg/dL (ref 7–25)
CO2: 27 mmol/L (ref 20–32)
Calcium: 9.1 mg/dL (ref 8.6–10.4)
Chloride: 107 mmol/L (ref 98–110)
Creat: 0.8 mg/dL (ref 0.50–0.99)
Globulin: 2.5 g/dL (calc) (ref 1.9–3.7)
Glucose, Bld: 112 mg/dL — ABNORMAL HIGH (ref 65–99)
Potassium: 4.2 mmol/L (ref 3.5–5.3)
Sodium: 141 mmol/L (ref 135–146)
Total Bilirubin: 0.6 mg/dL (ref 0.2–1.2)
Total Protein: 6.5 g/dL (ref 6.1–8.1)

## 2020-06-20 NOTE — Telephone Encounter (Signed)
Her labs were done yesterday and just released to me-- I will put my comments in as soon as I am able to look at the lab

## 2020-11-24 DIAGNOSIS — Z1231 Encounter for screening mammogram for malignant neoplasm of breast: Secondary | ICD-10-CM | POA: Diagnosis not present

## 2020-11-24 LAB — HM MAMMOGRAPHY

## 2020-11-28 ENCOUNTER — Encounter: Payer: Self-pay | Admitting: Family Medicine

## 2020-12-13 ENCOUNTER — Other Ambulatory Visit: Payer: Self-pay

## 2020-12-13 ENCOUNTER — Encounter: Payer: Self-pay | Admitting: Family Medicine

## 2020-12-13 ENCOUNTER — Other Ambulatory Visit (INDEPENDENT_AMBULATORY_CARE_PROVIDER_SITE_OTHER): Payer: PPO

## 2020-12-13 ENCOUNTER — Ambulatory Visit (INDEPENDENT_AMBULATORY_CARE_PROVIDER_SITE_OTHER): Payer: PPO

## 2020-12-13 DIAGNOSIS — R739 Hyperglycemia, unspecified: Secondary | ICD-10-CM | POA: Diagnosis not present

## 2020-12-13 DIAGNOSIS — E538 Deficiency of other specified B group vitamins: Secondary | ICD-10-CM | POA: Diagnosis not present

## 2020-12-13 DIAGNOSIS — I10 Essential (primary) hypertension: Secondary | ICD-10-CM | POA: Diagnosis not present

## 2020-12-13 LAB — COMPREHENSIVE METABOLIC PANEL
ALT: 14 U/L (ref 0–35)
AST: 19 U/L (ref 0–37)
Albumin: 3.9 g/dL (ref 3.5–5.2)
Alkaline Phosphatase: 90 U/L (ref 39–117)
BUN: 9 mg/dL (ref 6–23)
CO2: 26 mEq/L (ref 19–32)
Calcium: 9 mg/dL (ref 8.4–10.5)
Chloride: 103 mEq/L (ref 96–112)
Creatinine, Ser: 0.78 mg/dL (ref 0.40–1.20)
GFR: 78.05 mL/min (ref >60.00)
Glucose, Bld: 110 mg/dL — ABNORMAL HIGH (ref 70–99)
Potassium: 3.6 mEq/L (ref 3.5–5.1)
Sodium: 138 mEq/L (ref 135–145)
Total Bilirubin: 0.7 mg/dL (ref 0.2–1.2)
Total Protein: 6.6 g/dL (ref 6.0–8.3)

## 2020-12-13 LAB — LIPID PANEL
Cholesterol: 167 mg/dL (ref 0–200)
HDL: 48.9 mg/dL (ref >39.00)
LDL Cholesterol: 102 mg/dL — ABNORMAL HIGH (ref 0–99)
NonHDL: 118.45
Total CHOL/HDL Ratio: 3
Triglycerides: 84 mg/dL (ref 0.0–149.0)
VLDL: 16.8 mg/dL (ref 0.0–40.0)

## 2020-12-13 LAB — HEMOGLOBIN A1C: Hgb A1c MFr Bld: 5.2 % (ref 4.6–6.5)

## 2020-12-13 MED ORDER — CYANOCOBALAMIN 1000 MCG/ML IJ SOLN
1000.0000 ug | Freq: Once | INTRAMUSCULAR | Status: AC
  Administered 2020-12-13: 1000 ug via INTRAMUSCULAR

## 2020-12-13 NOTE — Progress Notes (Signed)
Pt is here today for a B12 injection. Pt was given B12 injection in right deltoid. Pt tolerated injection well.

## 2021-01-12 ENCOUNTER — Ambulatory Visit: Payer: PPO

## 2021-01-18 ENCOUNTER — Ambulatory Visit (INDEPENDENT_AMBULATORY_CARE_PROVIDER_SITE_OTHER): Payer: PPO | Admitting: *Deleted

## 2021-01-18 ENCOUNTER — Other Ambulatory Visit: Payer: Self-pay

## 2021-01-18 DIAGNOSIS — E538 Deficiency of other specified B group vitamins: Secondary | ICD-10-CM

## 2021-01-18 MED ORDER — CYANOCOBALAMIN 1000 MCG/ML IJ SOLN
1000.0000 ug | Freq: Once | INTRAMUSCULAR | Status: AC
  Administered 2021-01-18: 1000 ug via INTRAMUSCULAR

## 2021-01-18 NOTE — Progress Notes (Signed)
Patient her for b12 injection per Dr. Carollee Herter.  Injection given in right deltoid and patient tolerated well.  Patient was due for follow up with Dr. Carollee Herter last month.  Appointment made with provider and she will get the next injection at that time.

## 2021-02-12 ENCOUNTER — Other Ambulatory Visit: Payer: Self-pay | Admitting: Family Medicine

## 2021-02-12 DIAGNOSIS — I10 Essential (primary) hypertension: Secondary | ICD-10-CM

## 2021-02-19 ENCOUNTER — Encounter: Payer: Self-pay | Admitting: Family Medicine

## 2021-02-19 ENCOUNTER — Other Ambulatory Visit: Payer: Self-pay

## 2021-02-19 ENCOUNTER — Telehealth (INDEPENDENT_AMBULATORY_CARE_PROVIDER_SITE_OTHER): Payer: PPO | Admitting: Family Medicine

## 2021-02-19 DIAGNOSIS — I10 Essential (primary) hypertension: Secondary | ICD-10-CM | POA: Diagnosis not present

## 2021-02-19 MED ORDER — LISINOPRIL-HYDROCHLOROTHIAZIDE 10-12.5 MG PO TABS: 1.0000 | ORAL_TABLET | Freq: Every day | ORAL | 1 refills | Status: DC

## 2021-02-19 NOTE — Assessment & Plan Note (Signed)
Well controlled, no changes to meds. Encouraged heart healthy diet such as the DASH diet and exercise as tolerated.  °

## 2021-02-19 NOTE — Progress Notes (Signed)
MyChart Video Visit    Virtual Visit via Video Note   This visit type was conducted due to national recommendations for restrictions regarding the COVID-19 Pandemic (e.g. social distancing) in an effort to limit this patient's exposure and mitigate transmission in our community. This patient is at least at moderate risk for complications without adequate follow up. This format is felt to be most appropriate for this patient at this time. Physical exam was limited by quality of the video and audio technology used for the visit. Alinda Dooms  was able to get the patient set up on a video visit.--- pt unable to do video visit-- had to transition to telephone   Patient location: Home Patient and provider in visit Provider location: Office  I discussed the limitations of evaluation and management by telemedicine and the availability of in person appointments. The patient expressed understanding and agreed to proceed.  Visit Date: 02/19/2021  Today's healthcare provider: Ann Held, DO     Subjective:    Patient ID: Dominique Gonzalez, female    DOB: 27-Apr-1951, 70 y.o.   MRN: [id number]  No chief complaint on file.   HPI Patient is in today for f/u bp.  She had a headache today so changed to a virtual visit.  No other complaints   Past Medical History:  Diagnosis Date   Aortic insufficiency    mild   B12 deficiency    Cardiac murmur    Diastolic dysfunction    Fatigue    Hip pain, right    HTN (hypertension)    Hyperlipidemia    Leg pain, right    Osteopenia    Shoulder pain, left    Sinusitis, acute    NOS   URI (upper respiratory infection)     Past Surgical History:  Procedure Laterality Date   ABDOMINAL HYSTERECTOMY     age 80   BILATERAL OOPHORECTOMY      Family History  Problem Relation Age of Onset   Hypertension Father    Diabetes Father    Hypertension Mother    Breast cancer Mother     Social History   Socioeconomic History    Marital status: Married    Spouse name: Not on file   Number of children: 2   Years of education: Not on file   Highest education level: Not on file  Occupational History   Not on file  Tobacco Use   Smoking status: Former    Pack years: 0.00    Types: Cigarettes    Quit date: 01/10/1996    Years since quitting: 25.1   Smokeless tobacco: Never   Tobacco comments:    smoked for 10 years,   Vaping Use   Vaping Use: Never used  Substance and Sexual Activity   Alcohol use: No   Drug use: No   Sexual activity: Not on file  Other Topics Concern   Not on file  Social History Narrative   Not on file   Social Determinants of Health   Financial Resource Strain: Not on file  Food Insecurity: Not on file  Transportation Needs: Not on file  Physical Activity: Not on file  Stress: Not on file  Social Connections: Not on file  Intimate Partner Violence: Not on file    Outpatient Medications Prior to Visit  Medication Sig Dispense Refill   aspirin 81 MG tablet Take 81 mg by mouth daily.       lisinopril-hydrochlorothiazide (ZESTORETIC) 10-12.5 MG  tablet Take 1 tablet by mouth daily. Pt needs office visit for further refills 30 tablet 0   No facility-administered medications prior to visit.    Allergies  Allergen Reactions   Macrobid [Nitrofurantoin] Nausea And Vomiting    vomiting   Bactrim [Sulfamethoxazole-Trimethoprim] Hives    Review of Systems  Constitutional:  Negative for chills and fever.  HENT:  Negative for ear pain, sinus pain and sore throat.   Eyes:  Negative for blurred vision, pain and discharge.  Respiratory:  Negative for cough, sputum production, shortness of breath and stridor.   Cardiovascular:  Negative for chest pain and palpitations.  Gastrointestinal:  Negative for constipation, diarrhea, heartburn and nausea.  Genitourinary:  Negative for flank pain.  Musculoskeletal:  Negative for back pain.  Skin:  Negative for rash.  Neurological:  Positive  for headaches. Negative for dizziness and tremors.  Psychiatric/Behavioral:  The patient is not nervous/anxious.       Objective:    Physical Exam Constitutional:      General: She is not in acute distress.    Appearance: Normal appearance. She is not ill-appearing.  Pulmonary:     Effort: Pulmonary effort is normal. No respiratory distress.  Neurological:     Mental Status: She is alert.  Psychiatric:        Behavior: Behavior normal.        Thought Content: Thought content normal.    LMP  (LMP Unknown)  Wt Readings from Last 3 Encounters:  06/19/20 167 lb 9.6 oz (76 kg)  06/09/20 167 lb 9.6 oz (76 kg)  07/19/19 164 lb (74.4 kg)    Diabetic Foot Exam - Simple   No data filed    Lab Results  Component Value Date   WBC 6.9 07/19/2019   HGB 14.1 07/19/2019   HCT 41.1 07/19/2019   PLT 221.0 07/19/2019   GLUCOSE 110 (H) 12/13/2020   CHOL 167 12/13/2020   TRIG 84.0 12/13/2020   HDL 48.90 12/13/2020   LDLDIRECT 149.3 07/28/2007   LDLCALC 102 (H) 12/13/2020   ALT 14 12/13/2020   AST 19 12/13/2020   NA 138 12/13/2020   K 3.6 12/13/2020   CL 103 12/13/2020   CREATININE 0.78 12/13/2020   BUN 9 12/13/2020   CO2 26 12/13/2020   TSH 1.37 05/02/2015   HGBA1C 5.2 12/13/2020    Lab Results  Component Value Date   TSH 1.37 05/02/2015   Lab Results  Component Value Date   WBC 6.9 07/19/2019   HGB 14.1 07/19/2019   HCT 41.1 07/19/2019   MCV 89.0 07/19/2019   PLT 221.0 07/19/2019   Lab Results  Component Value Date   NA 138 12/13/2020   K 3.6 12/13/2020   CO2 26 12/13/2020   GLUCOSE 110 (H) 12/13/2020   BUN 9 12/13/2020   CREATININE 0.78 12/13/2020   BILITOT 0.7 12/13/2020   ALKPHOS 90 12/13/2020   AST 19 12/13/2020   ALT 14 12/13/2020   PROT 6.6 12/13/2020   ALBUMIN 3.9 12/13/2020   CALCIUM 9.0 12/13/2020   GFR 78.05 12/13/2020   Lab Results  Component Value Date   CHOL 167 12/13/2020   Lab Results  Component Value Date   HDL 48.90 12/13/2020    Lab Results  Component Value Date   LDLCALC 102 (H) 12/13/2020   Lab Results  Component Value Date   TRIG 84.0 12/13/2020   Lab Results  Component Value Date   CHOLHDL 3 12/13/2020   Lab Results  Component Value Date   HGBA1C 5.2 12/13/2020       Assessment & Plan:   Problem List Items Addressed This Visit   None     No orders of the defined types were placed in this encounter.   I discussed the assessment and treatment plan with the patient. The patient was provided an opportunity to ask questions and all were answered. The patient agreed with the plan and demonstrated an understanding of the instructions.   The patient was advised to call back or seek an in-person evaluation if the symptoms worsen or if the condition fails to improve as anticipated.  I provided 25 minutes of face-to-face time during this encounter.   Ann Held, DO [facility] [phone] (phone) [phone] (fax)  [place]

## 2021-02-23 ENCOUNTER — Ambulatory Visit: Payer: PPO

## 2021-03-06 ENCOUNTER — Encounter: Payer: Self-pay | Admitting: Family Medicine

## 2021-03-06 DIAGNOSIS — Z1211 Encounter for screening for malignant neoplasm of colon: Secondary | ICD-10-CM

## 2021-03-07 ENCOUNTER — Other Ambulatory Visit: Payer: Self-pay | Admitting: Family Medicine

## 2021-03-08 ENCOUNTER — Encounter: Payer: Self-pay | Admitting: Family Medicine

## 2021-03-08 ENCOUNTER — Ambulatory Visit: Payer: PPO

## 2021-04-19 ENCOUNTER — Encounter: Payer: Self-pay | Admitting: Family Medicine

## 2021-04-19 DIAGNOSIS — Z1211 Encounter for screening for malignant neoplasm of colon: Secondary | ICD-10-CM

## 2021-05-20 NOTE — Progress Notes (Signed)
Subjective:   Dominique Gonzalez is a 70 y.o. female who presents for an Initial Medicare Annual Wellness Visit.  I connected with Shaunessy today by telephone and verified that I am speaking with the correct person using two identifiers. Location patient: home Location provider: work Persons participating in the virtual visit: patient, Marine scientist.    I discussed the limitations, risks, security and privacy concerns of performing an evaluation and management service by telephone and the availability of in person appointments. I also discussed with the patient that there may be a patient responsible charge related to this service. The patient expressed understanding and verbally consented to this telephonic visit.    Interactive audio and video telecommunications were attempted between this provider and patient, however failed, due to patient having technical difficulties OR patient did not have access to video capability.  We continued and completed visit with audio only.  Some vital signs may be absent or patient reported.   Time Spent with patient on telephone encounter: 20 minutes   Review of Systems     Cardiac Risk Factors include: advanced age (>73mn, >>16women);dyslipidemia;hypertension     Objective:    Today's Vitals   05/21/21 1145  Weight: 160 lb (72.6 kg)  Height: '5\' 4"'  (1.626 m)   Body mass index is 27.46 kg/m.  Advanced Directives 05/21/2021 04/28/2016 10/15/2014  Does Patient Have a Medical Advance Directive? No No No  Would patient like information on creating a medical advance directive? No - Patient declined No - patient declined information No - patient declined information    Current Medications (verified) Outpatient Encounter Medications as of 05/21/2021  Medication Sig   aspirin 81 MG tablet Take 81 mg by mouth daily.     lisinopril-hydrochlorothiazide (ZESTORETIC) 10-12.5 MG tablet Take 1 tablet by mouth daily.   No facility-administered encounter medications on  file as of 05/21/2021.    Allergies (verified) Macrobid [nitrofurantoin] and Bactrim [sulfamethoxazole-trimethoprim]   History: Past Medical History:  Diagnosis Date   Aortic insufficiency    mild   B12 deficiency    Cardiac murmur    Diastolic dysfunction    Fatigue    Hip pain, right    HTN (hypertension)    Hyperlipidemia    Leg pain, right    Osteopenia    Shoulder pain, left    Sinusitis, acute    NOS   URI (upper respiratory infection)    Past Surgical History:  Procedure Laterality Date   ABDOMINAL HYSTERECTOMY     age 70  BILATERAL OOPHORECTOMY     Family History  Problem Relation Age of Onset   Hypertension Father    Diabetes Father    Hypertension Mother    Breast cancer Mother    Social History   Socioeconomic History   Marital status: Married    Spouse name: Not on file   Number of children: 2   Years of education: Not on file   Highest education level: Not on file  Occupational History   Not on file  Tobacco Use   Smoking status: Former    Types: Cigarettes    Quit date: 01/10/1996    Years since quitting: 25.3   Smokeless tobacco: Never   Tobacco comments:    smoked for 10 years,   Vaping Use   Vaping Use: Never used  Substance and Sexual Activity   Alcohol use: No   Drug use: No   Sexual activity: Not on file  Other Topics Concern  Not on file  Social History Narrative   Not on file   Social Determinants of Health   Financial Resource Strain: Low Risk    Difficulty of Paying Living Expenses: Not hard at all  Food Insecurity: No Food Insecurity   Worried About Charity fundraiser in the Last Year: Never true   Arboriculturist in the Last Year: Never true  Transportation Needs: No Transportation Needs   Lack of Transportation (Medical): No   Lack of Transportation (Non-Medical): No  Physical Activity: Sufficiently Active   Days of Exercise per Week: 7 days   Minutes of Exercise per Session: 30 min  Stress: No Stress Concern  Present   Feeling of Stress : Not at all  Social Connections: Moderately Isolated   Frequency of Communication with Friends and Family: More than three times a week   Frequency of Social Gatherings with Friends and Family: More than three times a week   Attends Religious Services: More than 4 times per year   Active Member of [employer] or Organizations: No   Attends Archivist Meetings: Never   Marital Status: Widowed    Tobacco Counseling Counseling given: Not Answered Tobacco comments: smoked for 10 years,    Clinical Intake:  Pre-visit preparation completed: Yes  Pain : No/denies pain     BMI - recorded: 27.46 Nutritional Status: BMI 25 -29 Overweight Nutritional Risks: None Diabetes: No  How often do you need to have someone help you when you read instructions, pamphlets, or other written materials from your doctor or pharmacy?: 1 - Never  Diabetic?No  Interpreter Needed?: No  Information entered by :: Caroleen Hamman LPN   Activities of Daily Living In your present state of health, do you have any difficulty performing the following activities: 05/21/2021 06/09/2020  Hearing? N N  Vision? N N  Difficulty concentrating or making decisions? N N  Walking or climbing stairs? N N  Dressing or bathing? N N  Doing errands, shopping? N N  Preparing Food and eating ? N -  Using the Toilet? N -  In the past six months, have you accidently leaked urine? N -  Do you have problems with loss of bowel control? N -  Managing your Medications? N -  Managing your Finances? N -  Housekeeping or managing your Housekeeping? N -  Some recent data might be hidden    Patient Care Team: Carollee Herter, Alferd Apa, DO as PCP - General Burnell Blanks, MD as PCP - Cardiology (Cardiology)  Indicate any recent Medical Services you may have received from other than [facility] providers in the past year (date may be approximate).     Assessment:   This is a routine wellness  examination for Dominique Gonzalez.  Hearing/Vision screen Hearing Screening - Comments:: No issues Vision Screening - Comments:: Last eye exam-several years ago  Dietary issues and exercise activities discussed: Current Exercise Habits: Home exercise routine, Type of exercise: walking, Time (Minutes): 20, Frequency (Times/Week): 7, Weekly Exercise (Minutes/Week): 140, Intensity: Mild, Exercise limited by: None identified   Goals Addressed             This Visit's Progress    Patient Stated       Continue eating healthier       Depression Screen PHQ 2/9 Scores 05/21/2021 06/09/2020 09/09/2017  PHQ - 2 Score 0 0 0    Fall Risk Fall Risk  05/21/2021 06/09/2020 09/09/2017  Falls in the past year? 0  0 No  Number falls in past yr: 0 0 -  Injury with Fall? 0 0 -  Follow up Falls prevention discussed Falls evaluation completed -    FALL RISK PREVENTION PERTAINING TO THE HOME:  Any stairs in or around the home? No  Home free of loose throw rugs in walkways, pet beds, electrical cords, etc? Yes  Adequate lighting in your home to reduce risk of falls? Yes   ASSISTIVE DEVICES UTILIZED TO PREVENT FALLS:  Life alert? No  Use of a cane, walker or w/c? No  Grab bars in the bathroom? No  Shower chair or bench in shower? No  Elevated toilet seat or a handicapped toilet? No   TIMED UP AND GO:  Was the test performed? No . Phone visit   Cognitive Function:Normal cognitive status assessed by this Nurse Health Advisor. No abnormalities found.          Immunizations  There is no immunization history on file for this patient.  TDAP status: Due, Education has been provided regarding the importance of this vaccine. Advised may receive this vaccine at local pharmacy or Health Dept. Aware to provide a copy of the vaccination record if obtained from local pharmacy or Health Dept. Verbalized acceptance and understanding.  Flu Vaccine status: Due, Education has been provided regarding the  importance of this vaccine. Advised may receive this vaccine at local pharmacy or Health Dept. Aware to provide a copy of the vaccination record if obtained from local pharmacy or Health Dept. Verbalized acceptance and understanding.  Pneumococcal vaccine status: Due, Education has been provided regarding the importance of this vaccine. Advised may receive this vaccine at local pharmacy or Health Dept. Aware to provide a copy of the vaccination record if obtained from local pharmacy or Health Dept. Verbalized acceptance and understanding.  Covid-19 vaccine status: Information provided on how to obtain vaccines.   Qualifies for Shingles Vaccine? Yes   Zostavax completed No   Shingrix Completed?: No.    Education has been provided regarding the importance of this vaccine. Patient has been advised to call insurance company to determine out of pocket expense if they have not yet received this vaccine. Advised may also receive vaccine at local pharmacy or Health Dept. Verbalized acceptance and understanding.  Screening Tests Health Maintenance  Topic Date Due   COVID-19 Vaccine (1) Never done   Zoster Vaccines- Shingrix (1 of 2) Never done   DEXA SCAN  Never done   INFLUENZA VACCINE  03/26/2021   Fecal DNA (Cologuard)  05/05/2021   TETANUS/TDAP  06/09/2021 (Originally 07/19/1970)   MAMMOGRAM  11/25/2022   Hepatitis C Screening  Completed   HPV VACCINES  Aged Out    Health Maintenance  Health Maintenance Due  Topic Date Due   COVID-19 Vaccine (1) Never done   Zoster Vaccines- Shingrix (1 of 2) Never done   DEXA SCAN  Never done   INFLUENZA VACCINE  03/26/2021   Fecal DNA (Cologuard)  05/05/2021   Colorectal cancer screening: Patient has Cologuard kit at home.  Mammogram status: Completed bilateral 11/24/2020. Repeat every year  Bone Density status: Ordered today. Pt provided with contact info and advised to call to schedule appt.  Lung Cancer Screening: (Low Dose CT Chest recommended  if Age 22-80 years, 30 pack-year currently smoking OR have quit w/in 15years.) does not qualify.     Additional Screening:  Hepatitis C Screening: Completed 03/10/2018  Vision Screening: Recommended annual ophthalmology exams for early detection of glaucoma and  other disorders of the eye. Is the patient up to date with their annual eye exam?  No  Who is the provider or what is the name of the office in which the patient attends annual eye exams? None-patient plans to make an appt soon  Dental Screening: Recommended annual dental exams for proper oral hygiene  Community Resource Referral / Chronic Care Management: CRR required this visit?  No   CCM required this visit?  No      Plan:     I have personally reviewed and noted the following in the patient's chart:   Medical and social history Use of alcohol, tobacco or illicit drugs  Current medications and supplements including opioid prescriptions. Patient is not currently taking opioid prescriptions. Functional ability and status Nutritional status Physical activity Advanced directives List of other physicians Hospitalizations, surgeries, and ER visits in previous 12 months Vitals Screenings to include cognitive, depression, and falls Referrals and appointments  In addition, I have reviewed and discussed with patient certain preventive protocols, quality metrics, and best practice recommendations. A written personalized care plan for preventive services as well as general preventive health recommendations were provided to patient.   Due to this being a telephonic visit, the after visit summary with patients personalized plan was offered to patient via mail or my-chart. Patient would like to access on my-chart.  Marta Antu, LPN   [id number]  Nurse Health Advsisor  Nurse Notes: None

## 2021-05-21 ENCOUNTER — Ambulatory Visit (INDEPENDENT_AMBULATORY_CARE_PROVIDER_SITE_OTHER): Payer: PPO

## 2021-05-21 VITALS — Ht 64.0 in | Wt 160.0 lb

## 2021-05-21 DIAGNOSIS — Z780 Asymptomatic menopausal state: Secondary | ICD-10-CM | POA: Diagnosis not present

## 2021-05-21 DIAGNOSIS — Z0000 Encounter for general adult medical examination without abnormal findings: Secondary | ICD-10-CM | POA: Diagnosis not present

## 2021-05-21 NOTE — Patient Instructions (Signed)
[name] , Thank you for taking time to complete your Medicare Wellness Visit. I appreciate your ongoing commitment to your health goals. Please review the following plan we discussed and let me know if I can assist you in the future.   Screening recommendations/referrals: Colonoscopy: Per our conversation, you have Cologuard kit at home Mammogram: Completed 11/24/2020-Due 11/24/2021 Bone Density: Ordered today. Someone will call you to schedule. Recommended yearly ophthalmology/optometry visit for glaucoma screening and checkup Recommended yearly dental visit for hygiene and checkup  Vaccinations: Influenza vaccine: Declined Pneumococcal vaccine: Declined Tdap vaccine: Declined  Shingles vaccine: Declined   Covid-19:Declined  Advanced directives: Declined information today.  Conditions/risks identified: See problem list  Next appointment: Follow up in one year for your annual wellness visit    Preventive Care 69 Years and Older, Female Preventive care refers to lifestyle choices and visits with your health care provider that can promote health and wellness. What does preventive care include? A yearly physical exam. This is also called an annual well check. Dental exams once or twice a year. Routine eye exams. Ask your health care provider how often you should have your eyes checked. Personal lifestyle choices, including: Daily care of your teeth and gums. Regular physical activity. Eating a healthy diet. Avoiding tobacco and drug use. Limiting alcohol use. Practicing safe sex. Taking low-dose aspirin every day. Taking vitamin and mineral supplements as recommended by your health care provider. What happens during an annual well check? The services and screenings done by your health care provider during your annual well check will depend on your age, overall health, lifestyle risk factors, and family history of disease. Counseling  Your health care provider may ask you  questions about your: Alcohol use. Tobacco use. Drug use. Emotional well-being. Home and relationship well-being. Sexual activity. Eating habits. History of falls. Memory and ability to understand (cognition). Work and work Statistician. Reproductive health. Screening  You may have the following tests or measurements: Height, weight, and BMI. Blood pressure. Lipid and cholesterol levels. These may be checked every 5 years, or more frequently if you are over 53 years old. Skin check. Lung cancer screening. You may have this screening every year starting at age 69 if you have a 30-pack-year history of smoking and currently smoke or have quit within the past 15 years. Fecal occult blood test (FOBT) of the stool. You may have this test every year starting at age 69. Flexible sigmoidoscopy or colonoscopy. You may have a sigmoidoscopy every 5 years or a colonoscopy every 10 years starting at age 69. Hepatitis C blood test. Hepatitis B blood test. Sexually transmitted disease (STD) testing. Diabetes screening. This is done by checking your blood sugar (glucose) after you have not eaten for a while (fasting). You may have this done every 1-3 years. Bone density scan. This is done to screen for osteoporosis. You may have this done starting at age 69. Mammogram. This may be done every 1-2 years. Talk to your health care provider about how often you should have regular mammograms. Talk with your health care provider about your test results, treatment options, and if necessary, the need for more tests. Vaccines  Your health care provider may recommend certain vaccines, such as: Influenza vaccine. This is recommended every year. Tetanus, diphtheria, and acellular pertussis (Tdap, Td) vaccine. You may need a Td booster every 10 years. Zoster vaccine. You may need this after age 69. Pneumococcal 13-valent conjugate (PCV13) vaccine. One dose is recommended after age 69. Pneumococcal polysaccharide  (  PPSV23) vaccine. One dose is recommended after age 69. Talk to your health care provider about which screenings and vaccines you need and how often you need them. This information is not intended to replace advice given to you by your health care provider. Make sure you discuss any questions you have with your health care provider. Document Released: 09/08/2015 Document Revised: 05/01/2016 Document Reviewed: 06/13/2015 Elsevier Interactive Patient Education  2017 [employer] Prevention in the Home Falls can cause injuries. They can happen to people of all ages. There are many things you can do to make your home safe and to help prevent falls. What can I do on the outside of my home? Regularly fix the edges of walkways and driveways and fix any cracks. Remove anything that might make you trip as you walk through a door, such as a raised step or threshold. Trim any bushes or trees on the path to your home. Use bright outdoor lighting. Clear any walking paths of anything that might make someone trip, such as rocks or tools. Regularly check to see if handrails are loose or broken. Make sure that both sides of any steps have handrails. Any raised decks and porches should have guardrails on the edges. Have any leaves, snow, or ice cleared regularly. Use sand or salt on walking paths during winter. Clean up any spills in your garage right away. This includes oil or grease spills. What can I do in the bathroom? Use night lights. Install grab bars by the toilet and in the tub and shower. Do not use towel bars as grab bars. Use non-skid mats or decals in the tub or shower. If you need to sit down in the shower, use a plastic, non-slip stool. Keep the floor dry. Clean up any water that spills on the floor as soon as it happens. Remove soap buildup in the tub or shower regularly. Attach bath mats securely with double-sided non-slip rug tape. Do not have throw rugs and other things on the  floor that can make you trip. What can I do in the bedroom? Use night lights. Make sure that you have a light by your bed that is easy to reach. Do not use any sheets or blankets that are too big for your bed. They should not hang down onto the floor. Have a firm chair that has side arms. You can use this for support while you get dressed. Do not have throw rugs and other things on the floor that can make you trip. What can I do in the kitchen? Clean up any spills right away. Avoid walking on wet floors. Keep items that you use a lot in easy-to-reach places. If you need to reach something above you, use a strong step stool that has a grab bar. Keep electrical cords out of the way. Do not use floor polish or wax that makes floors slippery. If you must use wax, use non-skid floor wax. Do not have throw rugs and other things on the floor that can make you trip. What can I do with my stairs? Do not leave any items on the stairs. Make sure that there are handrails on both sides of the stairs and use them. Fix handrails that are broken or loose. Make sure that handrails are as long as the stairways. Check any carpeting to make sure that it is firmly attached to the stairs. Fix any carpet that is loose or worn. Avoid having throw rugs at the top or bottom  of the stairs. If you do have throw rugs, attach them to the floor with carpet tape. Make sure that you have a light switch at the top of the stairs and the bottom of the stairs. If you do not have them, ask someone to add them for you. What else can I do to help prevent falls? Wear shoes that: Do not have high heels. Have rubber bottoms. Are comfortable and fit you well. Are closed at the toe. Do not wear sandals. If you use a stepladder: Make sure that it is fully opened. Do not climb a closed stepladder. Make sure that both sides of the stepladder are locked into place. Ask someone to hold it for you, if possible. Clearly mark and make  sure that you can see: Any grab bars or handrails. First and last steps. Where the edge of each step is. Use tools that help you move around (mobility aids) if they are needed. These include: Canes. Walkers. Scooters. Crutches. Turn on the lights when you go into a dark area. Replace any light bulbs as soon as they burn out. Set up your furniture so you have a clear path. Avoid moving your furniture around. If any of your floors are uneven, fix them. If there are any pets around you, be aware of where they are. Review your medicines with your doctor. Some medicines can make you feel dizzy. This can increase your chance of falling. Ask your doctor what other things that you can do to help prevent falls. This information is not intended to replace advice given to you by your health care provider. Make sure you discuss any questions you have with your health care provider. Document Released: 06/08/2009 Document Revised: 01/18/2016 Document Reviewed: 09/16/2014 Elsevier Interactive Patient Education  2017 [employer].

## 2021-05-23 ENCOUNTER — Telehealth (HOSPITAL_BASED_OUTPATIENT_CLINIC_OR_DEPARTMENT_OTHER): Payer: Self-pay

## 2021-05-30 ENCOUNTER — Other Ambulatory Visit: Payer: Self-pay

## 2021-05-30 ENCOUNTER — Ambulatory Visit (INDEPENDENT_AMBULATORY_CARE_PROVIDER_SITE_OTHER): Payer: PPO | Admitting: *Deleted

## 2021-05-30 DIAGNOSIS — E538 Deficiency of other specified B group vitamins: Secondary | ICD-10-CM | POA: Diagnosis not present

## 2021-05-30 MED ORDER — CYANOCOBALAMIN 1000 MCG/ML IJ SOLN
1000.0000 ug | Freq: Once | INTRAMUSCULAR | Status: AC
  Administered 2021-05-30: 1000 ug via INTRAMUSCULAR

## 2021-05-30 NOTE — Progress Notes (Signed)
Patient here for monthly b12 per Dr. Carollee Herter.    Her last b12 was in May.   Injection given in left deltoid and patient tolerated well.  Advised patient that she will need to keep appointments on time and keep follow ups to get blood work done.  Lab appointment made before next injection.

## 2021-06-04 ENCOUNTER — Other Ambulatory Visit: Payer: Self-pay

## 2021-06-04 ENCOUNTER — Ambulatory Visit (HOSPITAL_BASED_OUTPATIENT_CLINIC_OR_DEPARTMENT_OTHER)
Admission: RE | Admit: 2021-06-04 | Discharge: 2021-06-04 | Disposition: A | Discharge status: Home / Self Care | Payer: PPO | Source: Ambulatory Visit | Attending: Family Medicine | Admitting: Family Medicine

## 2021-06-04 DIAGNOSIS — M810 Age-related osteoporosis without current pathological fracture: Secondary | ICD-10-CM | POA: Diagnosis not present

## 2021-06-04 DIAGNOSIS — M8588 Other specified disorders of bone density and structure, other site: Secondary | ICD-10-CM | POA: Diagnosis not present

## 2021-06-04 DIAGNOSIS — Z780 Asymptomatic menopausal state: Secondary | ICD-10-CM | POA: Diagnosis not present

## 2021-06-07 ENCOUNTER — Other Ambulatory Visit: Payer: Self-pay

## 2021-06-07 DIAGNOSIS — M816 Localized osteoporosis [Lequesne]: Secondary | ICD-10-CM

## 2021-06-07 MED ORDER — ALENDRONATE SODIUM 70 MG PO TABS
70.0000 mg | ORAL_TABLET | ORAL | 3 refills | Status: DC
Start: 2021-06-07 — End: 2021-09-24

## 2021-06-07 MED ORDER — ALENDRONATE SODIUM 70 MG PO TABS: 70.0000 mg | ORAL_TABLET | ORAL | 11 refills | Status: DC

## 2021-06-25 ENCOUNTER — Ambulatory Visit: Payer: PPO | Admitting: Physician Assistant

## 2021-06-29 ENCOUNTER — Other Ambulatory Visit: Payer: Self-pay

## 2021-06-29 ENCOUNTER — Ambulatory Visit (INDEPENDENT_AMBULATORY_CARE_PROVIDER_SITE_OTHER): Payer: PPO | Admitting: *Deleted

## 2021-06-29 ENCOUNTER — Other Ambulatory Visit (INDEPENDENT_AMBULATORY_CARE_PROVIDER_SITE_OTHER): Payer: PPO

## 2021-06-29 DIAGNOSIS — E538 Deficiency of other specified B group vitamins: Secondary | ICD-10-CM | POA: Diagnosis not present

## 2021-06-29 LAB — VITAMIN B12: Vitamin B-12: 462 pg/mL (ref 211–911)

## 2021-06-29 MED ORDER — CYANOCOBALAMIN 1000 MCG/ML IJ SOLN
1000.0000 ug | Freq: Once | INTRAMUSCULAR | Status: AC
  Administered 2021-06-29: 1000 ug via INTRAMUSCULAR

## 2021-06-29 NOTE — Progress Notes (Signed)
Patient here for monthly b12 per Dr. Etter Sjogren.  She did get b12 labs drawn today.   Injection given in right deltoid and patient tolerated well.

## 2021-07-03 ENCOUNTER — Encounter: Payer: Self-pay | Admitting: Family Medicine

## 2021-07-03 DIAGNOSIS — Z1211 Encounter for screening for malignant neoplasm of colon: Secondary | ICD-10-CM | POA: Diagnosis not present

## 2021-07-10 LAB — COLOGUARD: COLOGUARD: NEGATIVE

## 2021-07-16 ENCOUNTER — Encounter: Payer: Self-pay | Admitting: Family Medicine

## 2021-07-16 ENCOUNTER — Telehealth (INDEPENDENT_AMBULATORY_CARE_PROVIDER_SITE_OTHER): Payer: PPO | Admitting: Family Medicine

## 2021-07-16 DIAGNOSIS — J111 Influenza due to unidentified influenza virus with other respiratory manifestations: Secondary | ICD-10-CM | POA: Diagnosis not present

## 2021-07-16 MED ORDER — OSELTAMIVIR PHOSPHATE 75 MG PO CAPS: 75.0000 mg | ORAL_CAPSULE | Freq: Two times a day (BID) | ORAL | 0 refills | Status: DC

## 2021-07-16 MED ORDER — PROMETHAZINE-DM 6.25-15 MG/5ML PO SYRP
5.0000 mL | ORAL_SOLUTION | Freq: Four times a day (QID) | ORAL | 0 refills | Status: DC | PRN
Start: 2021-07-16 — End: 2021-08-28

## 2021-07-16 NOTE — Progress Notes (Signed)
MyChart Video Visit    Virtual Visit via Video Note   This visit type was conducted due to national recommendations for restrictions regarding the COVID-19 Pandemic (e.g. social distancing) in an effort to limit this patient's exposure and mitigate transmission in our community. This patient is at least at moderate risk for complications without adequate follow up. This format is felt to be most appropriate for this patient at this time. Physical exam was limited by quality of the video and audio technology used for the visit. Alinda Dooms was able to get the patient set up on a video visit.  Patient location: home with husband Patient and provider in visit Provider location: Office  I discussed the limitations of evaluation and management by telemedicine and the availability of in person appointments. The patient expressed understanding and agreed to proceed.  Visit Date: 07/16/2021  Today's healthcare provider: Ann Held, DO     Subjective:    Patient ID: Dominique Gonzalez, female    DOB: August 30, 1950, 70 y.o.   MRN: [id number]  Chief Complaint  Patient presents with   Flu Gonzalez sxs    HPI Patient is in today for cough , body aches,   +sore throat   her husband is being treated for the flu.  No fever.    Past Medical History:  Diagnosis Date   Aortic insufficiency    mild   B12 deficiency    Cardiac murmur    Diastolic dysfunction    Fatigue    Hip pain, right    HTN (hypertension)    Hyperlipidemia    Leg pain, right    Osteopenia    Shoulder pain, left    Sinusitis, acute    NOS   URI (upper respiratory infection)     Past Surgical History:  Procedure Laterality Date   ABDOMINAL HYSTERECTOMY     age 55   BILATERAL OOPHORECTOMY      Family History  Problem Relation Age of Onset   Hypertension Father    Diabetes Father    Hypertension Mother    Breast cancer Mother     Social History   Socioeconomic History   Marital status:  Married    Spouse name: Not on file   Number of children: 2   Years of education: Not on file   Highest education level: Not on file  Occupational History   Not on file  Tobacco Use   Smoking status: Former    Types: Cigarettes    Quit date: 01/10/1996    Years since quitting: 25.5   Smokeless tobacco: Never   Tobacco comments:    smoked for 10 years,   Vaping Use   Vaping Use: Never used  Substance and Sexual Activity   Alcohol use: No   Drug use: No   Sexual activity: Not on file  Other Topics Concern   Not on file  Social History Narrative   Not on file   Social Determinants of Health   Financial Resource Strain: Low Risk    Difficulty of Paying Living Expenses: Not hard at all  Food Insecurity: No Food Insecurity   Worried About Charity fundraiser in the Last Year: Never true   Ran Out of Food in the Last Year: Never true  Transportation Needs: No Transportation Needs   Lack of Transportation (Medical): No   Lack of Transportation (Non-Medical): No  Physical Activity: Sufficiently Active   Days of Exercise per Week: 7 days  Minutes of Exercise per Session: 30 min  Stress: No Stress Concern Present   Feeling of Stress : Not at all  Social Connections: Moderately Isolated   Frequency of Communication with Friends and Family: More than three times a week   Frequency of Social Gatherings with Friends and Family: More than three times a week   Attends Religious Services: More than 4 times per year   Active Member of [employer] or Organizations: No   Attends Archivist Meetings: Never   Marital Status: Widowed  Human resources officer Violence: Not At Risk   Fear of Current or Ex-Partner: No   Emotionally Abused: No   Physically Abused: No   Sexually Abused: No    Outpatient Medications Prior to Visit  Medication Sig Dispense Refill   alendronate (FOSAMAX) 70 MG tablet Take 1 tablet (70 mg total) by mouth every 7 (seven) days. Take with a full glass of water on  an empty stomach. 12 tablet 3   aspirin 81 MG tablet Take 81 mg by mouth daily.       lisinopril-hydrochlorothiazide (ZESTORETIC) 10-12.5 MG tablet Take 1 tablet by mouth daily. 90 tablet 1   No facility-administered medications prior to visit.    Allergies  Allergen Reactions   Macrobid [Nitrofurantoin] Nausea And Vomiting    vomiting   Bactrim [Sulfamethoxazole-Trimethoprim] Hives    Review of Systems  Constitutional:  Negative for fever and malaise/fatigue.  HENT:  Positive for sore throat. Negative for congestion.   Eyes:  Negative for blurred vision.  Respiratory:  Positive for cough. Negative for shortness of breath.   Cardiovascular:  Negative for chest pain, palpitations and leg swelling.  Gastrointestinal:  Negative for abdominal pain, blood in stool and nausea.  Genitourinary:  Negative for dysuria and frequency.  Musculoskeletal:  Positive for myalgias. Negative for falls.  Skin:  Negative for rash.  Neurological:  Negative for dizziness, loss of consciousness and headaches.  Endo/Heme/Allergies:  Negative for environmental allergies.  Psychiatric/Behavioral:  Negative for depression. The patient is not nervous/anxious.       Objective:    Physical Exam Vitals and nursing note reviewed.  Constitutional:      Appearance: She is well-developed.  Eyes:     Conjunctiva/sclera: Conjunctivae normal.  Neck:     Thyroid: No thyromegaly.     Vascular: No JVD.  Pulmonary:     Effort: Pulmonary effort is normal.  Neurological:     Mental Status: She is alert and oriented to person, place, and time.    LMP  (LMP Unknown)  Wt Readings from Last 3 Encounters:  05/21/21 160 lb (72.6 kg)  06/19/20 167 lb 9.6 oz (76 kg)  06/09/20 167 lb 9.6 oz (76 kg)    Diabetic Foot Exam - Simple   No data filed    Lab Results  Component Value Date   WBC 6.9 07/19/2019   HGB 14.1 07/19/2019   HCT 41.1 07/19/2019   PLT 221.0 07/19/2019   GLUCOSE 110 (H) 12/13/2020   CHOL 167  12/13/2020   TRIG 84.0 12/13/2020   HDL 48.90 12/13/2020   LDLDIRECT 149.3 07/28/2007   LDLCALC 102 (H) 12/13/2020   ALT 14 12/13/2020   AST 19 12/13/2020   NA 138 12/13/2020   K 3.6 12/13/2020   CL 103 12/13/2020   CREATININE 0.78 12/13/2020   BUN 9 12/13/2020   CO2 26 12/13/2020   TSH 1.37 05/02/2015   HGBA1C 5.2 12/13/2020    Lab Results  Component  Value Date   TSH 1.37 05/02/2015   Lab Results  Component Value Date   WBC 6.9 07/19/2019   HGB 14.1 07/19/2019   HCT 41.1 07/19/2019   MCV 89.0 07/19/2019   PLT 221.0 07/19/2019   Lab Results  Component Value Date   NA 138 12/13/2020   K 3.6 12/13/2020   CO2 26 12/13/2020   GLUCOSE 110 (H) 12/13/2020   BUN 9 12/13/2020   CREATININE 0.78 12/13/2020   BILITOT 0.7 12/13/2020   ALKPHOS 90 12/13/2020   AST 19 12/13/2020   ALT 14 12/13/2020   PROT 6.6 12/13/2020   ALBUMIN 3.9 12/13/2020   CALCIUM 9.0 12/13/2020   GFR 78.05 12/13/2020   Lab Results  Component Value Date   CHOL 167 12/13/2020   Lab Results  Component Value Date   HDL 48.90 12/13/2020   Lab Results  Component Value Date   LDLCALC 102 (H) 12/13/2020   Lab Results  Component Value Date   TRIG 84.0 12/13/2020   Lab Results  Component Value Date   CHOLHDL 3 12/13/2020   Lab Results  Component Value Date   HGBA1C 5.2 12/13/2020       Assessment & Plan:   Problem List Items Addressed This Visit       Unprioritized   Influenza - Primary    tamiflu and cough med Drink plenty of fluids , rest  Tylenol prn  Call or rto prn      Relevant Medications   oseltamivir (TAMIFLU) 75 MG capsule   promethazine-dextromethorphan (PROMETHAZINE-DM) 6.25-15 MG/5ML syrup    I am having Dominique Gonzalez start on oseltamivir and promethazine-dextromethorphan. I am also having her maintain her aspirin, lisinopril-hydrochlorothiazide, and alendronate.  Meds ordered this encounter  Medications   oseltamivir (TAMIFLU) 75 MG capsule    Sig: Take 1  capsule (75 mg total) by mouth 2 (two) times daily.    Dispense:  10 capsule    Refill:  0   promethazine-dextromethorphan (PROMETHAZINE-DM) 6.25-15 MG/5ML syrup    Sig: Take 5 mLs by mouth 4 (four) times daily as needed.    Dispense:  118 mL    Refill:  0    I discussed the assessment and treatment plan with the patient. The patient was provided an opportunity to ask questions and all were answered. The patient agreed with the plan and demonstrated an understanding of the instructions.   The patient was advised to call back or seek an in-person evaluation if the symptoms worsen or if the condition fails to improve as anticipated.     Ann Held, DO [place] [facility] [phone] (phone) [phone] (fax)  [place]

## 2021-07-16 NOTE — Assessment & Plan Note (Signed)
tamiflu and cough med Drink plenty of fluids , rest  Tylenol prn  Call or rto prn

## 2021-08-02 ENCOUNTER — Ambulatory Visit: Payer: PPO

## 2021-08-09 ENCOUNTER — Encounter: Payer: Self-pay | Admitting: Family Medicine

## 2021-08-10 ENCOUNTER — Ambulatory Visit: Payer: PPO

## 2021-08-16 ENCOUNTER — Ambulatory Visit: Payer: PPO | Admitting: Family Medicine

## 2021-08-28 ENCOUNTER — Encounter: Payer: Self-pay | Admitting: Cardiovascular Disease

## 2021-08-28 ENCOUNTER — Encounter: Payer: Self-pay | Admitting: Family Medicine

## 2021-08-28 ENCOUNTER — Ambulatory Visit (INDEPENDENT_AMBULATORY_CARE_PROVIDER_SITE_OTHER): Payer: PPO | Admitting: Family Medicine

## 2021-08-28 VITALS — BP 120/74 | HR 72 | Temp 97.8°F | Resp 18 | Ht 64.0 in | Wt 165.0 lb

## 2021-08-28 DIAGNOSIS — I10 Essential (primary) hypertension: Secondary | ICD-10-CM

## 2021-08-28 DIAGNOSIS — M940 Chondrocostal junction syndrome [Tietze]: Secondary | ICD-10-CM

## 2021-08-28 DIAGNOSIS — E538 Deficiency of other specified B group vitamins: Secondary | ICD-10-CM

## 2021-08-28 MED ORDER — LISINOPRIL-HYDROCHLOROTHIAZIDE 10-12.5 MG PO TABS: 1.0000 | ORAL_TABLET | Freq: Every day | ORAL | 1 refills | Status: DC

## 2021-08-28 MED ORDER — CYANOCOBALAMIN 1000 MCG/ML IJ SOLN
1000.0000 ug | INTRAMUSCULAR | Status: DC
  Administered 2021-08-28: 1000 ug via INTRAMUSCULAR

## 2021-08-28 NOTE — Progress Notes (Signed)
Established Patient Office Visit  Subjective:  Patient ID: Dominique Gonzalez, female    DOB: 26-Dec-1950  Age: 71 y.o. MRN: [id number]  CC:  Chief Complaint  Patient presents with   Chest Pain    Left side, 3 months, on and off, and states worse when holding her purse or reaching for something. Pt states it feels like cramp. Pt reports no SOB, palps, dizziness, headache.    HPI Dominique Gonzalez presents for Left sided rib pain that radiates to the axilla.  No chest pain   it comes and goes and esp worsens when she picks things up and pushing on the ribs   no sob, no palpitations  Past Medical History:  Diagnosis Date   Aortic insufficiency    mild   B12 deficiency    Cardiac murmur    Diastolic dysfunction    Fatigue    Hip pain, right    HTN (hypertension)    Hyperlipidemia    Leg pain, right    Osteopenia    Shoulder pain, left    Sinusitis, acute    NOS   URI (upper respiratory infection)     Past Surgical History:  Procedure Laterality Date   ABDOMINAL HYSTERECTOMY     age 12   BILATERAL OOPHORECTOMY      Family History  Problem Relation Age of Onset   Hypertension Father    Diabetes Father    Hypertension Mother    Breast cancer Mother     Social History   Socioeconomic History   Marital status: Married    Spouse name: Not on file   Number of children: 2   Years of education: Not on file   Highest education level: Not on file  Occupational History   Not on file  Tobacco Use   Smoking status: Former    Types: Cigarettes    Quit date: 01/10/1996    Years since quitting: 25.6   Smokeless tobacco: Never   Tobacco comments:    smoked for 10 years,   Vaping Use   Vaping Use: Never used  Substance and Sexual Activity   Alcohol use: No   Drug use: No   Sexual activity: Not on file  Other Topics Concern   Not on file  Social History Narrative   Not on file   Social Determinants of Health   Financial Resource Strain: Low Risk    Difficulty of  Paying Living Expenses: Not hard at all  Food Insecurity: No Food Insecurity   Worried About Charity fundraiser in the Last Year: Never true   Ran Out of Food in the Last Year: Never true  Transportation Needs: No Transportation Needs   Lack of Transportation (Medical): No   Lack of Transportation (Non-Medical): No  Physical Activity: Sufficiently Active   Days of Exercise per Week: 7 days   Minutes of Exercise per Session: 30 min  Stress: No Stress Concern Present   Feeling of Stress : Not at all  Social Connections: Moderately Isolated   Frequency of Communication with Friends and Family: More than three times a week   Frequency of Social Gatherings with Friends and Family: More than three times a week   Attends Religious Services: More than 4 times per year   Active Member of [employer] or Organizations: No   Attends Archivist Meetings: Never   Marital Status: Widowed  Intimate Partner Violence: Not At Risk   Fear of Current or Ex-Partner: No  Emotionally Abused: No   Physically Abused: No   Sexually Abused: No    Outpatient Medications Prior to Visit  Medication Sig Dispense Refill   alendronate (FOSAMAX) 70 MG tablet Take 1 tablet (70 mg total) by mouth every 7 (seven) days. Take with a full glass of water on an empty stomach. 12 tablet 3   aspirin 81 MG tablet Take 81 mg by mouth daily.       lisinopril-hydrochlorothiazide (ZESTORETIC) 10-12.5 MG tablet Take 1 tablet by mouth daily. 90 tablet 1   oseltamivir (TAMIFLU) 75 MG capsule Take 1 capsule (75 mg total) by mouth 2 (two) times daily. (Patient not taking: Reported on 08/28/2021) 10 capsule 0   promethazine-dextromethorphan (PROMETHAZINE-DM) 6.25-15 MG/5ML syrup Take 5 mLs by mouth 4 (four) times daily as needed. (Patient not taking: Reported on 08/28/2021) 118 mL 0   No facility-administered medications prior to visit.    Allergies  Allergen Reactions   Macrobid [Nitrofurantoin] Nausea And Vomiting    vomiting    Bactrim [Sulfamethoxazole-Trimethoprim] Hives    ROS Review of Systems  Constitutional:  Negative for activity change, appetite change, fatigue and unexpected weight change.  Respiratory:  Negative for cough and shortness of breath.   Cardiovascular:  Negative for chest pain and palpitations.  Musculoskeletal:  Positive for arthralgias and myalgias. Negative for joint swelling.  Psychiatric/Behavioral:  Negative for behavioral problems and dysphoric mood. The patient is not nervous/anxious.      Objective:    Physical Exam Vitals and nursing note reviewed.  Constitutional:      Appearance: She is well-developed.  HENT:     Head: Normocephalic and atraumatic.  Eyes:     Conjunctiva/sclera: Conjunctivae normal.  Neck:     Thyroid: No thyromegaly.     Vascular: No carotid bruit or JVD.  Cardiovascular:     Rate and Rhythm: Normal rate and regular rhythm.     Heart sounds: Normal heart sounds. No murmur heard. Pulmonary:     Effort: Pulmonary effort is normal. No respiratory distress.     Breath sounds: Normal breath sounds. No decreased breath sounds, wheezing, rhonchi or rales.  Chest:     Chest wall: No tenderness.       Comments: Tenderness L sided ribs with palpation  Musculoskeletal:     Cervical back: Normal range of motion and neck supple.     Right lower leg: Tenderness present.     Left lower leg: Tenderness present.  Neurological:     Mental Status: She is alert and oriented to person, place, and time.    BP 120/74 (BP Location: Right Arm, Patient Position: Sitting, Cuff Size: Normal)    Pulse 72    Temp 97.8 F (36.6 C) (Oral)    Resp 18    Ht 5\' 4"  (1.626 m)    Wt 165 lb (74.8 kg)    LMP  (LMP Unknown)    SpO2 97%    BMI 28.32 kg/m  Wt Readings from Last 3 Encounters:  08/28/21 165 lb (74.8 kg)  05/21/21 160 lb (72.6 kg)  06/19/20 167 lb 9.6 oz (76 kg)     Health Maintenance Due  Topic Date Due   COVID-19 Vaccine (1) Never done   TETANUS/TDAP   Never done   Zoster Vaccines- Shingrix (1 of 2) Never done   Pneumonia Vaccine 38+ Years old (1 - PCV) Never done   INFLUENZA VACCINE  03/26/2021    There are no preventive care reminders to display  for this patient.  Lab Results  Component Value Date   TSH 1.37 05/02/2015   Lab Results  Component Value Date   WBC 6.9 07/19/2019   HGB 14.1 07/19/2019   HCT 41.1 07/19/2019   MCV 89.0 07/19/2019   PLT 221.0 07/19/2019   Lab Results  Component Value Date   NA 138 12/13/2020   K 3.6 12/13/2020   CO2 26 12/13/2020   GLUCOSE 110 (H) 12/13/2020   BUN 9 12/13/2020   CREATININE 0.78 12/13/2020   BILITOT 0.7 12/13/2020   ALKPHOS 90 12/13/2020   AST 19 12/13/2020   ALT 14 12/13/2020   PROT 6.6 12/13/2020   ALBUMIN 3.9 12/13/2020   CALCIUM 9.0 12/13/2020   GFR 78.05 12/13/2020   Lab Results  Component Value Date   CHOL 167 12/13/2020   Lab Results  Component Value Date   HDL 48.90 12/13/2020   Lab Results  Component Value Date   LDLCALC 102 (H) 12/13/2020   Lab Results  Component Value Date   TRIG 84.0 12/13/2020   Lab Results  Component Value Date   CHOLHDL 3 12/13/2020   Lab Results  Component Value Date   HGBA1C 5.2 12/13/2020      Assessment & Plan:   Problem List Items Addressed This Visit       Unprioritized   Costochondritis - Primary    Heat/ ice Tylenol prn  rto prn  Check xray       Relevant Orders   EKG 12-Lead (Completed)   DG Ribs Unilateral W/Chest Left   Essential hypertension    Well controlled, no changes to meds. Encouraged heart healthy diet such as the DASH diet and exercise as tolerated.       Relevant Medications   lisinopril-hydrochlorothiazide (ZESTORETIC) 10-12.5 MG tablet   Vitamin B12 deficiency    Vita b12 given today      Relevant Medications   cyanocobalamin ((VITAMIN B-12)) injection 1,000 mcg    Meds ordered this encounter  Medications   lisinopril-hydrochlorothiazide (ZESTORETIC) 10-12.5 MG tablet     Sig: Take 1 tablet by mouth daily.    Dispense:  90 tablet    Refill:  1   cyanocobalamin ((VITAMIN B-12)) injection 1,000 mcg    Follow-up: No follow-ups on file.    Ann Held, DO

## 2021-08-28 NOTE — Patient Instructions (Signed)
Costochondritis  Costochondritis is inflammation of the tissue (cartilage) that connects the ribs to the breastbone (sternum). This causes pain in the front of the chest. The pain usually starts slowlyand involves more than one rib. What are the causes? The exact cause of this condition is not always known. It results from stress on the cartilage where your ribs attach to your sternum. The cause of this stress could be: Chest injury. Exercise or activity, such as lifting. Severe coughing. What increases the risk? You are more likely to develop this condition if you: Are female. Are 30-40 years old. Recently started a new exercise or work activity. Have low levels of vitamin D. Have a condition that makes you cough frequently. What are the signs or symptoms? The main symptom of this condition is chest pain. The pain: Usually starts gradually and can be sharp or dull. Gets worse with deep breathing, coughing, or exercise. Gets better with rest. May be worse when you press on the affected area of your ribs and sternum. How is this diagnosed? This condition is diagnosed based on your symptoms, your medical history, and a physical exam. Your health care provider will check for pain when pressing on your sternum. You may also have tests to rule out other causes of chest pain. These may include: A chest X-ray to check for lung problems. An ECG (electrocardiogram) to see if you have a heart problem that could be causing the pain. An imaging scan to rule out a chest or rib fracture. How is this treated? This condition usually goes away on its own over time. Your health care provider may prescribe an NSAID, such as ibuprofen, to reduce pain and inflammation. Treatment may also include: Resting and avoiding activities that make pain worse. Applying heat or ice to the area to reduce pain and inflammation. Doing exercises to stretch your chest muscles. If these treatments do not help, your health  care provider may inject a numbingmedicine at the sternum-rib connection to help relieve the pain. Follow these instructions at home: Managing pain, stiffness, and swelling     If directed, put ice on the painful area. To do this: Put ice in a plastic bag. Place a towel between your skin and the bag. Leave the ice on for 20 minutes, 2-3 times a day. If directed, apply heat to the affected area as often as told by your health care provider. Use the heat source that your health care provider recommends, such as a moist heat pack or a heating pad. Place a towel between your skin and the heat source. Leave the heat on for 20-30 minutes. Remove the heat if your skin turns bright red. This is especially important if you are unable to feel pain, heat, or cold. You may have a greater risk of getting burned. Activity Rest as told by your health care provider. Avoid activities that make pain worse. This includes any activities that use chest, abdominal, and side muscles. Do not lift anything that is heavier than 10 lb (4.5 kg), or the limit that you are told, until your health care provider says that it is safe. Return to your normal activities as told by your health care provider. Ask your health care provider what activities are safe for you. General instructions Take over-the-counter and prescription medicines only as told by your health care provider. Keep all follow-up visits as told by your health care provider. This is important. Contact a health care provider if: You have chills or a   fever. Your pain does not go away or it gets worse. You have a cough that does not go away. Get help right away if: You have shortness of breath. You have severe chest pain that is not relieved by medicines, heat, or ice. These symptoms may represent a serious problem that is an emergency. Do not wait to see if the symptoms will go away. Get medical help right away. Call your local emergency services (911 in  the [place] Do not drive yourself to the hospital. Summary Costochondritis is inflammation of the tissue (cartilage) that connects the ribs to the breastbone (sternum). This condition causes pain in the front of the chest. Costochondritis results from stress on the cartilage where your ribs attach to your sternum. Treatment may include medicines, rest, heat or ice, and exercises. This information is not intended to replace advice given to you by your health care provider. Make sure you discuss any questions you have with your healthcare provider. Document Revised: 06/25/2019 Document Reviewed: 06/25/2019 Elsevier Patient Education  2022 [employer]  

## 2021-08-28 NOTE — Telephone Encounter (Signed)
I have not met pt before so will defer to MD whether to order pre-appt. Can evaluate further at [facility] as scheduled.

## 2021-08-29 ENCOUNTER — Other Ambulatory Visit: Payer: Self-pay

## 2021-08-29 ENCOUNTER — Ambulatory Visit (HOSPITAL_BASED_OUTPATIENT_CLINIC_OR_DEPARTMENT_OTHER)
Admission: RE | Admit: 2021-08-29 | Discharge: 2021-08-29 | Disposition: A | Discharge status: Home / Self Care | Payer: PPO | Source: Ambulatory Visit | Attending: Family Medicine | Admitting: Family Medicine

## 2021-08-29 DIAGNOSIS — M940 Chondrocostal junction syndrome [Tietze]: Secondary | ICD-10-CM | POA: Insufficient documentation

## 2021-08-29 DIAGNOSIS — R0781 Pleurodynia: Secondary | ICD-10-CM | POA: Diagnosis not present

## 2021-08-29 NOTE — Assessment & Plan Note (Signed)
Vita b12 given today

## 2021-08-29 NOTE — Assessment & Plan Note (Addendum)
Well controlled, no changes to meds. Encouraged heart healthy diet such as the DASH diet and exercise as tolerated.  °

## 2021-08-29 NOTE — Assessment & Plan Note (Signed)
Heat/ ice Tylenol prn  rto prn  Check xray

## 2021-09-13 ENCOUNTER — Encounter: Payer: Self-pay | Admitting: Family Medicine

## 2021-09-14 ENCOUNTER — Ambulatory Visit (INDEPENDENT_AMBULATORY_CARE_PROVIDER_SITE_OTHER): Payer: PPO | Admitting: Family Medicine

## 2021-09-14 ENCOUNTER — Encounter: Payer: Self-pay | Admitting: Family Medicine

## 2021-09-14 VITALS — BP 128/78 | HR 82 | Temp 98.1°F | Resp 16 | Ht 64.0 in | Wt 165.2 lb

## 2021-09-14 DIAGNOSIS — R350 Frequency of micturition: Secondary | ICD-10-CM | POA: Diagnosis not present

## 2021-09-14 LAB — POC URINALSYSI DIPSTICK (AUTOMATED)
Blood, UA: NEGATIVE
Glucose, UA: NEGATIVE
Ketones, UA: NEGATIVE
Leukocytes, UA: NEGATIVE
Nitrite, UA: NEGATIVE
Protein, UA: NEGATIVE
Spec Grav, UA: 1.01 (ref 1.010–1.025)
Urobilinogen, UA: 0.2 E.U./dL
pH, UA: 7 (ref 5.0–8.0)

## 2021-09-14 MED ORDER — CEPHALEXIN 500 MG PO CAPS: 500.0000 mg | ORAL_CAPSULE | Freq: Two times a day (BID) | ORAL | 0 refills | Status: DC

## 2021-09-14 NOTE — Patient Instructions (Signed)
Dysuria ?Dysuria is pain or discomfort during urination. The pain or discomfort may be felt in the part of the body that drains urine from the bladder (urethra) or in the surrounding tissue of the genitals. The pain may also be felt in the groin area, lower abdomen, or lower back. ?You may have to urinate frequently or have the sudden feeling that you have to urinate (urgency). Dysuria can affect anyone, but it is more common in females. Dysuria can be caused by many different things, including: ?Urinary tract infection. ?Kidney stones or bladder stones. ?Certain STIs (sexually transmitted infections), such as chlamydia. ?Dehydration. ?Inflammation of the tissues of the vagina. ?Use of certain medicines. ?Use of certain soaps or scented products that cause irritation. ?Follow these instructions at home: ?Medicines ?Take over-the-counter and prescription medicines only as told by your health care provider. ?If you were prescribed an antibiotic medicine, take it as told by your health care provider. Do not stop taking the antibiotic even if you start to feel better. ?Eating and drinking ? ?Drink enough fluid to keep your urine pale yellow. ?Avoid caffeinated beverages, tea, and alcohol. These beverages can irritate the bladder and make dysuria worse. In males, alcohol may irritate the prostate. ?General instructions ?Watch your condition for any changes. ?Urinate often. Avoid holding urine for long periods of time. ?If you are female, you should wipe from front to back after urinating or having a bowel movement. Use each piece of toilet paper only once. ?Empty your bladder after sex. ?Keep all follow-up visits. This is important. ?If you had any tests done to find the cause of dysuria, it is up to you to get your test results. Ask your health care provider, or the department that is doing the test, when your results will be ready. ?Contact a health care provider if: ?You have a fever. ?You develop pain in your back or  sides. ?You have nausea or vomiting. ?You have blood in your urine. ?You are not urinating as often as you usually do. ?Get help right away if: ?Your pain is severe and not relieved with medicines. ?You cannot eat or drink without vomiting. ?You are confused. ?You have a rapid heartbeat while resting. ?You have shaking or chills. ?You feel extremely weak. ?Summary ?Dysuria is pain or discomfort while urinating. Many different conditions can lead to dysuria. ?If you have dysuria, you may have to urinate frequently or have the sudden feeling that you have to urinate (urgency). ?Watch your condition for any changes. Keep all follow-up visits. ?Make sure that you urinate often and drink enough fluid to keep your urine pale yellow. ?This information is not intended to replace advice given to you by your health care provider. Make sure you discuss any questions you have with your health care provider. ?Document Revised: 03/24/2020 Document Reviewed: 03/24/2020 ?Elsevier Patient Education ? 2022 Elsevier Inc. ? ?

## 2021-09-14 NOTE — Progress Notes (Signed)
Subjective:   By signing my name below, I, Zite Okoli, attest that this documentation has been prepared under the direction and in the presence of Ann Held, DO. 09/14/2021    Patient ID: Dominique Gonzalez, female    DOB: December 13, 1950, 71 y.o.   MRN: [id number]  Chief Complaint  Patient presents with   Back Pain    Pt states having low back pain and urinary freq for 6 days.     HPI Patient is in today for an office visit.  She reports symptoms of frequency and mild dysuria for the past 6 days associated with low back pain. She tried OTC UTI tests and they were positive. She has used Advil only to manage the pain and has been drinking plenty of water.  Urinalysis was negative.   Past Medical History:  Diagnosis Date   Aortic insufficiency    mild   B12 deficiency    Cardiac murmur    Diastolic dysfunction    Fatigue    Hip pain, right    HTN (hypertension)    Hyperlipidemia    Leg pain, right    Osteopenia    Shoulder pain, left    Sinusitis, acute    NOS   URI (upper respiratory infection)     Past Surgical History:  Procedure Laterality Date   ABDOMINAL HYSTERECTOMY     age 53   BILATERAL OOPHORECTOMY      Family History  Problem Relation Age of Onset   Hypertension Father    Diabetes Father    Hypertension Mother    Breast cancer Mother     Social History   Socioeconomic History   Marital status: Married    Spouse name: Not on file   Number of children: 2   Years of education: Not on file   Highest education level: Not on file  Occupational History   Not on file  Tobacco Use   Smoking status: Former    Types: Cigarettes    Quit date: 01/10/1996    Years since quitting: 25.6   Smokeless tobacco: Never   Tobacco comments:    smoked for 10 years,   Vaping Use   Vaping Use: Never used  Substance and Sexual Activity   Alcohol use: No   Drug use: No   Sexual activity: Not on file  Other Topics Concern   Not on file  Social History  Narrative   Not on file   Social Determinants of Health   Financial Resource Strain: Low Risk    Difficulty of Paying Living Expenses: Not hard at all  Food Insecurity: No Food Insecurity   Worried About Charity fundraiser in the Last Year: Never true   Ran Out of Food in the Last Year: Never true  Transportation Needs: No Transportation Needs   Lack of Transportation (Medical): No   Lack of Transportation (Non-Medical): No  Physical Activity: Sufficiently Active   Days of Exercise per Week: 7 days   Minutes of Exercise per Session: 30 min  Stress: No Stress Concern Present   Feeling of Stress : Not at all  Social Connections: Moderately Isolated   Frequency of Communication with Friends and Family: More than three times a week   Frequency of Social Gatherings with Friends and Family: More than three times a week   Attends Religious Services: More than 4 times per year   Active Member of [employer] or Organizations: No   Attends Archivist  Meetings: Never   Marital Status: Widowed  Human resources officer Violence: Not At Risk   Fear of Current or Ex-Partner: No   Emotionally Abused: No   Physically Abused: No   Sexually Abused: No    Outpatient Medications Prior to Visit  Medication Sig Dispense Refill   alendronate (FOSAMAX) 70 MG tablet Take 1 tablet (70 mg total) by mouth every 7 (seven) days. Take with a full glass of water on an empty stomach. 12 tablet 3   aspirin 81 MG tablet Take 81 mg by mouth daily.       lisinopril-hydrochlorothiazide (ZESTORETIC) 10-12.5 MG tablet Take 1 tablet by mouth daily. 90 tablet 1   Facility-Administered Medications Prior to Visit  Medication Dose Route Frequency Provider Last Rate Last Admin   cyanocobalamin ((VITAMIN B-12)) injection 1,000 mcg  1,000 mcg Intramuscular Q30 days Carollee Herter, Milus Fritze R, DO   1,000 mcg at 08/28/21 1551    Allergies  Allergen Reactions   Macrobid [Nitrofurantoin] Nausea And Vomiting    vomiting   Bactrim  [Sulfamethoxazole-Trimethoprim] Hives    Review of Systems  Constitutional:  Negative for fever.  HENT:  Negative for congestion, ear pain, hearing loss, sinus pain and sore throat.   Eyes:  Negative for blurred vision and pain.  Respiratory:  Negative for cough, sputum production, shortness of breath and wheezing.   Cardiovascular:  Negative for chest pain and palpitations.  Gastrointestinal:  Negative for blood in stool, constipation, diarrhea, nausea and vomiting.  Genitourinary:  Positive for dysuria and frequency. Negative for hematuria and urgency.  Musculoskeletal:  Positive for back pain (lower). Negative for falls and myalgias.  Neurological:  Negative for dizziness, sensory change, loss of consciousness, weakness and headaches.  Endo/Heme/Allergies:  Negative for environmental allergies. Does not bruise/bleed easily.  Psychiatric/Behavioral:  Negative for depression and suicidal ideas. The patient is not nervous/anxious and does not have insomnia.       Objective:    Physical Exam Vitals and nursing note reviewed.  Constitutional:      General: She is not in acute distress.    Appearance: Normal appearance. She is well-developed. She is not ill-appearing.  HENT:     Head: Normocephalic and atraumatic.     Right Ear: External ear normal.     Left Ear: External ear normal.  Eyes:     Extraocular Movements: Extraocular movements intact.     Conjunctiva/sclera: Conjunctivae normal.     Pupils: Pupils are equal, round, and reactive to light.  Neck:     Thyroid: No thyromegaly.     Vascular: No carotid bruit or JVD.  Cardiovascular:     Rate and Rhythm: Normal rate and regular rhythm.     Pulses: Normal pulses.     Heart sounds: Normal heart sounds. No murmur heard.   No gallop.  Pulmonary:     Effort: Pulmonary effort is normal. No respiratory distress.     Breath sounds: Normal breath sounds. No wheezing, rhonchi or rales.  Chest:     Chest wall: No tenderness.   Abdominal:     General: Bowel sounds are normal. There is no distension.     Palpations: Abdomen is soft. There is no mass.     Tenderness: There is no abdominal tenderness. There is no guarding or rebound.     Hernia: No hernia is present.  Musculoskeletal:     Cervical back: Normal range of motion and neck supple.     Comments: Back- no tenderness on palpation  Lymphadenopathy:     Cervical: No cervical adenopathy.  Skin:    General: Skin is warm and dry.  Neurological:     Mental Status: She is alert and oriented to person, place, and time.  Psychiatric:        Behavior: Behavior normal.    BP 128/78 (BP Location: Right Arm, Patient Position: Sitting, Cuff Size: Normal)    Pulse 82    Temp 98.1 F (36.7 C) (Oral)    Resp 16    Ht 5\' 4"  (1.626 m)    Wt 165 lb 3.2 oz (74.9 kg)    LMP  (LMP Unknown)    SpO2 97%    BMI 28.36 kg/m  Wt Readings from Last 3 Encounters:  09/14/21 165 lb 3.2 oz (74.9 kg)  08/28/21 165 lb (74.8 kg)  05/21/21 160 lb (72.6 kg)    Diabetic Foot Exam - Simple   No data filed    Lab Results  Component Value Date   WBC 6.9 07/19/2019   HGB 14.1 07/19/2019   HCT 41.1 07/19/2019   PLT 221.0 07/19/2019   GLUCOSE 110 (H) 12/13/2020   CHOL 167 12/13/2020   TRIG 84.0 12/13/2020   HDL 48.90 12/13/2020   LDLDIRECT 149.3 07/28/2007   LDLCALC 102 (H) 12/13/2020   ALT 14 12/13/2020   AST 19 12/13/2020   NA 138 12/13/2020   K 3.6 12/13/2020   CL 103 12/13/2020   CREATININE 0.78 12/13/2020   BUN 9 12/13/2020   CO2 26 12/13/2020   TSH 1.37 05/02/2015   HGBA1C 5.2 12/13/2020    Lab Results  Component Value Date   TSH 1.37 05/02/2015   Lab Results  Component Value Date   WBC 6.9 07/19/2019   HGB 14.1 07/19/2019   HCT 41.1 07/19/2019   MCV 89.0 07/19/2019   PLT 221.0 07/19/2019   Lab Results  Component Value Date   NA 138 12/13/2020   K 3.6 12/13/2020   CO2 26 12/13/2020   GLUCOSE 110 (H) 12/13/2020   BUN 9 12/13/2020   CREATININE 0.78  12/13/2020   BILITOT 0.7 12/13/2020   ALKPHOS 90 12/13/2020   AST 19 12/13/2020   ALT 14 12/13/2020   PROT 6.6 12/13/2020   ALBUMIN 3.9 12/13/2020   CALCIUM 9.0 12/13/2020   GFR 78.05 12/13/2020   Lab Results  Component Value Date   CHOL 167 12/13/2020   Lab Results  Component Value Date   HDL 48.90 12/13/2020   Lab Results  Component Value Date   LDLCALC 102 (H) 12/13/2020   Lab Results  Component Value Date   TRIG 84.0 12/13/2020   Lab Results  Component Value Date   CHOLHDL 3 12/13/2020   Lab Results  Component Value Date   HGBA1C 5.2 12/13/2020       Assessment & Plan:   Problem List Items Addressed This Visit       Unprioritized   Urinary frequency - Primary    Keflex bid x 7 days  Urine culture pending  UA normal but culture done due to symptoms       Relevant Medications   cephALEXin (KEFLEX) 500 MG capsule   Other Relevant Orders   POCT Urinalysis Dipstick (Automated) (Completed)   Urine Culture    Meds ordered this encounter  Medications   cephALEXin (KEFLEX) 500 MG capsule    Sig: Take 1 capsule (500 mg total) by mouth 2 (two) times daily.    Dispense:  14 capsule  Refill:  0    I,Zite Okoli,acting as a Education administrator for [employer], DO.,have documented all relevant documentation on the behalf of Ann Held, DO,as directed by  Ann Held, DO while in the presence of Ann Held, DO.   I, Ann Held, DO., personally preformed the services described in this documentation.  All medical record entries made by the scribe were at my direction and in my presence.  I have reviewed the chart and discharge instructions (if applicable) and agree that the record reflects my personal performance and is accurate and complete. 09/14/2021

## 2021-09-14 NOTE — Assessment & Plan Note (Signed)
Keflex bid x 7 days  Urine culture pending  UA normal but culture done due to symptoms

## 2021-09-15 LAB — URINE CULTURE
MICRO NUMBER:: 12898314
SPECIMEN QUALITY:: ADEQUATE

## 2021-09-20 NOTE — Progress Notes (Signed)
Cardiology Office Note    Date:  09/24/2021   ID:  Dominique Gonzalez, Dominique Gonzalez 1950/11/06, MRN [id number]  PCP:  Carollee Herter, Alferd Apa, DO  Cardiologist:  Lauree Chandler, MD  Electrophysiologist:  None   Chief Complaint: f/u AI; chest pain  History of Present Illness:   Dominique Gonzalez is a 71 y.o. female with history of essential HTN, hyperlipidemia (followed by primary care), aortic insufficiency who is seen back for cardiac follow-up. She has followed with [place] since 2011 when she was found to have mild AI on echo. At that time echo also suggested possible epicardial mass/aortic root mass. Cardiac MRI showed prominent epicardial fat pad with no evidence of aortic or pericardial tumor or mass. Last echo 01/2020 showed EF 60-65%, grade 1 DD, trivial AI. No other significant abnormalities.  She is seen for routine cardiac follow-up today. She also reports for the last 6 months she has been having intermittent discomfort under her left breast and left arm when she carries groceries in with that arm, lifts that arm, or lays on that side. The discomfort can be reproducible with palpation when it is happening. She does not feel this at rest. No associated SOB, palpitations, syncope. She saw PCP for this on 08/28/21. Plain rib film was negative. This was felt c/w costochondritis and supportive care was recommended.    Labwork independently reviewed: 11/2020 LDL 102, K 3.6, Cr 0.78, LFTs wnl, A1C 5.2 06/2019 CBC wnl  Cardiology Studies:   Studies reviewed are outlined and summarized above. Reports included below if pertinent.   2d Echo 01/2020   1. Left ventricular ejection fraction, by estimation, is 60 to 65%. The  left ventricle has normal function. The left ventricle has no regional  wall motion abnormalities. Left ventricular diastolic parameters are  consistent with Grade I diastolic  dysfunction (impaired relaxation).   2. Right ventricular systolic function is normal. The right  ventricular  size is normal.   3. The mitral valve is normal in structure. No evidence of mitral valve  regurgitation. No evidence of mitral stenosis.   4. The aortic valve is normal in structure. Aortic valve regurgitation is  trivial. No aortic stenosis is present.   [facility] 12/2009 MR CARDIA MORPHOLOGY WITHOUT CONTRAST     GE 1.5 T magnet with dedicated cardiac coil.  FIESTA sequences for  function and morphology.  The patient was claustrophobic and had to  stop the examination after limited sequences were done.  No  contrast was given.     Comparison: None.     Findings: There was very prominent epicardial fat encasing the  heart.  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  The left  ventricle was normal in size and systolic function with calculated  EF 56%.  There were no regional wall motion abnormalities.  The  right ventricle was normal in size and systolic function.  The  atria were normal in size.  The aortic valve was incompletely  visualized due to premature termination of the exam but there was  mild aortic insufficiency noted.  The aorta was not well visualized  due to premature termination of the exam but limited views of the  ascending aorta did not show an abnormality.     IMPRESSION:  1. Premature termination of the exam due to claustrophobia with  minimal sequences obtained.     2. Prominent epicardial fat encasing the heart.  No pericardial  mass noted.  There was also lipomatous atrial septal hypertrophy.  3. Normal LV and RV size and systolic function.     4. Only limited views of the ascending aorta were obtained.  There  was no abnormality seen on those limited views.  If the ascending  aorta remains a concern, suggest alternative imaging modality such  as CT angiogram.   Provider: Gwendel Hanson    Past Medical History:  Diagnosis Date   Aortic insufficiency    mild   B12 deficiency    Cardiac murmur    Diastolic dysfunction    Fatigue     Hip pain, right    HTN (hypertension)    Hyperlipidemia    Leg pain, right    Osteopenia    Shoulder pain, left    Sinusitis, acute    NOS   URI (upper respiratory infection)     Past Surgical History:  Procedure Laterality Date   ABDOMINAL HYSTERECTOMY     age 18   BILATERAL OOPHORECTOMY      Current Medications: Current Meds  Medication Sig   aspirin 81 MG tablet Take 81 mg by mouth daily.     lisinopril-hydrochlorothiazide (ZESTORETIC) 10-12.5 MG tablet Take 1 tablet by mouth daily.    Allergies:   Macrobid [nitrofurantoin] and Bactrim [sulfamethoxazole-trimethoprim]   Social History   Socioeconomic History   Marital status: Married    Spouse name: Not on file   Number of children: 2   Years of education: Not on file   Highest education level: Not on file  Occupational History   Not on file  Tobacco Use   Smoking status: Former    Types: Cigarettes    Quit date: 01/10/1996    Years since quitting: 25.7   Smokeless tobacco: Never   Tobacco comments:    smoked for 10 years,   Vaping Use   Vaping Use: Never used  Substance and Sexual Activity   Alcohol use: No   Drug use: No   Sexual activity: Not on file  Other Topics Concern   Not on file  Social History Narrative   Not on file   Social Determinants of Health   Financial Resource Strain: Low Risk    Difficulty of Paying Living Expenses: Not hard at all  Food Insecurity: No Food Insecurity   Worried About Charity fundraiser in the Last Year: Never true   [place] in the Last Year: Never true  Transportation Needs: No Transportation Needs   Lack of Transportation (Medical): No   Lack of Transportation (Non-Medical): No  Physical Activity: Sufficiently Active   Days of Exercise per Week: 7 days   Minutes of Exercise per Session: 30 min  Stress: No Stress Concern Present   Feeling of Stress : Not at all  Social Connections: Moderately Isolated   Frequency of Communication with Friends  and Family: More than three times a week   Frequency of Social Gatherings with Friends and Family: More than three times a week   Attends Religious Services: More than 4 times per year   Active Member of [employer] or Organizations: No   Attends Archivist Meetings: Never   Marital Status: Widowed     Family History:  The patient's family history includes Breast cancer in her mother; Diabetes in her father; Hypertension in her father and mother.  ROS:   Please see the history of present illness.  All other systems are reviewed and otherwise negative.    EKG(s)/Additional Labs   EKG:  EKG is  not ordered today but personally reviewed recent tracing 08/28/21 demonstrating NSR 81bpm, left axis deviation without acute change from prior  Recent Labs: 12/13/2020: ALT 14; BUN 9; Creatinine, Ser 0.78; Potassium 3.6; Sodium 138  Recent Lipid Panel    Component Value Date/Time   CHOL 167 12/13/2020 0841   TRIG 84.0 12/13/2020 0841   HDL 48.90 12/13/2020 0841   CHOLHDL 3 12/13/2020 0841   VLDL 16.8 12/13/2020 0841   LDLCALC 102 (H) 12/13/2020 0841   LDLCALC 101 (H) 06/09/2020 1121   LDLDIRECT 149.3 07/28/2007 0000    PHYSICAL EXAM:    VS:  BP 120/68 (BP Location: Left Arm, Patient Position: Sitting, Cuff Size: Normal)    Pulse 73    Ht 5\' 4"  (1.626 m)    Wt 164 lb 12.8 oz (74.8 kg)    LMP  (LMP Unknown)    SpO2 99%    BMI 28.29 kg/m   BMI: Body mass index is 28.29 kg/m.  GEN: Well nourished, well developed female in no acute distress HEENT: normocephalic, atraumatic Neck: no JVD, carotid bruits, or masses Cardiac: RRR; no murmurs, rubs, or gallops, no edema  Respiratory:  clear to auscultation bilaterally, normal work of breathing GI: soft, nontender, nondistended, + BS MS: no deformity or atrophy Skin: warm and dry, no rash Neuro:  Alert and Oriented x 3, Strength and sensation are intact, follows commands Psych: euthymic mood, full affect  Wt Readings from Last 3  Encounters:  09/24/21 164 lb 12.8 oz (74.8 kg)  09/14/21 165 lb 3.2 oz (74.9 kg)  08/28/21 165 lb (74.8 kg)     ASSESSMENT & PLAN:   1. Chest pain, atypical - features primarily consistent with MSK etiology. Evaluated by PCP as well. Recent EKG reviewed and generally without acute change from prior. She does have a family history of CAD in both mother and father. We discussed further CAD r/o testing including ETT, nuc, and cor CT. She does not want to proceed with any of these at this time. She is requesting to have an echocardiogram only at this time. We will arrange. We did discuss that this does not specifically exclude heart blockages but will give [place] the appearance of the pumping function/valves. Will also update basic labs (CBC, BMET, TSH) to exclude any significant metabolic changes. She is aware to call us if she changes her mind about ischemic testing. We also reviewed warning sx.  2. Essential HTN - controlled on lisinopril/HCTZ, no changes made today. F/u BMET.  3. Aortic insufficiency - has been mild and trivial over the years. Will repeat echocardiogram to trend. She has a soft systolic murmur on exam.  4. Diastolic dysfunction without heart failure - previously noted on echo. Continue blood pressure control. No s/sx of heart failure at this time.     Disposition: F/u with Dr. Angelena Form in 1 year.   Medication Adjustments/Labs and Tests Ordered: Current medicines are reviewed at length with the patient today.  Concerns regarding medicines are outlined above. Medication changes, Labs and Tests ordered today are summarized above and listed in the Patient Instructions accessible in Encounters.   Signed, Charlie Pitter, PA-C  09/24/2021 1:44 PM    [facility] Phone: [phone]; Fax: [phone]

## 2021-09-24 ENCOUNTER — Encounter: Payer: Self-pay | Admitting: Physician Assistant

## 2021-09-24 ENCOUNTER — Ambulatory Visit: Payer: PPO | Admitting: Physician Assistant

## 2021-09-24 ENCOUNTER — Other Ambulatory Visit: Payer: Self-pay

## 2021-09-24 VITALS — BP 120/68 | HR 73 | Ht 64.0 in | Wt 164.8 lb

## 2021-09-24 DIAGNOSIS — I5189 Other ill-defined heart diseases: Secondary | ICD-10-CM

## 2021-09-24 DIAGNOSIS — I351 Nonrheumatic aortic (valve) insufficiency: Secondary | ICD-10-CM

## 2021-09-24 DIAGNOSIS — R0789 Other chest pain: Secondary | ICD-10-CM

## 2021-09-24 DIAGNOSIS — I10 Essential (primary) hypertension: Secondary | ICD-10-CM

## 2021-09-24 NOTE — Patient Instructions (Addendum)
Medication Instructions:  Your physician recommends that you continue on your current medications as directed. Please refer to the Current Medication list given to you today.  *If you need a refill on your cardiac medications before your next appointment, please call your pharmacy*   Lab Work: TODAY:  BMET, CBC, & TSH   If you have labs (blood work) drawn today and your tests are completely normal, you will receive your results only by: [place] (if you have MyChart) OR A paper copy in the mail If you have any lab test that is abnormal or we need to change your treatment, we will call you to review the results.   Testing/Procedures: Your physician has requested that you have an echocardiogram. Echocardiography is a painless test that uses sound waves to create images of your heart. It provides your doctor with information about the size and shape of your heart and how well your hearts chambers and valves are working. This procedure takes approximately one hour. There are no restrictions for this procedure.    Follow-Up: At [facility], you and your health needs are our priority.  As part of our continuing mission to provide you with exceptional heart care, we have created designated Provider Care Teams.  These Care Teams include your primary Cardiologist (physician) and Advanced Practice Providers (APPs -  Physician Assistants and Nurse Practitioners) who all work together to provide you with the care you need, when you need it.  We recommend signing up for the patient portal called "MyChart".  Sign up information is provided on this After Visit Summary.  MyChart is used to connect with patients for Virtual Visits (Telemedicine).  Patients are able to view lab/test results, encounter notes, upcoming appointments, etc.  Non-urgent messages can be sent to your provider as well.   To learn more about what you can do with MyChart, go to [email or web address].    Your next  appointment:   12 month(s)  The format for your next appointment:   In Person  Provider:   Lauree Chandler, MD  or Melina Copa, PA-C         Other Instructions

## 2021-09-25 LAB — CBC
Hematocrit: 40.5 % (ref 34.0–46.6)
Hemoglobin: 14 g/dL (ref 11.1–15.9)
MCH: 30.3 pg (ref 26.6–33.0)
MCHC: 34.6 g/dL (ref 31.5–35.7)
MCV: 88 fL (ref 79–97)
Platelets: 245 10*3/uL (ref 150–450)
RBC: 4.62 x10E6/uL (ref 3.77–5.28)
RDW: 12.9 % (ref 11.7–15.4)
WBC: 5.9 10*3/uL (ref 3.4–10.8)

## 2021-09-25 LAB — BASIC METABOLIC PANEL
BUN/Creatinine Ratio: 11 — ABNORMAL LOW (ref 12–28)
BUN: 8 mg/dL (ref 8–27)
CO2: 22 mmol/L (ref 20–29)
Calcium: 9.3 mg/dL (ref 8.7–10.3)
Chloride: 103 mmol/L (ref 96–106)
Creatinine, Ser: 0.75 mg/dL (ref 0.57–1.00)
Glucose: 99 mg/dL (ref 70–99)
Potassium: 3.9 mmol/L (ref 3.5–5.2)
Sodium: 141 mmol/L (ref 134–144)
eGFR: 86 mL/min/{1.73_m2} (ref >59)

## 2021-09-25 LAB — TSH: TSH: 1.3 u[IU]/mL (ref 0.450–4.500)

## 2021-10-09 ENCOUNTER — Other Ambulatory Visit (HOSPITAL_COMMUNITY): Payer: PPO

## 2021-10-09 ENCOUNTER — Other Ambulatory Visit: Payer: Self-pay

## 2021-10-09 ENCOUNTER — Ambulatory Visit (HOSPITAL_COMMUNITY): Payer: PPO | Attending: Cardiology

## 2021-10-09 DIAGNOSIS — R0789 Other chest pain: Secondary | ICD-10-CM | POA: Insufficient documentation

## 2021-10-09 LAB — ECHOCARDIOGRAM COMPLETE
Area-P 1/2: 3.7 cm2
P 1/2 time: 484 msec
S' Lateral: 3 cm

## 2021-10-10 NOTE — Progress Notes (Signed)
Pt has been made aware of normal result and verbalized understanding.  jw

## 2021-10-11 ENCOUNTER — Ambulatory Visit: Payer: PPO

## 2021-10-15 ENCOUNTER — Ambulatory Visit (INDEPENDENT_AMBULATORY_CARE_PROVIDER_SITE_OTHER): Payer: PPO | Admitting: *Deleted

## 2021-10-15 DIAGNOSIS — E538 Deficiency of other specified B group vitamins: Secondary | ICD-10-CM | POA: Diagnosis not present

## 2021-10-15 MED ORDER — CYANOCOBALAMIN 1000 MCG/ML IJ SOLN
1000.0000 ug | Freq: Once | INTRAMUSCULAR | Status: AC
  Administered 2021-10-15: 1000 ug via INTRAMUSCULAR

## 2021-10-15 NOTE — Progress Notes (Addendum)
Patient here for monthly b12 per Dr. Etter Sjogren- Cheri Rous.   Injection given in right deltoid and patient tolerated well.

## 2021-10-24 ENCOUNTER — Encounter: Payer: Self-pay | Admitting: Cardiovascular Disease

## 2021-11-09 ENCOUNTER — Ambulatory Visit (INDEPENDENT_AMBULATORY_CARE_PROVIDER_SITE_OTHER): Payer: PPO | Admitting: Family Medicine

## 2021-11-09 ENCOUNTER — Encounter: Payer: Self-pay | Admitting: Family Medicine

## 2021-11-09 ENCOUNTER — Encounter: Payer: Self-pay | Admitting: Cardiovascular Disease

## 2021-11-09 DIAGNOSIS — J0140 Acute pansinusitis, unspecified: Secondary | ICD-10-CM

## 2021-11-09 MED ORDER — AMOXICILLIN-POT CLAVULANATE 875-125 MG PO TABS: 1.0000 | ORAL_TABLET | Freq: Two times a day (BID) | ORAL | 0 refills | Status: DC

## 2021-11-09 NOTE — Progress Notes (Signed)
? ? ?MyChart Video Visit ? ? ? ?Virtual Visit via Video Note  ? ?This visit type was conducted due to national recommendations for restrictions regarding the COVID-19 Pandemic (e.g. social distancing) in an effort to limit this patient's exposure and mitigate transmission in our community. This patient is at least at moderate risk for complications without adequate follow up. This format is felt to be most appropriate for this patient at this time. Physical exam was limited by quality of the video and audio technology used for the visit. Dominique Gonzalez. was able to get the patient set up on a video visit. ? ?Patient location: Home Patient and provider in visit ?Provider location: Office ? ?I discussed the limitations of evaluation and management by telemedicine and the availability of in person appointments. The patient expressed understanding and agreed to proceed. ? ?Visit Date: 11/09/2021 ? ?Today's healthcare provider: Ann Held, DO  ? ?Subjective:  ? ? Patient ID: Dominique Gonzalez, female    DOB: Jan 25, 1951, 71 y.o.   MRN: [id number] ? ?Chief Complaint  ?Patient presents with  ? Sinus Problem  ? ? ?Sinus Problem ?Associated symptoms include headaches.  ?Patient is in today for an office visit. ? ?She has been having symptoms for the past 3 weeks. They include sinus pressure and pain behind the eyes, drainage, headache, rhinorrhea and fullness in the ears. Denies fever and chest pain. She coughs when she lies down because of the drainage. She has been using Flonase and Claritin and they provide little relief. ? ?Past Medical History:  ?Diagnosis Date  ? Aortic insufficiency   ? mild  ? B12 deficiency   ? Cardiac murmur   ? Diastolic dysfunction   ? Fatigue   ? Hip pain, right   ? HTN (hypertension)   ? Hyperlipidemia   ? Leg pain, right   ? Osteopenia   ? Shoulder pain, left   ? Sinusitis, acute   ? NOS  ? URI (upper respiratory infection)   ? ? ?Past Surgical History:  ?Procedure Laterality Date  ?  ABDOMINAL HYSTERECTOMY    ? age 13  ? BILATERAL OOPHORECTOMY    ? ? ?Family History  ?Problem Relation Age of Onset  ? Hypertension Father   ? Diabetes Father   ? Hypertension Mother   ? Breast cancer Mother   ? ? ?Social History  ? ?Socioeconomic History  ? Marital status: Married  ?  Spouse name: Not on file  ? Number of children: 2  ? Years of education: Not on file  ? Highest education level: Not on file  ?Occupational History  ? Not on file  ?Tobacco Use  ? Smoking status: Former  ?  Types: Cigarettes  ?  Quit date: 01/10/1996  ?  Years since quitting: 25.8  ? Smokeless tobacco: Never  ? Tobacco comments:  ?  smoked for 10 years,   ?Vaping Use  ? Vaping Use: Never used  ?Substance and Sexual Activity  ? Alcohol use: No  ? Drug use: No  ? Sexual activity: Not on file  ?Other Topics Concern  ? Not on file  ?Social History Narrative  ? Not on file  ? ?Social Determinants of Health  ? ?Financial Resource Strain: Low Risk   ? Difficulty of Paying Living Expenses: Not hard at all  ?Food Insecurity: No Food Insecurity  ? Worried About Charity fundraiser in the Last Year: Never true  ? Ran Out of Food in the Last Year:  Never true  ?Transportation Needs: No Transportation Needs  ? Lack of Transportation (Medical): No  ? Lack of Transportation (Non-Medical): No  ?Physical Activity: Sufficiently Active  ? Days of Exercise per Week: 7 days  ? Minutes of Exercise per Session: 30 min  ?Stress: No Stress Concern Present  ? Feeling of Stress : Not at all  ?Social Connections: Moderately Isolated  ? Frequency of Communication with Friends and Family: More than three times a week  ? Frequency of Social Gatherings with Friends and Family: More than three times a week  ? Attends Religious Services: More than 4 times per year  ? Active Member of Clubs or Organizations: No  ? Attends Archivist Meetings: Never  ? Marital Status: Widowed  ?Intimate Partner Violence: Not At Risk  ? Fear of Current or Ex-Partner: No  ?  Emotionally Abused: No  ? Physically Abused: No  ? Sexually Abused: No  ? ? ?Outpatient Medications Prior to Visit  ?Medication Sig Dispense Refill  ? aspirin 81 MG tablet Take 81 mg by mouth daily.      ? lisinopril-hydrochlorothiazide (ZESTORETIC) 10-12.5 MG tablet Take 1 tablet by mouth daily. 90 tablet 1  ? ?No facility-administered medications prior to visit.  ? ? ?Allergies  ?Allergen Reactions  ? Macrobid [Nitrofurantoin] Nausea And Vomiting  ?  vomiting  ? Bactrim [Sulfamethoxazole-Trimethoprim] Hives  ? ? ?Review of Systems  ?Constitutional:  Negative for fever.  ?HENT:  Positive for sinus pain.   ?     (+) rhinorrhea ?(+) sinus drainage   ?Cardiovascular:  Negative for chest pain.  ?Neurological:  Positive for headaches.  ? ?   ?Objective:  ?  ?Physical Exam ?Constitutional:   ?   Appearance: Normal appearance. She is not ill-appearing.  ?Pulmonary:  ?   Effort: Pulmonary effort is normal.  ?Neurological:  ?   Mental Status: She is alert and oriented to person, place, and time.  ?Psychiatric:     ?   Behavior: Behavior normal.     ?   Thought Content: Thought content normal.     ?   Judgment: Judgment normal.  ? ? ?LMP  (LMP Unknown)  ?Wt Readings from Last 3 Encounters:  ?09/24/21 164 lb 12.8 oz (74.8 kg)  ?09/14/21 165 lb 3.2 oz (74.9 kg)  ?08/28/21 165 lb (74.8 kg)  ? ? ?Diabetic Foot Exam - Simple   ?No data filed ?  ? ?Lab Results  ?Component Value Date  ? WBC 5.9 09/24/2021  ? HGB 14.0 09/24/2021  ? HCT 40.5 09/24/2021  ? PLT 245 09/24/2021  ? GLUCOSE 99 09/24/2021  ? CHOL 167 12/13/2020  ? TRIG 84.0 12/13/2020  ? HDL 48.90 12/13/2020  ? LDLDIRECT 149.3 07/28/2007  ? LDLCALC 102 (H) 12/13/2020  ? ALT 14 12/13/2020  ? AST 19 12/13/2020  ? NA 141 09/24/2021  ? K 3.9 09/24/2021  ? CL 103 09/24/2021  ? CREATININE 0.75 09/24/2021  ? BUN 8 09/24/2021  ? CO2 22 09/24/2021  ? TSH 1.300 09/24/2021  ? HGBA1C 5.2 12/13/2020  ? ? ?Lab Results  ?Component Value Date  ? TSH 1.300 09/24/2021  ? ?Lab Results   ?Component Value Date  ? WBC 5.9 09/24/2021  ? HGB 14.0 09/24/2021  ? HCT 40.5 09/24/2021  ? MCV 88 09/24/2021  ? PLT 245 09/24/2021  ? ?Lab Results  ?Component Value Date  ? NA 141 09/24/2021  ? K 3.9 09/24/2021  ? CO2 22 09/24/2021  ?  GLUCOSE 99 09/24/2021  ? BUN 8 09/24/2021  ? CREATININE 0.75 09/24/2021  ? BILITOT 0.7 12/13/2020  ? ALKPHOS 90 12/13/2020  ? AST 19 12/13/2020  ? ALT 14 12/13/2020  ? PROT 6.6 12/13/2020  ? ALBUMIN 3.9 12/13/2020  ? CALCIUM 9.3 09/24/2021  ? EGFR 86 09/24/2021  ? GFR 78.05 12/13/2020  ? ?Lab Results  ?Component Value Date  ? CHOL 167 12/13/2020  ? ?Lab Results  ?Component Value Date  ? HDL 48.90 12/13/2020  ? ?Lab Results  ?Component Value Date  ? LDLCALC 102 (H) 12/13/2020  ? ?Lab Results  ?Component Value Date  ? TRIG 84.0 12/13/2020  ? ?Lab Results  ?Component Value Date  ? CHOLHDL 3 12/13/2020  ? ?Lab Results  ?Component Value Date  ? HGBA1C 5.2 12/13/2020  ? ? ?   ?Assessment & Plan:  ? ?Problem List Items Addressed This Visit   ? ?  ? Unprioritized  ? SINUSITIS - ACUTE-NOS - Primary  ? Relevant Medications  ? amoxicillin-clavulanate (AUGMENTIN) 875-125 MG tablet  ? ? ?Meds ordered this encounter  ?Medications  ? amoxicillin-clavulanate (AUGMENTIN) 875-125 MG tablet  ?  Sig: Take 1 tablet by mouth 2 (two) times daily.  ?  Dispense:  20 tablet  ?  Refill:  0  ? ? ?I discussed the assessment and treatment plan with the patient. The patient was provided an opportunity to ask questions and all were answered. The patient agreed with the plan and demonstrated an understanding of the instructions. ?  ?The patient was advised to call back or seek an in-person evaluation if the symptoms worsen or if the condition fails to improve as anticipated. ? ? ?I,Zite Okoli,acting as a Education administrator for [employer], DO.,have documented all relevant documentation on the behalf of Ann Held, DO,as directed by  Ann Held, DO while in the presence of Ann Held, DO.   ? ?I, Ann Held, DO, have reviewed all documentation for this visit. The documentation on 11/09/21 for the exam, diagnosis, procedures, and orders are all accurate and complete. ? ? ?I provided 20 minutes of

## 2021-11-13 ENCOUNTER — Ambulatory Visit: Payer: PPO

## 2021-11-13 ENCOUNTER — Encounter: Payer: Self-pay | Admitting: Family Medicine

## 2021-11-22 ENCOUNTER — Encounter: Payer: Self-pay | Admitting: Family Medicine

## 2021-11-23 ENCOUNTER — Ambulatory Visit (INDEPENDENT_AMBULATORY_CARE_PROVIDER_SITE_OTHER): Payer: PPO

## 2021-11-23 ENCOUNTER — Ambulatory Visit: Payer: PPO

## 2021-11-23 DIAGNOSIS — E538 Deficiency of other specified B group vitamins: Secondary | ICD-10-CM | POA: Diagnosis not present

## 2021-11-23 MED ORDER — CYANOCOBALAMIN 1000 MCG/ML IJ SOLN
1000.0000 ug | Freq: Once | INTRAMUSCULAR | Status: AC
  Administered 2021-11-23: 1000 ug via INTRAMUSCULAR

## 2021-11-23 NOTE — Progress Notes (Signed)
Patient here for monthly B-12 per Dr. Etter Sjogren  ? ?Injection given in right deltoid and patient tolerated well ?

## 2021-12-20 ENCOUNTER — Encounter: Payer: Self-pay | Admitting: Family Medicine

## 2021-12-21 ENCOUNTER — Ambulatory Visit: Payer: PPO

## 2021-12-27 ENCOUNTER — Encounter: Payer: Self-pay | Admitting: Family Medicine

## 2021-12-27 ENCOUNTER — Telehealth (INDEPENDENT_AMBULATORY_CARE_PROVIDER_SITE_OTHER): Payer: PPO | Admitting: Family Medicine

## 2021-12-27 DIAGNOSIS — F410 Panic disorder [episodic paroxysmal anxiety] without agoraphobia: Secondary | ICD-10-CM

## 2021-12-27 MED ORDER — LORAZEPAM 0.5 MG PO TABS: 0.5000 mg | ORAL_TABLET | Freq: Three times a day (TID) | ORAL | 1 refills | Status: DC | PRN

## 2021-12-27 MED ORDER — ESCITALOPRAM OXALATE 10 MG PO TABS: 10.0000 mg | ORAL_TABLET | Freq: Every day | ORAL | 2 refills | Status: DC

## 2021-12-27 NOTE — Progress Notes (Signed)
? ? ?MyChart Video Visit ? ? ? ?Virtual Visit via Video Note  ? ?This visit type was conducted due to national recommendations for restrictions regarding the COVID-19 Pandemic (e.g. social distancing) in an effort to limit this patient's exposure and mitigate transmission in our community. This patient is at least at moderate risk for complications without adequate follow up. This format is felt to be most appropriate for this patient at this time. Physical exam was limited by quality of the video and audio technology used for the visit. Alinda Dooms was able to get the patient set up on a video visit. ? ?Patient location: Home Patient and provider in visit ?Provider location: Office ? ?I discussed the limitations of evaluation and management by telemedicine and the availability of in person appointments. The patient expressed understanding and agreed to proceed. ? ?Visit Date: 12/27/2021 ? ?Today's healthcare provider: Ann Held, DO  ? ? ? ?Subjective:  ? ? Patient ID: Dominique Gonzalez, female    DOB: 1950/12/12, 71 y.o.   MRN: [id number] ? ?Chief Complaint  ?Patient presents with  ? Anxiety  ? ? ?Anxiety ?Symptoms include nervous/anxious behavior. Patient reports no chest pain, dizziness, nausea, palpitations or shortness of breath.  ? ? ?Patient is in today for a video visit. ? ?She complains of on and off anxiety for the past 6 months. She has episodes where it worsens. She notes her husband might report she started experiencing anxiety for the past year. She finds when she is experiencing anxiety she can worry about breathing and driving back home as an example. She started taking SAM-e supplements to manage her joint pain and anxiety and found no change in her anxiety. She is not interested in seeing a counselor at this time.  ? ? ?Past Medical History:  ?Diagnosis Date  ? Aortic insufficiency   ? mild  ? B12 deficiency   ? Cardiac murmur   ? Diastolic dysfunction   ? Fatigue   ? Hip pain, right    ? HTN (hypertension)   ? Hyperlipidemia   ? Leg pain, right   ? Osteopenia   ? Shoulder pain, left   ? Sinusitis, acute   ? NOS  ? URI (upper respiratory infection)   ? ? ?Past Surgical History:  ?Procedure Laterality Date  ? ABDOMINAL HYSTERECTOMY    ? age 62  ? BILATERAL OOPHORECTOMY    ? ? ?Family History  ?Problem Relation Age of Onset  ? Hypertension Father   ? Diabetes Father   ? Hypertension Mother   ? Breast cancer Mother   ? ? ?Social History  ? ?Socioeconomic History  ? Marital status: Married  ?  Spouse name: Not on file  ? Number of children: 2  ? Years of education: Not on file  ? Highest education level: Not on file  ?Occupational History  ? Not on file  ?Tobacco Use  ? Smoking status: Former  ?  Types: Cigarettes  ?  Quit date: 01/10/1996  ?  Years since quitting: 25.9  ? Smokeless tobacco: Never  ? Tobacco comments:  ?  smoked for 10 years,   ?Vaping Use  ? Vaping Use: Never used  ?Substance and Sexual Activity  ? Alcohol use: No  ? Drug use: No  ? Sexual activity: Not on file  ?Other Topics Concern  ? Not on file  ?Social History Narrative  ? Not on file  ? ?Social Determinants of Health  ? ?Financial Resource Strain: Low  Risk   ? Difficulty of Paying Living Expenses: Not hard at all  ?Food Insecurity: No Food Insecurity  ? Worried About Charity fundraiser in the Last Year: Never true  ? Ran Out of Food in the Last Year: Never true  ?Transportation Needs: No Transportation Needs  ? Lack of Transportation (Medical): No  ? Lack of Transportation (Non-Medical): No  ?Physical Activity: Sufficiently Active  ? Days of Exercise per Week: 7 days  ? Minutes of Exercise per Session: 30 min  ?Stress: No Stress Concern Present  ? Feeling of Stress : Not at all  ?Social Connections: Moderately Isolated  ? Frequency of Communication with Friends and Family: More than three times a week  ? Frequency of Social Gatherings with Friends and Family: More than three times a week  ? Attends Religious Services: More  than 4 times per year  ? Active Member of Clubs or Organizations: No  ? Attends Archivist Meetings: Never  ? Marital Status: Widowed  ?Intimate Partner Violence: Not At Risk  ? Fear of Current or Ex-Partner: No  ? Emotionally Abused: No  ? Physically Abused: No  ? Sexually Abused: No  ? ? ?Outpatient Medications Prior to Visit  ?Medication Sig Dispense Refill  ? aspirin 81 MG tablet Take 81 mg by mouth daily.      ? lisinopril-hydrochlorothiazide (ZESTORETIC) 10-12.5 MG tablet Take 1 tablet by mouth daily. 90 tablet 1  ? amoxicillin-clavulanate (AUGMENTIN) 875-125 MG tablet Take 1 tablet by mouth 2 (two) times daily. (Patient not taking: Reported on 12/27/2021) 20 tablet 0  ? ?No facility-administered medications prior to visit.  ? ? ?Allergies  ?Allergen Reactions  ? Macrobid [Nitrofurantoin] Nausea And Vomiting  ?  vomiting  ? Bactrim [Sulfamethoxazole-Trimethoprim] Hives  ? ? ?Review of Systems  ?Constitutional:  Negative for fever and malaise/fatigue.  ?HENT:  Negative for congestion.   ?Eyes:  Negative for blurred vision.  ?Respiratory:  Negative for shortness of breath.   ?Cardiovascular:  Negative for chest pain, palpitations and leg swelling.  ?Gastrointestinal:  Negative for abdominal pain, blood in stool and nausea.  ?Genitourinary:  Negative for dysuria and frequency.  ?Musculoskeletal:  Negative for falls.  ?Skin:  Negative for rash.  ?Neurological:  Negative for dizziness, loss of consciousness and headaches.  ?Endo/Heme/Allergies:  Negative for environmental allergies.  ?Psychiatric/Behavioral:  Negative for depression. The patient is nervous/anxious.   ? ?   ?Objective:  ?  ?Physical Exam ?Vitals and nursing note reviewed.  ?Constitutional:   ?   Appearance: She is well-developed.  ?HENT:  ?   Head: Normocephalic and atraumatic.  ?Neck:  ?   Thyroid: No thyromegaly.  ?   Vascular: No carotid bruit or JVD.  ?Pulmonary:  ?   Effort: Pulmonary effort is normal.  ?Neurological:  ?   Mental  Status: She is alert and oriented to person, place, and time.  ?Psychiatric:     ?   Mood and Affect: Mood is anxious. Mood is not depressed. Affect is not tearful or inappropriate.     ?   Behavior: Behavior normal.     ?   Thought Content: Thought content is not paranoid or delusional. Thought content does not include homicidal or suicidal ideation. Thought content does not include homicidal or suicidal plan.     ?   Cognition and Memory: Cognition normal.  ? ? ?LMP  (LMP Unknown)  ?Wt Readings from Last 3 Encounters:  ?09/24/21 164 lb 12.8 oz (  74.8 kg)  ?09/14/21 165 lb 3.2 oz (74.9 kg)  ?08/28/21 165 lb (74.8 kg)  ? ? ?Diabetic Foot Exam - Simple   ?No data filed ?  ? ?Lab Results  ?Component Value Date  ? WBC 5.9 09/24/2021  ? HGB 14.0 09/24/2021  ? HCT 40.5 09/24/2021  ? PLT 245 09/24/2021  ? GLUCOSE 99 09/24/2021  ? CHOL 167 12/13/2020  ? TRIG 84.0 12/13/2020  ? HDL 48.90 12/13/2020  ? LDLDIRECT 149.3 07/28/2007  ? LDLCALC 102 (H) 12/13/2020  ? ALT 14 12/13/2020  ? AST 19 12/13/2020  ? NA 141 09/24/2021  ? K 3.9 09/24/2021  ? CL 103 09/24/2021  ? CREATININE 0.75 09/24/2021  ? BUN 8 09/24/2021  ? CO2 22 09/24/2021  ? TSH 1.300 09/24/2021  ? HGBA1C 5.2 12/13/2020  ? ? ?Lab Results  ?Component Value Date  ? TSH 1.300 09/24/2021  ? ?Lab Results  ?Component Value Date  ? WBC 5.9 09/24/2021  ? HGB 14.0 09/24/2021  ? HCT 40.5 09/24/2021  ? MCV 88 09/24/2021  ? PLT 245 09/24/2021  ? ?Lab Results  ?Component Value Date  ? NA 141 09/24/2021  ? K 3.9 09/24/2021  ? CO2 22 09/24/2021  ? GLUCOSE 99 09/24/2021  ? BUN 8 09/24/2021  ? CREATININE 0.75 09/24/2021  ? BILITOT 0.7 12/13/2020  ? ALKPHOS 90 12/13/2020  ? AST 19 12/13/2020  ? ALT 14 12/13/2020  ? PROT 6.6 12/13/2020  ? ALBUMIN 3.9 12/13/2020  ? CALCIUM 9.3 09/24/2021  ? EGFR 86 09/24/2021  ? GFR 78.05 12/13/2020  ? ?Lab Results  ?Component Value Date  ? CHOL 167 12/13/2020  ? ?Lab Results  ?Component Value Date  ? HDL 48.90 12/13/2020  ? ?Lab Results  ?Component  Value Date  ? LDLCALC 102 (H) 12/13/2020  ? ?Lab Results  ?Component Value Date  ? TRIG 84.0 12/13/2020  ? ?Lab Results  ?Component Value Date  ? CHOLHDL 3 12/13/2020  ? ?Lab Results  ?Component Value Date  ? HGB

## 2022-01-03 ENCOUNTER — Encounter: Payer: Self-pay | Admitting: Family Medicine

## 2022-01-09 ENCOUNTER — Other Ambulatory Visit: Payer: Self-pay | Admitting: Family Medicine

## 2022-01-09 DIAGNOSIS — F410 Panic disorder [episodic paroxysmal anxiety] without agoraphobia: Secondary | ICD-10-CM

## 2022-01-18 DIAGNOSIS — Z1231 Encounter for screening mammogram for malignant neoplasm of breast: Secondary | ICD-10-CM | POA: Diagnosis not present

## 2022-01-18 LAB — HM MAMMOGRAPHY

## 2022-01-24 ENCOUNTER — Encounter: Payer: Self-pay | Admitting: Family Medicine

## 2022-01-24 ENCOUNTER — Other Ambulatory Visit: Payer: Self-pay | Admitting: Family Medicine

## 2022-01-24 DIAGNOSIS — Z8249 Family history of ischemic heart disease and other diseases of the circulatory system: Secondary | ICD-10-CM

## 2022-01-24 NOTE — Telephone Encounter (Signed)
"  My daughter and son has history of blood clots.  Doctor just recently told her it was genetic.  I would like to be tested."   Pt sent 2 Mychart messages. Seems like son and daughter have hx of blood clots and she as told she should be tested with a APC blood test? Please advise

## 2022-01-28 ENCOUNTER — Telehealth (INDEPENDENT_AMBULATORY_CARE_PROVIDER_SITE_OTHER): Payer: PPO | Admitting: Family Medicine

## 2022-01-28 ENCOUNTER — Other Ambulatory Visit: Payer: Self-pay | Admitting: Family Medicine

## 2022-01-28 ENCOUNTER — Encounter: Payer: Self-pay | Admitting: Family Medicine

## 2022-01-28 DIAGNOSIS — F418 Other specified anxiety disorders: Secondary | ICD-10-CM

## 2022-01-28 DIAGNOSIS — Z8249 Family history of ischemic heart disease and other diseases of the circulatory system: Secondary | ICD-10-CM

## 2022-01-28 NOTE — Progress Notes (Signed)
MyChart Video Visit    Virtual Visit via Video Note   This visit type was conducted due to national recommendations for restrictions regarding the COVID-19 Pandemic (e.g. social distancing) in an effort to limit this patient's exposure and mitigate transmission in our community. This patient is at least at moderate risk for complications without adequate follow up. This format is felt to be most appropriate for this patient at this time. Physical exam was limited by quality of the video and audio technology used for the visit. Dominique Gonzalez  was able to get the patient set up on a video visit.  Patient location: Home Patient and provider in visit Provider location: Office  I discussed the limitations of evaluation and management by telemedicine and the availability of in person appointments. The patient expressed understanding and agreed to proceed.  Visit Date: 01/28/2022  Today's healthcare provider: Ann Held, DO     Subjective:    Patient ID: Dominique Gonzalez, female    DOB: 1951/05/16, 71 y.o.   MRN: [id number]  Chief Complaint  Patient presents with   Anxiety   Follow-up    HPI Patient is in today for a virtual office visit.  She has decided not to take the 10 Mg of Lexapro and 0.5 Mg of Ativan after reading up on the medications. She feels that it's best to try to do things her on way. She reports that she is doing okay. She has been getting out and walking more.   Past Medical History:  Diagnosis Date   Aortic insufficiency    mild   B12 deficiency    Cardiac murmur    Diastolic dysfunction    Fatigue    Hip pain, right    HTN (hypertension)    Hyperlipidemia    Leg pain, right    Osteopenia    Shoulder pain, left    Sinusitis, acute    NOS   URI (upper respiratory infection)     Past Surgical History:  Procedure Laterality Date   ABDOMINAL HYSTERECTOMY     age 17   BILATERAL OOPHORECTOMY      Family History  Problem Relation Age of Onset    Hypertension Father    Diabetes Father    Hypertension Mother    Breast cancer Mother     Social History   Socioeconomic History   Marital status: Married    Spouse name: Not on file   Number of children: 2   Years of education: Not on file   Highest education level: Not on file  Occupational History   Not on file  Tobacco Use   Smoking status: Former    Types: Cigarettes    Quit date: 01/10/1996    Years since quitting: 26.0   Smokeless tobacco: Never   Tobacco comments:    smoked for 10 years,   Vaping Use   Vaping Use: Never used  Substance and Sexual Activity   Alcohol use: No   Drug use: No   Sexual activity: Not on file  Other Topics Concern   Not on file  Social History Narrative   Not on file   Social Determinants of Health   Financial Resource Strain: Low Risk  (05/21/2021)   Overall Financial Resource Strain (CARDIA)    Difficulty of Paying Living Expenses: Not hard at all  Food Insecurity: No Food Insecurity (05/21/2021)   Hunger Vital Sign    Worried About Running Out of Food in the Last Year:  Never true    Ran Out of Food in the Last Year: Never true  Transportation Needs: No Transportation Needs (05/21/2021)   PRAPARE - Hydrologist (Medical): No    Lack of Transportation (Non-Medical): No  Physical Activity: Sufficiently Active (05/21/2021)   Exercise Vital Sign    Days of Exercise per Week: 7 days    Minutes of Exercise per Session: 30 min  Stress: No Stress Concern Present (05/21/2021)   [place]    Feeling of Stress : Not at all  Social Connections: Moderately Isolated (05/21/2021)   Social Connection and Isolation Panel [NHANES]    Frequency of Communication with Friends and Family: More than three times a week    Frequency of Social Gatherings with Friends and Family: More than three times a week    Attends Religious Services: More than 4 times per  year    Active Member of [employer] or Organizations: No    Attends Archivist Meetings: Never    Marital Status: Widowed  Intimate Partner Violence: Not At Risk (05/21/2021)   Humiliation, Afraid, Rape, and Kick questionnaire    Fear of Current or Ex-Partner: No    Emotionally Abused: No    Physically Abused: No    Sexually Abused: No    Outpatient Medications Prior to Visit  Medication Sig Dispense Refill   aspirin 81 MG tablet Take 81 mg by mouth daily.       lisinopril-hydrochlorothiazide (ZESTORETIC) 10-12.5 MG tablet Take 1 tablet by mouth daily. 90 tablet 1   escitalopram (LEXAPRO) 10 MG tablet TAKE 1 TABLET BY MOUTH EVERYDAY AT BEDTIME 90 tablet 1   LORazepam (ATIVAN) 0.5 MG tablet Take 1 tablet (0.5 mg total) by mouth every 8 (eight) hours as needed for anxiety. 20 tablet 1   No facility-administered medications prior to visit.    Allergies  Allergen Reactions   Macrobid [Nitrofurantoin] Nausea And Vomiting    vomiting   Bactrim [Sulfamethoxazole-Trimethoprim] Hives    Review of Systems  Constitutional:  Negative for fever and malaise/fatigue.  HENT:  Negative for congestion.   Eyes:  Negative for blurred vision.  Respiratory:  Negative for cough and shortness of breath.   Cardiovascular:  Negative for chest pain, palpitations and leg swelling.  Gastrointestinal:  Negative for vomiting.  Musculoskeletal:  Negative for back pain.  Skin:  Negative for rash.  Neurological:  Negative for loss of consciousness and headaches.       Objective:    Physical Exam Vitals and nursing note reviewed.  Psychiatric:        Mood and Affect: Mood normal.        Behavior: Behavior normal.        Thought Content: Thought content normal.        Judgment: Judgment normal.     LMP  (LMP Unknown)  Wt Readings from Last 3 Encounters:  09/24/21 164 lb 12.8 oz (74.8 kg)  09/14/21 165 lb 3.2 oz (74.9 kg)  08/28/21 165 lb (74.8 kg)    Diabetic Foot Exam - Simple   No  data filed    Lab Results  Component Value Date   WBC 5.9 09/24/2021   HGB 14.0 09/24/2021   HCT 40.5 09/24/2021   PLT 245 09/24/2021   GLUCOSE 99 09/24/2021   CHOL 167 12/13/2020   TRIG 84.0 12/13/2020   HDL 48.90 12/13/2020   LDLDIRECT 149.3 07/28/2007   [place]agner  102 (H) 12/13/2020   ALT 14 12/13/2020   AST 19 12/13/2020   NA 141 09/24/2021   K 3.9 09/24/2021   CL 103 09/24/2021   CREATININE 0.75 09/24/2021   BUN 8 09/24/2021   CO2 22 09/24/2021   TSH 1.300 09/24/2021   HGBA1C 5.2 12/13/2020    Lab Results  Component Value Date   TSH 1.300 09/24/2021   Lab Results  Component Value Date   WBC 5.9 09/24/2021   HGB 14.0 09/24/2021   HCT 40.5 09/24/2021   MCV 88 09/24/2021   PLT 245 09/24/2021   Lab Results  Component Value Date   NA 141 09/24/2021   K 3.9 09/24/2021   CO2 22 09/24/2021   GLUCOSE 99 09/24/2021   BUN 8 09/24/2021   CREATININE 0.75 09/24/2021   BILITOT 0.7 12/13/2020   ALKPHOS 90 12/13/2020   AST 19 12/13/2020   ALT 14 12/13/2020   PROT 6.6 12/13/2020   ALBUMIN 3.9 12/13/2020   CALCIUM 9.3 09/24/2021   EGFR 86 09/24/2021   GFR 78.05 12/13/2020   Lab Results  Component Value Date   CHOL 167 12/13/2020   Lab Results  Component Value Date   HDL 48.90 12/13/2020   Lab Results  Component Value Date   LDLCALC 102 (H) 12/13/2020   Lab Results  Component Value Date   TRIG 84.0 12/13/2020   Lab Results  Component Value Date   CHOLHDL 3 12/13/2020   Lab Results  Component Value Date   HGBA1C 5.2 12/13/2020       Assessment & Plan:   Problem List Items Addressed This Visit   None    No orders of the defined types were placed in this encounter.   I discussed the assessment and treatment plan with the patient. The patient was provided an opportunity to ask questions and all were answered. The patient agreed with the plan and demonstrated an understanding of the instructions.   The patient was advised to call back or seek an  in-person evaluation if the symptoms worsen or if the condition fails to improve as anticipated.  I provided 20 minutes of face-to-face time during this encounter.  I,Amber Collins,acting as a Education administrator for [employer], DO.,have documented all relevant documentation on the behalf of Ann Held, DO,as directed by  Ann Held, DO while in the presence of Ann Held, DO.  Ann Held, DO [place] [facility] [employer] [facility] [phone] (phone) [phone] (fax)  [place]

## 2022-02-03 DIAGNOSIS — F418 Other specified anxiety disorders: Secondary | ICD-10-CM | POA: Insufficient documentation

## 2022-02-03 NOTE — Assessment & Plan Note (Signed)
Resolved without medication  F/u prn

## 2022-02-08 ENCOUNTER — Telehealth: Payer: Self-pay | Admitting: Family Medicine

## 2022-02-08 NOTE — Telephone Encounter (Signed)
-----   Message from Ann Held, DO sent at 01/28/2022  3:16 PM EDT ----- Needs a f/u in 2.5 months for bp / labs

## 2022-02-08 NOTE — Telephone Encounter (Signed)
Called patient and left VM to schedule appt

## 2022-02-20 ENCOUNTER — Encounter: Payer: Self-pay | Admitting: Family Medicine

## 2022-02-22 ENCOUNTER — Ambulatory Visit (INDEPENDENT_AMBULATORY_CARE_PROVIDER_SITE_OTHER): Payer: PPO | Admitting: Family Medicine

## 2022-02-22 ENCOUNTER — Encounter: Payer: Self-pay | Admitting: Family Medicine

## 2022-02-22 VITALS — BP 116/70 | HR 83 | Temp 98.4°F | Resp 18 | Ht 64.0 in | Wt 167.4 lb

## 2022-02-22 DIAGNOSIS — I10 Essential (primary) hypertension: Secondary | ICD-10-CM | POA: Diagnosis not present

## 2022-02-22 DIAGNOSIS — R21 Rash and other nonspecific skin eruption: Secondary | ICD-10-CM | POA: Insufficient documentation

## 2022-02-22 DIAGNOSIS — E538 Deficiency of other specified B group vitamins: Secondary | ICD-10-CM

## 2022-02-22 MED ORDER — CYANOCOBALAMIN 1000 MCG/ML IJ SOLN
1000.0000 ug | INTRAMUSCULAR | Status: AC
  Administered 2022-02-22 – 2023-02-25 (×2): 1000 ug via INTRAMUSCULAR

## 2022-02-22 MED ORDER — CLOTRIMAZOLE-BETAMETHASONE 1-0.05 % EX CREA: 1.0000 | TOPICAL_CREAM | Freq: Every day | CUTANEOUS | 0 refills | Status: DC

## 2022-02-22 MED ORDER — NYSTATIN 100000 UNIT/GM EX POWD: 1.0000 | Freq: Three times a day (TID) | CUTANEOUS | 0 refills | Status: DC

## 2022-02-22 NOTE — Patient Instructions (Signed)
Vitamin B12 Deficiency Vitamin B12 deficiency occurs when the body does not have enough of this important vitamin. The body needs this vitamin: To make red blood cells. To make DNA. This is the genetic material inside cells. To help the nerves work properly so they can carry messages from the brain to the body. Vitamin B12 deficiency can cause health problems, such as not having enough red blood cells in the blood (anemia). This can lead to nerve damage if untreated. What are the causes? This condition may be caused by: Not eating enough foods that contain vitamin B12. Not having enough stomach acid and digestive fluids to properly absorb vitamin B12 from the food that you eat. Having certain diseases that make it hard to absorb vitamin B12. These diseases include Crohn's disease, chronic pancreatitis, and cystic fibrosis. An autoimmune disorder in which the body does not make enough of a protein (intrinsic factor) within the stomach, resulting in not enough absorption of vitamin B12. Having a surgery in which part of the stomach or small intestine is removed. Taking certain medicines that make it hard for the body to absorb vitamin B12. These include: Heartburn medicines, such as antacids and proton pump inhibitors. Some medicines that are used to treat diabetes. What increases the risk? The following factors may make you more likely to develop a vitamin B12 deficiency: Being an older adult. Eating a vegetarian or vegan diet that does not include any foods that come from animals. Eating a poor diet while you are pregnant. Taking certain medicines. Having alcoholism. What are the signs or symptoms? In some cases, there are no symptoms of this condition. If the condition leads to anemia or nerve damage, various symptoms may occur, such as: Weakness. Tiredness (fatigue). Loss of appetite. Numbness or tingling in your hands and feet. Redness and burning of the tongue. Depression,  confusion, or memory problems. Trouble walking. If anemia is severe, symptoms can include: Shortness of breath. Dizziness. Rapid heart rate. How is this diagnosed? This condition may be diagnosed with a blood test to measure the level of vitamin B12 in your blood. You may also have other tests, including: A group of tests that measure certain characteristics of blood cells (complete blood count, CBC). A blood test to measure intrinsic factor. A procedure where a thin tube with a camera on the end is used to look into your stomach or intestines (endoscopy). Other tests may be needed to discover the cause of the deficiency. How is this treated? Treatment for this condition depends on the cause. This condition may be treated by: Changing your eating and drinking habits, such as: Eating more foods that contain vitamin B12. Drinking less alcohol or no alcohol. Getting vitamin B12 injections. Taking vitamin B12 supplements by mouth (orally). Your health care provider will tell you which dose is best for you. Follow these instructions at home: Eating and drinking  Include foods in your diet that come from animals and contain a lot of vitamin B12. These include: Meats and poultry. This includes beef, pork, chicken, turkey, and organ meats, such as liver. Seafood. This includes clams, rainbow trout, salmon, tuna, and haddock. Eggs. Dairy foods such as milk, yogurt, and cheese. Eat foods that have vitamin B12 added to them (are fortified), such as ready-to-eat breakfast cereals. Check the label on the package to see if a food is fortified. The items listed above may not be a complete list of foods and beverages you can eat and drink. Contact a dietitian for   more information. Alcohol use Do not drink alcohol if: Your health care provider tells you not to drink. You are pregnant, may be pregnant, or are planning to become pregnant. If you drink alcohol: Limit how much you have to: 0-1 drink a  day for women. 0-2 drinks a day for men. Know how much alcohol is in your drink. In the [place] one drink equals one 12 oz bottle of beer (355 mL), one 5 oz glass of wine (148 mL), or one 1 oz glass of hard liquor (44 mL). General instructions Get vitamin B12 injections if told to by your health care provider. Take supplements only as told by your health care provider. Follow the directions carefully. Keep all follow-up visits. This is important. Contact a health care provider if: Your symptoms come back. Your symptoms get worse or do not improve with treatment. Get help right away: You develop shortness of breath. You have a rapid heart rate. You have chest pain. You become dizzy or you faint. These symptoms may be an emergency. Get help right away. Call 911. Do not wait to see if the symptoms will go away. Do not drive yourself to the hospital. Summary Vitamin B12 deficiency occurs when the body does not have enough of this important vitamin. Common causes include not eating enough foods that contain vitamin B12, not being able to absorb vitamin B12 from the food that you eat, having a surgery in which part of the stomach or small intestine is removed, or taking certain medicines. Eat foods that have vitamin B12 in them. Treatment may include making a change in the way you eat and drink, getting vitamin B12 injections, or taking vitamin B12 supplements. This information is not intended to replace advice given to you by your health care provider. Make sure you discuss any questions you have with your health care provider. Document Revised: 04/06/2021 Document Reviewed: 04/06/2021 Elsevier Patient Education  2023 Elsevier Inc.  

## 2022-02-22 NOTE — Progress Notes (Signed)
Established Patient Office Visit  Subjective   Patient ID: Dominique Gonzalez, female    DOB: August 24, 1951  Age: 71 y.o. MRN: [id number]  Chief Complaint  Patient presents with   Rash    Pt here to follow up for B12 and states having rash under the left breast.     HPI Pt is here to f/u b12 and she c/o rash under her L breast.  No other complaints  Patient Active Problem List   Diagnosis Date Noted   Rash 02/22/2022   Depression with anxiety 02/03/2022   Urinary frequency 09/14/2021   Costochondritis 08/29/2021   Influenza 07/16/2021   Right knee pain 04/25/2016   Dysuria 04/25/2015   Acute upper respiratory infection 04/25/2015   Acute frontal sinusitis 07/13/2014   UTI (urinary tract infection) 02/02/2014   Multiple thyroid nodules 05/25/2013   Fatigue 08/30/2011   URI (upper respiratory infection) 01/16/2011   VAGINITIS, ATROPHIC 10/09/2010   AORTIC VALVE DISORDERS 01/04/2010   ECHOCARDIOGRAM, ABNORMAL 41/28/7867   DIASTOLIC DYSFUNCTION 67/20/9470   Vitamin B12 deficiency 10/19/2009   FATIGUE 10/09/2009   CARDIAC MURMUR 10/09/2009   SINUSITIS - ACUTE-NOS 11/10/2008   URI 11/10/2008   LEG PAIN, RIGHT 10/14/2008   HIP PAIN, RIGHT 10/06/2008   Hyperlipidemia 02/25/2008   OSTEOPENIA 07/28/2007   Essential hypertension 02/25/2007   SHOULDER PAIN, LEFT 02/25/2007   Past Medical History:  Diagnosis Date   Aortic insufficiency    mild   B12 deficiency    Cardiac murmur    Diastolic dysfunction    Fatigue    Hip pain, right    HTN (hypertension)    Hyperlipidemia    Leg pain, right    Osteopenia    Shoulder pain, left    Sinusitis, acute    NOS   URI (upper respiratory infection)    Past Surgical History:  Procedure Laterality Date   ABDOMINAL HYSTERECTOMY     age 26   BILATERAL OOPHORECTOMY     Social History   Tobacco Use   Smoking status: Former    Types: Cigarettes    Quit date: 01/10/1996    Years since quitting: 26.1   Smokeless tobacco: Never    Tobacco comments:    smoked for 10 years,   Vaping Use   Vaping Use: Never used  Substance Use Topics   Alcohol use: No   Drug use: No   Social History   Socioeconomic History   Marital status: Married    Spouse name: Not on file   Number of children: 2   Years of education: Not on file   Highest education level: Not on file  Occupational History   Not on file  Tobacco Use   Smoking status: Former    Types: Cigarettes    Quit date: 01/10/1996    Years since quitting: 26.1   Smokeless tobacco: Never   Tobacco comments:    smoked for 10 years,   Vaping Use   Vaping Use: Never used  Substance and Sexual Activity   Alcohol use: No   Drug use: No   Sexual activity: Not on file  Other Topics Concern   Not on file  Social History Narrative   Not on file   Social Determinants of Health   Financial Resource Strain: Low Risk  (05/21/2021)   Overall Financial Resource Strain (CARDIA)    Difficulty of Paying Living Expenses: Not hard at all  Food Insecurity: No Food Insecurity (05/21/2021)   Hunger Vital Sign  Worried About Charity fundraiser in the Last Year: Never true    [place] in the Last Year: Never true  Transportation Needs: No Transportation Needs (05/21/2021)   PRAPARE - Hydrologist (Medical): No    Lack of Transportation (Non-Medical): No  Physical Activity: Sufficiently Active (05/21/2021)   Exercise Vital Sign    Days of Exercise per Week: 7 days    Minutes of Exercise per Session: 30 min  Stress: No Stress Concern Present (05/21/2021)   [place]    Feeling of Stress : Not at all  Social Connections: Moderately Isolated (05/21/2021)   Social Connection and Isolation Panel [NHANES]    Frequency of Communication with Friends and Family: More than three times a week    Frequency of Social Gatherings with Friends and Family: More than three times a week     Attends Religious Services: More than 4 times per year    Active Member of [employer] or Organizations: No    Attends Archivist Meetings: Never    Marital Status: Widowed  Intimate Partner Violence: Not At Risk (05/21/2021)   Humiliation, Afraid, Rape, and Kick questionnaire    Fear of Current or Ex-Partner: No    Emotionally Abused: No    Physically Abused: No    Sexually Abused: No   Family Status  Relation Name Status   Father  Deceased   Mother  Deceased   MGM  Deceased   MGF  Deceased   PGM  Deceased   PGF  Deceased   Family History  Problem Relation Age of Onset   Hypertension Father    Diabetes Father    Hypertension Mother    Breast cancer Mother    Allergies  Allergen Reactions   Macrobid [Nitrofurantoin] Nausea And Vomiting    vomiting   Bactrim [Sulfamethoxazole-Trimethoprim] Hives      ROS    Objective:     BP 116/70 (BP Location: Right Arm, Patient Position: Sitting, Cuff Size: Normal)   Pulse 83   Temp 98.4 F (36.9 C) (Oral)   Resp 18   Ht _0  (1.626 m)   Wt 167 lb 6.4 oz (75.9 kg)   LMP  (LMP Unknown)   SpO2 97%   BMI 28.73 kg/m  BP Readings from Last 3 Encounters:  02/22/22 116/70  09/24/21 120/68  09/14/21 128/78   Wt Readings from Last 3 Encounters:  02/22/22 167 lb 6.4 oz (75.9 kg)  09/24/21 164 lb 12.8 oz (74.8 kg)  09/14/21 165 lb 3.2 oz (74.9 kg)   SpO2 Readings from Last 3 Encounters:  02/22/22 97%  09/24/21 99%  09/14/21 97%      Physical Exam Vitals and nursing note reviewed.  Constitutional:      Appearance: She is well-developed.  HENT:     Head: Normocephalic and atraumatic.  Eyes:     Conjunctiva/sclera: Conjunctivae normal.  Neck:     Thyroid: No thyromegaly.     Vascular: No carotid bruit or JVD.  Cardiovascular:     Rate and Rhythm: Normal rate and regular rhythm.     Heart sounds: Normal heart sounds. No murmur heard. Pulmonary:     Effort: Pulmonary effort is normal. No respiratory distress.      Breath sounds: Normal breath sounds. No wheezing or rales.  Chest:     Chest wall: No tenderness.  Musculoskeletal:     Cervical  back: Normal range of motion and neck supple.  Skin:    Findings: Erythema and rash present.          Comments: Mac pap rash under L breast with errythema   Neurological:     Mental Status: She is alert and oriented to person, place, and time.     No results found for any visits on 02/22/22.  Last CBC Lab Results  Component Value Date   WBC 5.9 09/24/2021   HGB 14.0 09/24/2021   HCT 40.5 09/24/2021   MCV 88 09/24/2021   MCH 30.3 09/24/2021   RDW 12.9 09/24/2021   PLT 245 53/66/4403   Last metabolic panel Lab Results  Component Value Date   GLUCOSE 99 09/24/2021   NA 141 09/24/2021   K 3.9 09/24/2021   CL 103 09/24/2021   CO2 22 09/24/2021   BUN 8 09/24/2021   CREATININE 0.75 09/24/2021   EGFR 86 09/24/2021   CALCIUM 9.3 09/24/2021   PROT 6.6 12/13/2020   ALBUMIN 3.9 12/13/2020   BILITOT 0.7 12/13/2020   ALKPHOS 90 12/13/2020   AST 19 12/13/2020   ALT 14 12/13/2020   Last lipids Lab Results  Component Value Date   CHOL 167 12/13/2020   HDL 48.90 12/13/2020   LDLCALC 102 (H) 12/13/2020   LDLDIRECT 149.3 07/28/2007   TRIG 84.0 12/13/2020   CHOLHDL 3 12/13/2020   Last hemoglobin A1c Lab Results  Component Value Date   HGBA1C 5.2 12/13/2020   Last thyroid functions Lab Results  Component Value Date   TSH 1.300 09/24/2021   Last vitamin D Lab Results  Component Value Date   VD25OH 10 (L) 10/09/2010   Last vitamin B12 and Folate Lab Results  Component Value Date   VITAMINB12 462 06/29/2021   FOLATE 21.7 10/09/2010      The 10-year ASCVD risk score (Arnett DK, et al., 2019) is: 9.1%    Assessment & Plan:   Problem List Items Addressed This Visit       Unprioritized   Vitamin B12 deficiency    Check labs  Cont b12       Relevant Medications   cyanocobalamin ((VITAMIN B-12)) injection 1,000 mcg    Rash    lotrisone due to errythema and irritation nystop for later       Relevant Medications   clotrimazole-betamethasone (LOTRISONE) cream   nystatin (MYCOSTATIN/NYSTOP) powder   Other Visit Diagnoses     B12 deficiency    -  Primary   Relevant Medications   cyanocobalamin ((VITAMIN B-12)) injection 1,000 mcg   Other Relevant Orders   CBC with Differential/Platelet   Vitamin B12   Primary hypertension       Relevant Orders   Lipid panel   Comprehensive metabolic panel       Return in about 6 months (around 08/24/2022), or if symptoms worsen or fail to improve, for annual exam, fasting.    Ann Held, DO

## 2022-02-22 NOTE — Assessment & Plan Note (Signed)
Check labs  Cont b12

## 2022-02-22 NOTE — Assessment & Plan Note (Signed)
lotrisone due to errythema and irritation nystop for later

## 2022-02-23 LAB — CBC WITH DIFFERENTIAL/PLATELET
Absolute Monocytes: 538 cells/uL (ref 200–950)
Basophils Absolute: 48 cells/uL (ref 0–200)
Basophils Relative: 0.7 %
Eosinophils Absolute: 186 cells/uL (ref 15–500)
Eosinophils Relative: 2.7 %
HCT: 38.8 % (ref 35.0–45.0)
Hemoglobin: 13.7 g/dL (ref 11.7–15.5)
Lymphs Abs: 2049 cells/uL (ref 850–3900)
MCH: 31.1 pg (ref 27.0–33.0)
MCHC: 35.3 g/dL (ref 32.0–36.0)
MCV: 88 fL (ref 80.0–100.0)
MPV: 11.2 fL (ref 7.5–12.5)
Monocytes Relative: 7.8 %
Neutro Abs: 4078 cells/uL (ref 1500–7800)
Neutrophils Relative %: 59.1 %
Platelets: 239 10*3/uL (ref 140–400)
RBC: 4.41 10*6/uL (ref 3.80–5.10)
RDW: 12.9 % (ref 11.0–15.0)
Total Lymphocyte: 29.7 %
WBC: 6.9 10*3/uL (ref 3.8–10.8)

## 2022-02-23 LAB — COMPREHENSIVE METABOLIC PANEL
AG Ratio: 1.4 (calc) (ref 1.0–2.5)
ALT: 13 U/L (ref 6–29)
AST: 18 U/L (ref 10–35)
Albumin: 3.8 g/dL (ref 3.6–5.1)
Alkaline phosphatase (APISO): 94 U/L (ref 37–153)
BUN: 13 mg/dL (ref 7–25)
CO2: 23 mmol/L (ref 20–32)
Calcium: 8.7 mg/dL (ref 8.6–10.4)
Chloride: 106 mmol/L (ref 98–110)
Creat: 0.85 mg/dL (ref 0.50–1.05)
Globulin: 2.7 g/dL (calc) (ref 1.9–3.7)
Glucose, Bld: 106 mg/dL — ABNORMAL HIGH (ref 65–99)
Potassium: 3.9 mmol/L (ref 3.5–5.3)
Sodium: 140 mmol/L (ref 135–146)
Total Bilirubin: 0.5 mg/dL (ref 0.2–1.2)
Total Protein: 6.5 g/dL (ref 6.1–8.1)

## 2022-02-23 LAB — LIPID PANEL
Cholesterol: 154 mg/dL (ref <200)
HDL: 43 mg/dL — ABNORMAL LOW (ref >=50)
LDL Cholesterol (Calc): 87 mg/dL (calc)
Non-HDL Cholesterol (Calc): 111 mg/dL (calc) (ref <130)
Total CHOL/HDL Ratio: 3.6 (calc) (ref <5.0)
Triglycerides: 143 mg/dL (ref <150)

## 2022-02-23 LAB — VITAMIN B12: Vitamin B-12: 672 pg/mL (ref 200–1100)

## 2022-04-05 ENCOUNTER — Ambulatory Visit: Payer: PPO

## 2022-04-08 ENCOUNTER — Other Ambulatory Visit: Payer: Self-pay | Admitting: Family Medicine

## 2022-04-08 DIAGNOSIS — I10 Essential (primary) hypertension: Secondary | ICD-10-CM

## 2022-04-09 ENCOUNTER — Ambulatory Visit (INDEPENDENT_AMBULATORY_CARE_PROVIDER_SITE_OTHER): Payer: PPO

## 2022-04-09 DIAGNOSIS — E538 Deficiency of other specified B group vitamins: Secondary | ICD-10-CM | POA: Diagnosis not present

## 2022-04-09 MED ORDER — CYANOCOBALAMIN 1000 MCG/ML IJ SOLN
1000.0000 ug | Freq: Once | INTRAMUSCULAR | Status: AC
  Administered 2022-04-09: 1000 ug via INTRAMUSCULAR

## 2022-04-09 NOTE — Progress Notes (Signed)
Dominique Gonzalez is a 71 y.o. female presents to the office today for B12:  injections, per physician's orders. Original order: on office visit 02-22-22 to continue B12. Cyanocobalamin  (med), 1000 mg/ml (dose),  im (route) was administered Left deltoid (location) today. Patient tolerated injection. Patient due for follow up labs/provider appt. In December 2023.  Patient next injection due: one month for B12, appt made Yes for 05/10/22.   Jiles Prows

## 2022-05-10 ENCOUNTER — Ambulatory Visit: Payer: PPO

## 2022-05-10 ENCOUNTER — Encounter: Payer: Self-pay | Admitting: Family Medicine

## 2022-05-10 NOTE — Progress Notes (Deleted)
Dominique Gonzalez is a 71 y.o. female presents to the office today for Monthly B12:  injections, per physician's orders. Original order:  02-22-22 to continue B12. Cyanocobalamin  (med), 1000 mg/ml (dose),  im (route) was administered *** deltoid (location) today. Patient tolerated injection. Patient due for follow up labs/provider appt. In December 2023.  Patient next injection due: one month for B12, appt made ***    Creft, Tierra L

## 2022-05-25 ENCOUNTER — Telehealth: Payer: PPO | Admitting: Nurse Practitioner

## 2022-05-25 DIAGNOSIS — B379 Candidiasis, unspecified: Secondary | ICD-10-CM

## 2022-05-25 DIAGNOSIS — B9689 Other specified bacterial agents as the cause of diseases classified elsewhere: Secondary | ICD-10-CM

## 2022-05-25 DIAGNOSIS — J019 Acute sinusitis, unspecified: Secondary | ICD-10-CM

## 2022-05-25 DIAGNOSIS — T3695XA Adverse effect of unspecified systemic antibiotic, initial encounter: Secondary | ICD-10-CM | POA: Diagnosis not present

## 2022-05-25 MED ORDER — AMOXICILLIN-POT CLAVULANATE 875-125 MG PO TABS: 1.0000 | ORAL_TABLET | Freq: Two times a day (BID) | ORAL | 0 refills | Status: AC

## 2022-05-25 MED ORDER — FLUCONAZOLE 150 MG PO TABS: 150.0000 mg | ORAL_TABLET | Freq: Once | ORAL | 0 refills | Status: AC

## 2022-05-25 NOTE — Patient Instructions (Signed)
  Dominique Gonzalez, thank you for joining Gildardo Pounds, NP for today's virtual visit.  While this provider is not your primary care provider (PCP), if your PCP is located in our provider database this encounter information will be shared with them immediately following your visit.  Consent: (Patient) Dominique Gonzalez provided verbal consent for this virtual visit at the beginning of the encounter.  Current Medications:  Current Outpatient Medications:    amoxicillin-clavulanate (AUGMENTIN) 875-125 MG tablet, Take 1 tablet by mouth 2 (two) times daily for 7 days., Disp: 14 tablet, Rfl: 0   fluconazole (DIFLUCAN) 150 MG tablet, Take 1 tablet (150 mg total) by mouth once for 1 dose. May take an additional dose in 3 days if needed, Disp: 2 tablet, Rfl: 0   aspirin 81 MG tablet, Take 81 mg by mouth daily.  , Disp: , Rfl:    clotrimazole-betamethasone (LOTRISONE) cream, Apply 1 Application topically daily., Disp: 30 g, Rfl: 0   lisinopril-hydrochlorothiazide (ZESTORETIC) 10-12.5 MG tablet, TAKE 1 TABLET BY MOUTH EVERY DAY, Disp: 90 tablet, Rfl: 1   nystatin (MYCOSTATIN/NYSTOP) powder, Apply 1 Application topically 3 (three) times daily., Disp: 15 g, Rfl: 0  Current Facility-Administered Medications:    cyanocobalamin ((VITAMIN B-12)) injection 1,000 mcg, 1,000 mcg, Intramuscular, Q30 days, Carollee Herter, Yvonne R, DO, 1,000 mcg at 02/22/22 1535   Medications ordered in this encounter:  Meds ordered this encounter  Medications   amoxicillin-clavulanate (AUGMENTIN) 875-125 MG tablet    Sig: Take 1 tablet by mouth 2 (two) times daily for 7 days.    Dispense:  14 tablet    Refill:  0    Order Specific Question:   Supervising Provider    Answer:   MILLER, BRIAN [[id number]]   fluconazole (DIFLUCAN) 150 MG tablet    Sig: Take 1 tablet (150 mg total) by mouth once for 1 dose. May take an additional dose in 3 days if needed    Dispense:  2 tablet    Refill:  0    Order Specific Question:   Supervising  Provider    Answer:   Sabra Heck, [place]     *If you need refills on other medications prior to your next appointment, please contact your pharmacy*  Follow-Up: Call back or seek an in-person evaluation if the symptoms worsen or if the condition fails to improve as anticipated.  [facility] [phone]  Other Instructions May take motrin or tylenol for pain and or fever   If you have been instructed to have an in-person evaluation today at a local Urgent Care facility, please use the link below. It will take you to a list of all of our available [facility], including address, phone number and hours of operation. Please do not delay care.  [facility]  If you or a family member do not have a primary care provider, use the link below to schedule a visit and establish care. When you choose a [facility] primary care physician or advanced practice provider, you gain a long-term partner in health. Find a Primary Care Provider  Learn more about [facility] in-office and virtual care options: Portal Now

## 2022-05-25 NOTE — Progress Notes (Signed)
Virtual Visit Consent   Dominique Gonzalez, you are scheduled for a virtual visit with a [facility] provider today. Just as with appointments in the office, your consent must be obtained to participate. Your consent will be active for this visit and any virtual visit you may have with one of our providers in the next 365 days. If you have a MyChart account, a copy of this consent can be sent to you electronically.  As this is a virtual visit, video technology does not allow for your provider to perform a traditional examination. This may limit your provider's ability to fully assess your condition. If your provider identifies any concerns that need to be evaluated in person or the need to arrange testing (such as labs, EKG, etc.), we will make arrangements to do so. Although advances in technology are sophisticated, we cannot ensure that it will always work on either your end or our end. If the connection with a video visit is poor, the visit may have to be switched to a telephone visit. With either a video or telephone visit, we are not always able to ensure that we have a secure connection.  By engaging in this virtual visit, you consent to the provision of healthcare and authorize for your insurance to be billed (if applicable) for the services provided during this visit. Depending on your insurance coverage, you may receive a charge related to this service.  I need to obtain your verbal consent now. Are you willing to proceed with your visit today? Dominique Gonzalez has provided verbal consent on 05/25/2022 for a virtual visit (video or telephone). Dominique Pounds, NP  Date: 05/25/2022 8:37 AM  Virtual Visit via Video Note   I, Dominique Gonzalez, connected with  Dominique Gonzalez  ([id number], March 10, 1951) on 05/25/22 at  8:30 AM EDT by a video-enabled telemedicine application and verified that I am speaking with the correct person using two identifiers.  Location: Patient: Virtual Visit Location Patient:  Home Provider: Virtual Visit Location Provider: Home Office   I discussed the limitations of evaluation and management by telemedicine and the availability of in person appointments. The patient expressed understanding and agreed to proceed.    History of Present Illness: Dominique Gonzalez is a 71 y.o. who identifies as a female who was assigned female at birth, and is being seen today for sinus infection.  Upper respiratory symptoms She complains of bilateral ear pressure/pain, congestion, facial pain, nasal congestion, nausea without vomiting, post nasal drip, purulent nasal discharge, sinus pressure, and sore throat.with no fever, chills, night sweats or weight loss. Onset of symptoms was a few weeks ago and  unchanged as of today . Patient is former smoker, quit over 20 years ago  ---------------------------------------------------------------------------------------------------  Problems:  Patient Active Problem List   Diagnosis Date Noted   Rash 02/22/2022   Depression with anxiety 02/03/2022   Urinary frequency 09/14/2021   Costochondritis 08/29/2021   Influenza 07/16/2021   Right knee pain 04/25/2016   Dysuria 04/25/2015   Acute upper respiratory infection 04/25/2015   Acute frontal sinusitis 07/13/2014   UTI (urinary tract infection) 02/02/2014   Multiple thyroid nodules 05/25/2013   Fatigue 08/30/2011   URI (upper respiratory infection) 01/16/2011   VAGINITIS, ATROPHIC 10/09/2010   AORTIC VALVE DISORDERS 01/04/2010   ECHOCARDIOGRAM, ABNORMAL 22/29/7989   DIASTOLIC DYSFUNCTION 21/19/4174   Vitamin B12 deficiency 10/19/2009   FATIGUE 10/09/2009   CARDIAC MURMUR 10/09/2009   SINUSITIS - ACUTE-NOS 11/10/2008   URI 11/10/2008   LEG  PAIN, RIGHT 10/14/2008   HIP PAIN, RIGHT 10/06/2008   Hyperlipidemia 02/25/2008   OSTEOPENIA 07/28/2007   Essential hypertension 02/25/2007   SHOULDER PAIN, LEFT 02/25/2007    Allergies:  Allergies  Allergen Reactions   Macrobid  [Nitrofurantoin] Nausea And Vomiting    vomiting   Bactrim [Sulfamethoxazole-Trimethoprim] Hives   Medications:  Current Outpatient Medications:    amoxicillin-clavulanate (AUGMENTIN) 875-125 MG tablet, Take 1 tablet by mouth 2 (two) times daily for 7 days., Disp: 14 tablet, Rfl: 0   fluconazole (DIFLUCAN) 150 MG tablet, Take 1 tablet (150 mg total) by mouth once for 1 dose. May take an additional dose in 3 days if needed, Disp: 2 tablet, Rfl: 0   aspirin 81 MG tablet, Take 81 mg by mouth daily.  , Disp: , Rfl:    clotrimazole-betamethasone (LOTRISONE) cream, Apply 1 Application topically daily., Disp: 30 g, Rfl: 0   lisinopril-hydrochlorothiazide (ZESTORETIC) 10-12.5 MG tablet, TAKE 1 TABLET BY MOUTH EVERY DAY, Disp: 90 tablet, Rfl: 1   nystatin (MYCOSTATIN/NYSTOP) powder, Apply 1 Application topically 3 (three) times daily., Disp: 15 g, Rfl: 0  Current Facility-Administered Medications:    cyanocobalamin ((VITAMIN B-12)) injection 1,000 mcg, 1,000 mcg, Intramuscular, Q30 days, Carollee Herter, [place] R, DO, 1,000 mcg at 02/22/22 1535  Observations/Objective: Patient is well-developed, well-nourished in no acute distress.  Resting comfortably at home.  Head is normocephalic, atraumatic.  No labored breathing.  Speech is clear and coherent with logical content.  Patient is alert and oriented at baseline.    Assessment and Plan: 1. Acute bacterial sinusitis - amoxicillin-clavulanate (AUGMENTIN) 875-125 MG tablet; Take 1 tablet by mouth 2 (two) times daily for 7 days.  Dispense: 14 tablet; Refill: 0  2. Antibiotic-induced yeast infection - fluconazole (DIFLUCAN) 150 MG tablet; Take 1 tablet (150 mg total) by mouth once for 1 dose. May take an additional dose in 3 days if needed  Dispense: 2 tablet; Refill: 0    Follow Up Instructions: I discussed the assessment and treatment plan with the patient. The patient was provided an opportunity to ask questions and all were answered. The patient  agreed with the plan and demonstrated an understanding of the instructions.  A copy of instructions were sent to the patient via MyChart unless otherwise noted below.   Patient has requested to receive PHI (AVS, Work Notes, etc) pertaining to this video visit through e-mail as they are currently without active [facility]. They have voiced understand that email is not considered secure and their health information could be viewed by someone other than the patient.   The patient was advised to call back or seek an in-person evaluation if the symptoms worsen or if the condition fails to improve as anticipated.  Time:  I spent 12 minutes with the patient via telehealth technology discussing the above problems/concerns.    Dominique Pounds, NP

## 2022-06-14 ENCOUNTER — Encounter: Payer: Self-pay | Admitting: Family Medicine

## 2022-07-04 DIAGNOSIS — M25561 Pain in right knee: Secondary | ICD-10-CM | POA: Diagnosis not present

## 2022-07-04 DIAGNOSIS — M1711 Unilateral primary osteoarthritis, right knee: Secondary | ICD-10-CM | POA: Diagnosis not present

## 2022-07-24 ENCOUNTER — Encounter: Payer: Self-pay | Admitting: Family Medicine

## 2022-08-02 DIAGNOSIS — M1711 Unilateral primary osteoarthritis, right knee: Secondary | ICD-10-CM | POA: Diagnosis not present

## 2022-09-22 NOTE — Progress Notes (Unsigned)
No chief complaint on file.  History of Present Illness: 72 yo female with history of aortic valve insufficiency, mitral regurgitation and HTN here today for follow up. She has been followed in my office since 2011 when she was found to have mild aortic valve insufficiency. Echo in 2011 showed normal LV systolic function, diastolic dysfunction, mild AI, small effusion and possible epicardial mass/aortic root mass. I ordered a cardiac MRI which showed prominent epicardial fat pad with no evidence of aortic or pericardial tumor or mass. Most recent echo February 2023 with LVEF=55-60%, trivial MR, mild AI.   She is here today for follow up. The patient denies any chest pain, dyspnea, palpitations, lower extremity edema, orthopnea, PND, dizziness, near syncope or syncope.    Primary Care Physician:  Carollee Herter, Alferd Apa, DO  Past Medical History:  Diagnosis Date   Aortic insufficiency    mild   B12 deficiency    Cardiac murmur    Diastolic dysfunction    Fatigue    Hip pain, right    HTN (hypertension)    Hyperlipidemia    Leg pain, right    Osteopenia    Shoulder pain, left    Sinusitis, acute    NOS   URI (upper respiratory infection)     Past Surgical History:  Procedure Laterality Date   ABDOMINAL HYSTERECTOMY     age 33   BILATERAL OOPHORECTOMY      Current Outpatient Medications  Medication Sig Dispense Refill   aspirin 81 MG tablet Take 81 mg by mouth daily.       clotrimazole-betamethasone (LOTRISONE) cream Apply 1 Application topically daily. 30 g 0   lisinopril-hydrochlorothiazide (ZESTORETIC) 10-12.5 MG tablet TAKE 1 TABLET BY MOUTH EVERY DAY 90 tablet 1   nystatin (MYCOSTATIN/NYSTOP) powder Apply 1 Application topically 3 (three) times daily. 15 g 0   Current Facility-Administered Medications  Medication Dose Route Frequency Provider Last Rate Last Admin   cyanocobalamin ((VITAMIN B-12)) injection 1,000 mcg  1,000 mcg Intramuscular Q30 days Carollee Herter, Yvonne  R, DO   1,000 mcg at 02/22/22 1535    Allergies  Allergen Reactions   Macrobid [Nitrofurantoin] Nausea And Vomiting    vomiting   Bactrim [Sulfamethoxazole-Trimethoprim] Hives    Social History   Socioeconomic History   Marital status: Married    Spouse name: Not on file   Number of children: 2   Years of education: Not on file   Highest education level: Not on file  Occupational History   Not on file  Tobacco Use   Smoking status: Former    Types: Cigarettes    Quit date: 01/10/1996    Years since quitting: 26.7   Smokeless tobacco: Never   Tobacco comments:    smoked for 10 years,   Vaping Use   Vaping Use: Never used  Substance and Sexual Activity   Alcohol use: No   Drug use: No   Sexual activity: Not on file  Other Topics Concern   Not on file  Social History Narrative   Not on file   Social Determinants of Health   Financial Resource Strain: Low Risk  (05/21/2021)   Overall Financial Resource Strain (CARDIA)    Difficulty of Paying Living Expenses: Not hard at all  Food Insecurity: No Food Insecurity (05/21/2021)   Hunger Vital Sign    Worried About Running Out of Food in the Last Year: Never true    [place] in the Last Year: Never  true  Transportation Needs: No Transportation Needs (05/21/2021)   PRAPARE - Hydrologist (Medical): No    Lack of Transportation (Non-Medical): No  Physical Activity: Sufficiently Active (05/21/2021)   Exercise Vital Sign    Days of Exercise per Week: 7 days    Minutes of Exercise per Session: 30 min  Stress: No Stress Concern Present (05/21/2021)   Freedom    Feeling of Stress : Not at all  Social Connections: Moderately Isolated (05/21/2021)   Social Connection and Isolation Panel [NHANES]    Frequency of Communication with Friends and Family: More than three times a week    Frequency of Social Gatherings with Friends and  Family: More than three times a week    Attends Religious Services: More than 4 times per year    Active Member of [employer] or Organizations: No    Attends Archivist Meetings: Never    Marital Status: Widowed  Intimate Partner Violence: Not At Risk (05/21/2021)   Humiliation, Afraid, Rape, and Kick questionnaire    Fear of Current or Ex-Partner: No    Emotionally Abused: No    Physically Abused: No    Sexually Abused: No    Family History  Problem Relation Age of Onset   Hypertension Father    Diabetes Father    Hypertension Mother    Breast cancer Mother     Review of Systems:  As stated in the HPI and otherwise negative.   LMP  (LMP Unknown)   Physical Examination: General: Well developed, well nourished, NAD  HEENT: OP clear, mucus membranes moist  SKIN: warm, dry. No rashes. Neuro: No focal deficits  Musculoskeletal: Muscle strength 5/5 all ext  Psychiatric: Mood and affect normal  Neck: No JVD, no carotid bruits, no thyromegaly, no lymphadenopathy.  Lungs:Clear bilaterally, no wheezes, rhonci, crackles Cardiovascular: Regular rate and rhythm. No murmurs, gallops or rubs. Abdomen:Soft. Bowel sounds present. Non-tender.  Extremities: No lower extremity edema. Pulses are 2 + in the bilateral DP/PT.  Echo February 2023: 1. Left ventricular ejection fraction, by estimation, is 55 to 60%. The  left ventricle has normal function. The left ventricle has no regional  wall motion abnormalities. Left ventricular diastolic parameters were  normal.   2. Right ventricular systolic function is normal. The right ventricular  size is normal.   3. The mitral valve is normal in structure. Trivial mitral valve  regurgitation. No evidence of mitral stenosis.   4. The aortic valve is tricuspid. Aortic valve regurgitation is mild. No  aortic stenosis is present.   5. The inferior vena cava is normal in size with greater than 50%  respiratory variability, suggesting right atrial  pressure of 3 mmHg.   EKG:  EKG is *** ordered today. The ekg ordered today demonstrates   Recent Labs: 09/24/2021: TSH 1.300 02/22/2022: ALT 13; BUN 13; Creat 0.85; Hemoglobin 13.7; Platelets 239; Potassium 3.9; Sodium 140   Lipid Panel    Component Value Date/Time   CHOL 154 02/22/2022 1527   TRIG 143 02/22/2022 1527   HDL 43 (L) 02/22/2022 1527   CHOLHDL 3.6 02/22/2022 1527   VLDL 16.8 12/13/2020 0841   LDLCALC 87 02/22/2022 1527   LDLDIRECT 149.3 07/28/2007 0000     Wt Readings from Last 3 Encounters:  02/22/22 75.9 kg  09/24/21 74.8 kg  09/14/21 74.9 kg     Assessment and Plan:   1. AORTIC VALVE INSUFFICIENCY:  Mild by echo in February 2023. Repeat echo in February 2026.   2. Mitral valve regurgitation: No significant MR by echo February 2023  3. HTN: BP well controlled. No changes today  Labs/ tests ordered today include:  No orders of the defined types were placed in this encounter.   Disposition:   F/U with me in 12  months  Signed, Lauree Chandler, MD 09/22/2022 10:15 AM    [facility] [place], [place], [place]  [address] Phone: [phone]; Fax: [phone]

## 2022-09-23 ENCOUNTER — Encounter: Payer: Self-pay | Admitting: Cardiovascular Disease

## 2022-09-23 ENCOUNTER — Ambulatory Visit: Payer: PPO | Attending: Cardiovascular Disease | Admitting: Cardiovascular Disease

## 2022-09-23 VITALS — BP 116/78 | HR 76 | Ht 64.0 in | Wt 173.0 lb

## 2022-09-23 DIAGNOSIS — I10 Essential (primary) hypertension: Secondary | ICD-10-CM | POA: Diagnosis not present

## 2022-09-23 DIAGNOSIS — I351 Nonrheumatic aortic (valve) insufficiency: Secondary | ICD-10-CM

## 2022-09-23 MED ORDER — LISINOPRIL-HYDROCHLOROTHIAZIDE 10-12.5 MG PO TABS: 1.0000 | ORAL_TABLET | Freq: Every day | ORAL | 3 refills | Status: DC

## 2022-09-23 NOTE — Patient Instructions (Signed)
Medication Instructions:  No changes *If you need a refill on your cardiac medications before your next appointment, please call your pharmacy*   Lab Work: none If you have labs (blood work) drawn today and your tests are completely normal, you will receive your results only by: MyChart Message (if you have MyChart) OR A paper copy in the mail If you have any lab test that is abnormal or we need to change your treatment, we will call you to review the results.   Testing/Procedures: none   Follow-Up: At [facility], you and your health needs are our priority.  As part of our continuing mission to provide you with exceptional heart care, we have created designated Provider Care Teams.  These Care Teams include your primary Cardiologist (physician) and Advanced Practice Providers (APPs -  Physician Assistants and Nurse Practitioners) who all work together to provide you with the care you need, when you need it.   Your next appointment:   12 month(s)  Provider:   Christopher McAlhany, MD      

## 2022-11-12 ENCOUNTER — Encounter: Payer: Self-pay | Admitting: Family Medicine

## 2022-12-05 ENCOUNTER — Encounter: Payer: Self-pay | Admitting: Family Medicine

## 2022-12-05 ENCOUNTER — Telehealth (INDEPENDENT_AMBULATORY_CARE_PROVIDER_SITE_OTHER): Payer: PPO | Admitting: Family Medicine

## 2022-12-05 DIAGNOSIS — F4321 Adjustment disorder with depressed mood: Secondary | ICD-10-CM | POA: Diagnosis not present

## 2022-12-05 MED ORDER — TRAZODONE HCL 50 MG PO TABS: 25.0000 mg | ORAL_TABLET | Freq: Every evening | ORAL | 3 refills | Status: DC | PRN

## 2022-12-05 NOTE — Progress Notes (Addendum)
MyChart Video Visit    Virtual Visit via Video Note   This visit type was conducted due to national recommendations for restrictions regarding the COVID-19 Pandemic (e.g. social distancing) in an effort to limit this patient's exposure and mitigate transmission in our community. This patient is at least at moderate risk for complications without adequate follow up. This format is felt to be most appropriate for this patient at this time. Physical exam was limited by quality of the video and audio technology used for the visit. Dominique SetaHeather was able to get the patient set up on a video visit.  Patient location: Home Patient and provider in visit Provider location: Office  I discussed the limitations of evaluation and management by telemedicine and the availability of in person appointments. The patient expressed understanding and agreed to proceed.  Visit Date: 12/05/2022  Today's healthcare provider: Donato SchultzYvonne R Lowne Chase, DO     Subjective:    Patient ID: Dominique AspenVickie Gonzalez, female    DOB: [birth year], 72 y.o.   MRN: [id number]  Chief Complaint  Patient presents with   Grief    HPI Patient is in today for a video visit.   Her husband passed away recently. She has been suffering from insomnia and depression since then. Her depression is worse at night. Her great granddaughter is currently staying with her. Her family is currently supporting her at this time. She is interested in seeing a counselor virtually.    Past Medical History:  Diagnosis Date   Aortic insufficiency    mild   B12 deficiency    Cardiac murmur    Diastolic dysfunction    Fatigue    Hip pain, right    HTN (hypertension)    Hyperlipidemia    Leg pain, right    Osteopenia    Shoulder pain, left    Sinusitis, acute    NOS   URI (upper respiratory infection)     Past Surgical History:  Procedure Laterality Date   ABDOMINAL HYSTERECTOMY     age 72   BILATERAL OOPHORECTOMY      Family History   Problem Relation Age of Onset   Hypertension Father    Diabetes Father    Hypertension Mother    Breast cancer Mother     Social History   Socioeconomic History   Marital status: Married    Spouse name: Not on file   Number of children: 2   Years of education: Not on file   Highest education level: Not on file  Occupational History   Not on file  Tobacco Use   Smoking status: Former    Types: Cigarettes    Quit date: 01/10/1996    Years since quitting: 26.9   Smokeless tobacco: Never   Tobacco comments:    smoked for 10 years,   Vaping Use   Vaping Use: Never used  Substance and Sexual Activity   Alcohol use: No   Drug use: No   Sexual activity: Not on file  Other Topics Concern   Not on file  Social History Narrative   Not on file   Social Determinants of Health   Financial Resource Strain: Low Risk  (05/21/2021)   Overall Financial Resource Strain (CARDIA)    Difficulty of Paying Living Expenses: Not hard at all  Food Insecurity: No Food Insecurity (05/21/2021)   Hunger Vital Sign    Worried About Running Out of Food in the Last Year: Never true    Ran Out  of Food in the Last Year: Never true  Transportation Needs: No Transportation Needs (05/21/2021)   PRAPARE - Administrator, Civil Service (Medical): No    Lack of Transportation (Non-Medical): No  Physical Activity: Sufficiently Active (05/21/2021)   Exercise Vital Sign    Days of Exercise per Week: 7 days    Minutes of Exercise per Session: 30 min  Stress: No Stress Concern Present (05/21/2021)   [employer] of Occupational Health - Occupational Stress Questionnaire    Feeling of Stress : Not at all  Social Connections: Moderately Isolated (05/21/2021)   Social Connection and Isolation Panel [NHANES]    Frequency of Communication with Friends and Family: More than three times a week    Frequency of Social Gatherings with Friends and Family: More than three times a week    Attends Religious  Services: More than 4 times per year    Active Member of [employer] or Organizations: No    Attends Banker Meetings: Never    Marital Status: Widowed  Intimate Partner Violence: Not At Risk (05/21/2021)   Humiliation, Afraid, Rape, and Kick questionnaire    Fear of Current or Ex-Partner: No    Emotionally Abused: No    Physically Abused: No    Sexually Abused: No    Outpatient Medications Prior to Visit  Medication Sig Dispense Refill   aspirin 81 MG tablet Take 81 mg by mouth daily.       lisinopril-hydrochlorothiazide (ZESTORETIC) 10-12.5 MG tablet Take 1 tablet by mouth daily. 90 tablet 3   Facility-Administered Medications Prior to Visit  Medication Dose Route Frequency Provider Last Rate Last Admin   cyanocobalamin ((VITAMIN B-12)) injection 1,000 mcg  1,000 mcg Intramuscular Q30 days Zola Button, Christia Coaxum R, DO   1,000 mcg at 02/22/22 1535    Allergies  Allergen Reactions   Macrobid [Nitrofurantoin] Nausea And Vomiting    vomiting   Bactrim [Sulfamethoxazole-Trimethoprim] Hives    Review of Systems  Constitutional:  Negative for fever and malaise/fatigue.  HENT:  Negative for congestion.   Eyes:  Negative for blurred vision.  Respiratory:  Negative for shortness of breath.   Cardiovascular:  Negative for chest pain, palpitations and leg swelling.  Gastrointestinal:  Negative for abdominal pain, blood in stool and nausea.  Genitourinary:  Negative for dysuria and frequency.  Musculoskeletal:  Negative for falls.  Skin:  Negative for rash.  Neurological:  Negative for dizziness, loss of consciousness and headaches.  Endo/Heme/Allergies:  Negative for environmental allergies.  Psychiatric/Behavioral:  Positive for depression. Negative for suicidal ideas. The patient has insomnia. The patient is not nervous/anxious.        Objective:    Physical Exam Vitals and nursing note reviewed.  Constitutional:      Appearance: She is well-developed.  Neck:      Thyroid: No thyromegaly.     Vascular: No JVD.  Neurological:     Mental Status: She is alert and oriented to person, place, and time.  Psychiatric:        Mood and Affect: Mood is anxious and depressed. Affect is tearful.        Speech: Speech normal.        Behavior: Behavior normal.    LMP  (LMP Unknown)  Wt Readings from Last 3 Encounters:  09/23/22 173 lb (78.5 kg)  02/22/22 167 lb 6.4 oz (75.9 kg)  09/24/21 164 lb 12.8 oz (74.8 kg)       Assessment & Plan:  Grief Assessment & Plan: Referral placed for counsling Trazadone for sleep F/u 1 month or sooner as needed   Orders: -     traZODone HCl; Take 0.5-1 tablets (25-50 mg total) by mouth at bedtime as needed for sleep.  Dispense: 30 tablet; Refill: 3 -     Ambulatory referral to Psychology     I discussed the assessment and treatment plan with the patient. The patient was provided an opportunity to ask questions and all were answered. The patient agreed with the plan and demonstrated an understanding of the instructions.   The patient was advised to call back or seek an in-person evaluation if the symptoms worsen or if the condition fails to improve as anticipated.  Donato Schultz, DO [facility] [phone] (phone) [phone] (fax)  [facility]    I,Shehryar Baig,acting as a scribe for Donato Schultz, DO.,have documented all relevant documentation on the behalf of Donato Schultz, DO,as directed by  Donato Schultz, DO while in the presence of Donato Schultz, DO.

## 2022-12-05 NOTE — Assessment & Plan Note (Signed)
Referral placed for counsling Trazadone for sleep F/u 1 month or sooner as needed

## 2022-12-29 ENCOUNTER — Other Ambulatory Visit: Payer: Self-pay | Admitting: Family Medicine

## 2022-12-29 DIAGNOSIS — F4321 Adjustment disorder with depressed mood: Secondary | ICD-10-CM

## 2023-01-03 ENCOUNTER — Telehealth: Payer: PPO | Admitting: Physician Assistant

## 2023-01-03 DIAGNOSIS — J019 Acute sinusitis, unspecified: Secondary | ICD-10-CM | POA: Diagnosis not present

## 2023-01-03 DIAGNOSIS — B9689 Other specified bacterial agents as the cause of diseases classified elsewhere: Secondary | ICD-10-CM

## 2023-01-03 MED ORDER — AMOXICILLIN-POT CLAVULANATE 875-125 MG PO TABS
1.0000 | ORAL_TABLET | Freq: Two times a day (BID) | ORAL | 0 refills | Status: DC
Start: 2023-01-03 — End: 2023-03-28

## 2023-01-03 NOTE — Progress Notes (Signed)
Virtual Visit Consent   Dominique Gonzalez, you are scheduled for a virtual visit with a [facility] provider today. Just as with appointments in the office, your consent must be obtained to participate. Your consent will be active for this visit and any virtual visit you may have with one of our providers in the next 365 days. If you have a MyChart account, a copy of this consent can be sent to you electronically.  As this is a virtual visit, video technology does not allow for your provider to perform a traditional examination. This may limit your provider's ability to fully assess your condition. If your provider identifies any concerns that need to be evaluated in person or the need to arrange testing (such as labs, EKG, etc.), we will make arrangements to do so. Although advances in technology are sophisticated, we cannot ensure that it will always work on either your end or our end. If the connection with a video visit is poor, the visit may have to be switched to a telephone visit. With either a video or telephone visit, we are not always able to ensure that we have a secure connection.  By engaging in this virtual visit, you consent to the provision of healthcare and authorize for your insurance to be billed (if applicable) for the services provided during this visit. Depending on your insurance coverage, you may receive a charge related to this service.  I need to obtain your verbal consent now. Are you willing to proceed with your visit today? Dominique Gonzalez has provided verbal consent on 01/03/2023 for a virtual visit (video or telephone). Margaretann Loveless, PA-C  Date: 01/03/2023 7:32 AM  Virtual Visit via Video Note   I, Margaretann Loveless, connected with  Dominique Gonzalez  ([id number], [birth year]) on 01/03/23 at  8:00 AM EDT by a video-enabled telemedicine application and verified that I am speaking with the correct person using two identifiers.  Location: Patient: Virtual Visit  Location Patient: Home Provider: Virtual Visit Location Provider: Home Office   I discussed the limitations of evaluation and management by telemedicine and the availability of in person appointments. The patient expressed understanding and agreed to proceed.    History of Present Illness: Dominique Gonzalez is a 72 y.o. who identifies as a female who was assigned female at birth, and is being seen today for possible sinus infection.  HPI: Sinusitis This is a new problem. The current episode started 1 to 4 weeks ago (ongoing for 3 weeks, worsened over the last 4 days). The problem has been gradually worsening since onset. There has been no fever. Associated symptoms include congestion, coughing (very little), ear pain (intermittent pressure), headaches and sinus pressure. Pertinent negatives include no chills, hoarse voice, shortness of breath or sore throat. (Post nasal drainage) Treatments tried: claritin, flonase, advils. The treatment provided no relief.     Problems:  Patient Active Problem List   Diagnosis Date Noted   Grief 12/05/2022   Rash 02/22/2022   Depression with anxiety 02/03/2022   Urinary frequency 09/14/2021   Costochondritis 08/29/2021   Influenza 07/16/2021   Right knee pain 04/25/2016   Dysuria 04/25/2015   Acute upper respiratory infection 04/25/2015   Acute frontal sinusitis 07/13/2014   UTI (urinary tract infection) 02/02/2014   Multiple thyroid nodules 05/25/2013   Fatigue 08/30/2011   URI (upper respiratory infection) 01/16/2011   VAGINITIS, ATROPHIC 10/09/2010   AORTIC VALVE DISORDERS 01/04/2010   ECHOCARDIOGRAM, ABNORMAL 12/12/2009   DIASTOLIC DYSFUNCTION 11/19/2009  Vitamin B12 deficiency 10/19/2009   FATIGUE 10/09/2009   CARDIAC MURMUR 10/09/2009   SINUSITIS - ACUTE-NOS 11/10/2008   URI 11/10/2008   LEG PAIN, RIGHT 10/14/2008   HIP PAIN, RIGHT 10/06/2008   Hyperlipidemia 02/25/2008   OSTEOPENIA 07/28/2007   Essential hypertension 02/25/2007    SHOULDER PAIN, LEFT 02/25/2007    Allergies:  Allergies  Allergen Reactions   Macrobid [Nitrofurantoin] Nausea And Vomiting    vomiting   Bactrim [Sulfamethoxazole-Trimethoprim] Hives   Medications:  Current Outpatient Medications:    amoxicillin-clavulanate (AUGMENTIN) 875-125 MG tablet, Take 1 tablet by mouth 2 (two) times daily., Disp: 20 tablet, Rfl: 0   aspirin 81 MG tablet, Take 81 mg by mouth daily.  , Disp: , Rfl:    lisinopril-hydrochlorothiazide (ZESTORETIC) 10-12.5 MG tablet, Take 1 tablet by mouth daily., Disp: 90 tablet, Rfl: 3   traZODone (DESYREL) 50 MG tablet, Take 0.5-1 tablets (25-50 mg total) by mouth at bedtime as needed for sleep., Disp: 90 tablet, Rfl: 0  Current Facility-Administered Medications:    cyanocobalamin ((VITAMIN B-12)) injection 1,000 mcg, 1,000 mcg, Intramuscular, Q30 days, Zola Button, [place] R, DO, 1,000 mcg at 02/22/22 1535  Observations/Objective: Patient is well-developed, well-nourished in no acute distress.  Resting comfortably at home.  Head is normocephalic, atraumatic.  No labored breathing.  Speech is clear and coherent with logical content.  Patient is alert and oriented at baseline.    Assessment and Plan: 1. Acute bacterial sinusitis - amoxicillin-clavulanate (AUGMENTIN) 875-125 MG tablet; Take 1 tablet by mouth 2 (two) times daily.  Dispense: 20 tablet; Refill: 0  - Worsening symptoms that have not responded to OTC medications.  - Will give Augmentin - Continue allergy medications.  - Steam and humidifier can help - Stay well hydrated and get plenty of rest.  - Seek in person evaluation if no symptom improvement or if symptoms worsen   Follow Up Instructions: I discussed the assessment and treatment plan with the patient. The patient was provided an opportunity to ask questions and all were answered. The patient agreed with the plan and demonstrated an understanding of the instructions.  A copy of instructions were sent to  the patient via MyChart unless otherwise noted below.    The patient was advised to call back or seek an in-person evaluation if the symptoms worsen or if the condition fails to improve as anticipated.  Time:  I spent 8 minutes with the patient via telehealth technology discussing the above problems/concerns.    Margaretann Loveless, PA-C

## 2023-01-03 NOTE — Patient Instructions (Signed)
Lulamae Pugh, thank you for joining Margaretann Loveless, PA-C for today's virtual visit.  While this provider is not your primary care provider (PCP), if your PCP is located in our provider database this encounter information will be shared with them immediately following your visit.   A [facility] MyChart account gives you access to today's visit and all your visits, tests, and labs performed at [facility] " click here if you don't have a [facility] MyChart account or go to [email or web address]  Consent: (Patient) Dominique Gonzalez provided verbal consent for this virtual visit at the beginning of the encounter.  Current Medications:  Current Outpatient Medications:    amoxicillin-clavulanate (AUGMENTIN) 875-125 MG tablet, Take 1 tablet by mouth 2 (two) times daily., Disp: 20 tablet, Rfl: 0   aspirin 81 MG tablet, Take 81 mg by mouth daily.  , Disp: , Rfl:    lisinopril-hydrochlorothiazide (ZESTORETIC) 10-12.5 MG tablet, Take 1 tablet by mouth daily., Disp: 90 tablet, Rfl: 3   traZODone (DESYREL) 50 MG tablet, Take 0.5-1 tablets (25-50 mg total) by mouth at bedtime as needed for sleep., Disp: 90 tablet, Rfl: 0  Current Facility-Administered Medications:    cyanocobalamin ((VITAMIN B-12)) injection 1,000 mcg, 1,000 mcg, Intramuscular, Q30 days, Zola Button, Yvonne R, DO, 1,000 mcg at 02/22/22 1535   Medications ordered in this encounter:  Meds ordered this encounter  Medications   amoxicillin-clavulanate (AUGMENTIN) 875-125 MG tablet    Sig: Take 1 tablet by mouth 2 (two) times daily.    Dispense:  20 tablet    Refill:  0    Order Specific Question:   Supervising Provider    Answer:   Merrilee Jansky [id number]     *If you need refills on other medications prior to your next appointment, please contact your pharmacy*  Follow-Up: Call back or seek an in-person evaluation if the symptoms worsen or if the condition fails to improve as anticipated.  [facility]  Virtual Care [phone]  Other Instructions  Sinus Infection, Adult A sinus infection, also called sinusitis, is inflammation of your sinuses. Sinuses are hollow spaces in the bones around your face. Your sinuses are located: Around your eyes. In the middle of your forehead. Behind your nose. In your cheekbones. Mucus normally drains out of your sinuses. When your nasal tissues become inflamed or swollen, mucus can become trapped or blocked. This allows bacteria, viruses, and fungi to grow, which leads to infection. Most infections of the sinuses are caused by a virus. A sinus infection can develop quickly. It can last for up to 4 weeks (acute) or for more than 12 weeks (chronic). A sinus infection often develops after a cold. What are the causes? This condition is caused by anything that creates swelling in the sinuses or stops mucus from draining. This includes: Allergies. Asthma. Infection from bacteria or viruses. Deformities or blockages in your nose or sinuses. Abnormal growths in the nose (nasal polyps). Pollutants, such as chemicals or irritants in the air. Infection from fungi. This is rare. What increases the risk? You are more likely to develop this condition if you: Have a weak body defense system (immune system). Do a lot of swimming or diving. Overuse nasal sprays. Smoke. What are the signs or symptoms? The main symptoms of this condition are pain and a feeling of pressure around the affected sinuses. Other symptoms include: Stuffy nose or congestion that makes it difficult to breathe through your nose. Thick yellow or greenish drainage from your nose.  Tenderness, swelling, and warmth over the affected sinuses. A cough that may get worse at night. Decreased sense of smell and taste. Extra mucus that collects in the throat or the back of the nose (postnasal drip) causing a sore throat or bad breath. Tiredness (fatigue). Fever. How is this diagnosed? This  condition is diagnosed based on: Your symptoms. Your medical history. A physical exam. Tests to find out if your condition is acute or chronic. This may include: Checking your nose for nasal polyps. Viewing your sinuses using a device that has a light (endoscope). Testing for allergies or bacteria. Imaging tests, such as an MRI or CT scan. In rare cases, a bone biopsy may be done to rule out more serious types of fungal sinus disease. How is this treated? Treatment for a sinus infection depends on the cause and whether your condition is chronic or acute. If caused by a virus, your symptoms should go away on their own within 10 days. You may be given medicines to relieve symptoms. They include: Medicines that shrink swollen nasal passages (decongestants). A spray that eases inflammation of the nostrils (topical intranasal corticosteroids). Rinses that help get rid of thick mucus in your nose (nasal saline washes). Medicines that treat allergies (antihistamines). Over-the-counter pain relievers. If caused by bacteria, your health care provider may recommend waiting to see if your symptoms improve. Most bacterial infections will get better without antibiotic medicine. You may be given antibiotics if you have: A severe infection. A weak immune system. If caused by narrow nasal passages or nasal polyps, surgery may be needed. Follow these instructions at home: Medicines Take, use, or apply over-the-counter and prescription medicines only as told by your health care provider. These may include nasal sprays. If you were prescribed an antibiotic medicine, take it as told by your health care provider. Do not stop taking the antibiotic even if you start to feel better. Hydrate and humidify  Drink enough fluid to keep your urine pale yellow. Staying hydrated will help to thin your mucus. Use a cool mist humidifier to keep the humidity level in your home above 50%. Inhale steam for 10-15 minutes,  3-4 times a day, or as told by your health care provider. You can do this in the bathroom while a hot shower is running. Limit your exposure to cool or dry air. Rest Rest as much as possible. Sleep with your head raised (elevated). Make sure you get enough sleep each night. General instructions  Apply a warm, moist washcloth to your face 3-4 times a day or as told by your health care provider. This will help with discomfort. Use nasal saline washes as often as told by your health care provider. Wash your hands often with soap and water to reduce your exposure to germs. If soap and water are not available, use hand sanitizer. Do not smoke. Avoid being around people who are smoking (secondhand smoke). Keep all follow-up visits. This is important. Contact a health care provider if: You have a fever. Your symptoms get worse. Your symptoms do not improve within 10 days. Get help right away if: You have a severe headache. You have persistent vomiting. You have severe pain or swelling around your face or eyes. You have vision problems. You develop confusion. Your neck is stiff. You have trouble breathing. These symptoms may be an emergency. Get help right away. Call 911. Do not wait to see if the symptoms will go away. Do not drive yourself to the hospital. Summary A  sinus infection is soreness and inflammation of your sinuses. Sinuses are hollow spaces in the bones around your face. This condition is caused by nasal tissues that become inflamed or swollen. The swelling traps or blocks the flow of mucus. This allows bacteria, viruses, and fungi to grow, which leads to infection. If you were prescribed an antibiotic medicine, take it as told by your health care provider. Do not stop taking the antibiotic even if you start to feel better. Keep all follow-up visits. This is important. This information is not intended to replace advice given to you by your health care provider. Make sure you  discuss any questions you have with your health care provider. Document Revised: 07/17/2021 Document Reviewed: 07/17/2021 Elsevier Patient Education  2023 Elsevier Inc.    If you have been instructed to have an in-person evaluation today at a local Urgent Care facility, please use the link below. It will take you to a list of all of our available [facility], including address, phone number and hours of operation. Please do not delay care.  [facility]  If you or a family member do not have a primary care provider, use the link below to schedule a visit and establish care. When you choose a [facility] primary care physician or advanced practice provider, you gain a long-term partner in health. Find a Primary Care Provider  Learn more about [facility]'s in-office and virtual care options: [facility] - Get Care Now

## 2023-01-21 DIAGNOSIS — Z1231 Encounter for screening mammogram for malignant neoplasm of breast: Secondary | ICD-10-CM | POA: Diagnosis not present

## 2023-01-21 LAB — HM MAMMOGRAPHY

## 2023-02-13 ENCOUNTER — Encounter: Payer: Self-pay | Admitting: Family Medicine

## 2023-02-13 NOTE — Telephone Encounter (Signed)
Please advise. B12 was checked 6 months ago and was normal.

## 2023-02-19 ENCOUNTER — Ambulatory Visit: Payer: PPO

## 2023-02-19 ENCOUNTER — Encounter: Payer: Self-pay | Admitting: Family Medicine

## 2023-02-24 NOTE — Progress Notes (Unsigned)
Dominique Gonzalez is a 72 y.o. female presents to the office today for Monthly B12 injection, per physician's orders. Original order: 02/13/23: She can do b12 monthly.  Cyanocobalamin 1000 mg/ml IM was administered R deltoid today. Patient tolerated injection. Patient due for follow up labs/provider appt: Yes.  Patient next injection due: 1 month, appt made Yes  Last B12 lab and OV was 01/2022 (has been seen virtually since then). Pt has been scheduled in 1 month for OV/B12 lab and injection  Creft, Melton Alar L

## 2023-02-25 ENCOUNTER — Ambulatory Visit (INDEPENDENT_AMBULATORY_CARE_PROVIDER_SITE_OTHER): Payer: PPO

## 2023-02-25 DIAGNOSIS — E538 Deficiency of other specified B group vitamins: Secondary | ICD-10-CM

## 2023-03-26 ENCOUNTER — Other Ambulatory Visit: Payer: Self-pay | Admitting: Family Medicine

## 2023-03-26 DIAGNOSIS — F4321 Adjustment disorder with depressed mood: Secondary | ICD-10-CM

## 2023-03-28 ENCOUNTER — Encounter: Payer: Self-pay | Admitting: Family Medicine

## 2023-03-28 ENCOUNTER — Ambulatory Visit: Payer: PPO | Admitting: Family Medicine

## 2023-03-28 VITALS — BP 110/70 | HR 77 | Temp 98.4°F | Resp 18 | Ht 64.0 in | Wt 163.8 lb

## 2023-03-28 DIAGNOSIS — I10 Essential (primary) hypertension: Secondary | ICD-10-CM | POA: Diagnosis not present

## 2023-03-28 DIAGNOSIS — E538 Deficiency of other specified B group vitamins: Secondary | ICD-10-CM

## 2023-03-28 LAB — COMPREHENSIVE METABOLIC PANEL
ALT: 16 U/L (ref 0–35)
AST: 21 U/L (ref 0–37)
Albumin: 4.1 g/dL (ref 3.5–5.2)
Alkaline Phosphatase: 104 U/L (ref 39–117)
BUN: 8 mg/dL (ref 6–23)
CO2: 26 mEq/L (ref 19–32)
Calcium: 9.1 mg/dL (ref 8.4–10.5)
Chloride: 104 mEq/L (ref 96–112)
Creatinine, Ser: 0.71 mg/dL (ref 0.40–1.20)
GFR: 85.98 mL/min (ref >60.00)
Glucose, Bld: 115 mg/dL — ABNORMAL HIGH (ref 70–99)
Potassium: 3.6 mEq/L (ref 3.5–5.1)
Sodium: 138 mEq/L (ref 135–145)
Total Bilirubin: 0.6 mg/dL (ref 0.2–1.2)
Total Protein: 6.6 g/dL (ref 6.0–8.3)

## 2023-03-28 LAB — CBC WITH DIFFERENTIAL/PLATELET
Basophils Absolute: 0.1 10*3/uL (ref 0.0–0.1)
Basophils Relative: 1.1 % (ref 0.0–3.0)
Eosinophils Absolute: 0.2 10*3/uL (ref 0.0–0.7)
Eosinophils Relative: 2.6 % (ref 0.0–5.0)
HCT: 40.8 % (ref 36.0–46.0)
Hemoglobin: 13.6 g/dL (ref 12.0–15.0)
Lymphocytes Relative: 28.2 % (ref 12.0–46.0)
Lymphs Abs: 2 10*3/uL (ref 0.7–4.0)
MCHC: 33.4 g/dL (ref 30.0–36.0)
MCV: 89.3 fl (ref 78.0–100.0)
Monocytes Absolute: 0.5 10*3/uL (ref 0.1–1.0)
Monocytes Relative: 7.5 % (ref 3.0–12.0)
Neutro Abs: 4.2 10*3/uL (ref 1.4–7.7)
Neutrophils Relative %: 60.6 % (ref 43.0–77.0)
Platelets: 241 10*3/uL (ref 150.0–400.0)
RBC: 4.57 Mil/uL (ref 3.87–5.11)
RDW: 13.4 % (ref 11.5–15.5)
WBC: 6.9 10*3/uL (ref 4.0–10.5)

## 2023-03-28 LAB — LIPID PANEL
Cholesterol: 159 mg/dL (ref 0–200)
HDL: 45.1 mg/dL (ref >39.00)
LDL Cholesterol: 95 mg/dL (ref 0–99)
NonHDL: 113.64
Total CHOL/HDL Ratio: 4
Triglycerides: 91 mg/dL (ref 0.0–149.0)
VLDL: 18.2 mg/dL (ref 0.0–40.0)

## 2023-03-28 LAB — TSH: TSH: 1 u[IU]/mL (ref 0.35–5.50)

## 2023-03-28 LAB — VITAMIN B12: Vitamin B-12: 515 pg/mL (ref 211–911)

## 2023-03-28 MED ORDER — CYANOCOBALAMIN 1000 MCG/ML IJ SOLN
1000.0000 ug | Freq: Once | INTRAMUSCULAR | Status: AC
Start: 2023-03-28 — End: 2023-03-28
  Administered 2023-03-28: 1000 ug via INTRAMUSCULAR

## 2023-03-28 NOTE — Assessment & Plan Note (Signed)
Recheck labs B12 shot given today prior to labs

## 2023-03-28 NOTE — Progress Notes (Signed)
Established Patient Office Visit  Subjective   Patient ID: Dominique Gonzalez, female    DOB: July 01, 1951  Age: 72 y.o. MRN: [id number]  Chief Complaint  Patient presents with   Depression    Pt reports no longer taking Trazodone   Follow-up    HPI Discussed the use of AI scribe software for clinical note transcription with the patient, who gave verbal consent to proceed.  History of Present Illness   The patient presents for a follow-up visit, initially scheduled for a B12 shot and sleep medicine. However, she is not currently taking the sleep medicine and is unsure if the visit was intended as a follow-up for this medication. She reports that she had a B12 shot last month and was told to return this month for another. She has not scheduled a physical exam.  The patient also discusses her ongoing grief process after the loss of her spouse. She expresses that she has good support and has found comfort in reading and grief counseling. She also mentions that her sister has moved in with her.      Patient Active Problem List   Diagnosis Date Noted   Grief 12/05/2022   Rash 02/22/2022   Depression with anxiety 02/03/2022   Urinary frequency 09/14/2021   Costochondritis 08/29/2021   Influenza 07/16/2021   Right knee pain 04/25/2016   Dysuria 04/25/2015   Acute upper respiratory infection 04/25/2015   Acute frontal sinusitis 07/13/2014   UTI (urinary tract infection) 02/02/2014   Multiple thyroid nodules 05/25/2013   Fatigue 08/30/2011   URI (upper respiratory infection) 01/16/2011   VAGINITIS, ATROPHIC 10/09/2010   AORTIC VALVE DISORDERS 01/04/2010   ECHOCARDIOGRAM, ABNORMAL 12/12/2009   DIASTOLIC DYSFUNCTION 11/19/2009   Vitamin B12 deficiency 10/19/2009   FATIGUE 10/09/2009   CARDIAC MURMUR 10/09/2009   SINUSITIS - ACUTE-NOS 11/10/2008   URI 11/10/2008   LEG PAIN, RIGHT 10/14/2008   HIP PAIN, RIGHT 10/06/2008   Hyperlipidemia 02/25/2008   OSTEOPENIA 07/28/2007    Essential hypertension 02/25/2007   SHOULDER PAIN, LEFT 02/25/2007   Past Medical History:  Diagnosis Date   Aortic insufficiency    mild   B12 deficiency    Cardiac murmur    Diastolic dysfunction    Fatigue    Hip pain, right    HTN (hypertension)    Hyperlipidemia    Leg pain, right    Osteopenia    Shoulder pain, left    Sinusitis, acute    NOS   URI (upper respiratory infection)    Past Surgical History:  Procedure Laterality Date   ABDOMINAL HYSTERECTOMY     age 55   BILATERAL OOPHORECTOMY     Social History   Tobacco Use   Smoking status: Former    Current packs/day: 0.00    Types: Cigarettes    Quit date: 01/10/1996    Years since quitting: 27.2   Smokeless tobacco: Never   Tobacco comments:    smoked for 10 years,   Vaping Use   Vaping status: Never Used  Substance Use Topics   Alcohol use: No   Drug use: No   Social History   Socioeconomic History   Marital status: Married    Spouse name: Not on file   Number of children: 2   Years of education: Not on file   Highest education level: Not on file  Occupational History   Not on file  Tobacco Use   Smoking status: Former    Current packs/day: 0.00  Types: Cigarettes    Quit date: 01/10/1996    Years since quitting: 27.2   Smokeless tobacco: Never   Tobacco comments:    smoked for 10 years,   Vaping Use   Vaping status: Never Used  Substance and Sexual Activity   Alcohol use: No   Drug use: No   Sexual activity: Not on file  Other Topics Concern   Not on file  Social History Narrative   Not on file   Social Determinants of Health   Financial Resource Strain: Patient Declined (03/27/2023)   Overall Financial Resource Strain (CARDIA)    Difficulty of Paying Living Expenses: Patient declined  Food Insecurity: Patient Declined (03/27/2023)   Hunger Vital Sign    Worried About Running Out of Food in the Last Year: Patient declined    Ran Out of Food in the Last Year: Patient declined   Transportation Needs: Patient Declined (03/27/2023)   PRAPARE - Administrator, Civil Service (Medical): Patient declined    Lack of Transportation (Non-Medical): Patient declined  Physical Activity: Sufficiently Active (05/21/2021)   Exercise Vital Sign    Days of Exercise per Week: 7 days    Minutes of Exercise per Session: 30 min  Stress: No Stress Concern Present (05/21/2021)   [employer] of Occupational Health - Occupational Stress Questionnaire    Feeling of Stress : Not at all  Social Connections: Unknown (03/27/2023)   Social Connection and Isolation Panel [NHANES]    Frequency of Communication with Friends and Family: Patient declined    Frequency of Social Gatherings with Friends and Family: Patient declined    Attends Religious Services: Patient declined    Database administrator or Organizations: Yes    Attends Banker Meetings: Patient declined    Marital Status: Separated  Intimate Partner Violence: Not At Risk (05/21/2021)   Humiliation, Afraid, Rape, and Kick questionnaire    Fear of Current or Ex-Partner: No    Emotionally Abused: No    Physically Abused: No    Sexually Abused: No   Family Status  Relation Name Status   Father  Deceased   Mother  Deceased   MGM  Deceased   MGF  Deceased   PGM  Deceased   PGF  Deceased  No partnership data on file   Family History  Problem Relation Age of Onset   Hypertension Father    Diabetes Father    Hypertension Mother    Breast cancer Mother    Allergies  Allergen Reactions   Macrobid [Nitrofurantoin] Nausea And Vomiting    vomiting   Bactrim [Sulfamethoxazole-Trimethoprim] Hives      Review of Systems  Constitutional:  Negative for fever and malaise/fatigue.  HENT:  Negative for congestion.   Eyes:  Negative for blurred vision.  Respiratory:  Negative for cough and shortness of breath.   Cardiovascular:  Negative for chest pain, palpitations and leg swelling.  Gastrointestinal:   Negative for vomiting.  Musculoskeletal:  Negative for back pain.  Skin:  Negative for rash.  Neurological:  Negative for loss of consciousness and headaches.      Objective:     BP 110/70 (BP Location: Left Arm, Patient Position: Sitting, Cuff Size: Normal)   Pulse 77   Temp 98.4 F (36.9 C) (Oral)   Resp 18   Ht 5\' 4"  (1.626 m)   Wt 163 lb 12.8 oz (74.3 kg)   LMP  (LMP Unknown)   SpO2 100%  BMI 28.12 kg/m  BP Readings from Last 3 Encounters:  03/28/23 110/70  09/23/22 116/78  02/22/22 116/70   Wt Readings from Last 3 Encounters:  03/28/23 163 lb 12.8 oz (74.3 kg)  09/23/22 173 lb (78.5 kg)  02/22/22 167 lb 6.4 oz (75.9 kg)   SpO2 Readings from Last 3 Encounters:  03/28/23 100%  09/23/22 98%  02/22/22 97%    Physical Exam Vitals and nursing note reviewed.  Constitutional:      General: She is not in acute distress.    Appearance: Normal appearance. She is well-developed.  HENT:     Head: Normocephalic and atraumatic.  Eyes:     General: No scleral icterus.       Right eye: No discharge.        Left eye: No discharge.  Cardiovascular:     Rate and Rhythm: Normal rate and regular rhythm.     Heart sounds: No murmur heard. Pulmonary:     Effort: Pulmonary effort is normal. No respiratory distress.     Breath sounds: Normal breath sounds.  Musculoskeletal:        General: Normal range of motion.     Cervical back: Normal range of motion and neck supple.     Right lower leg: No edema.     Left lower leg: No edema.  Skin:    General: Skin is warm and dry.  Neurological:     Mental Status: She is alert and oriented to person, place, and time.  Psychiatric:        Mood and Affect: Mood normal.        Behavior: Behavior normal.        Thought Content: Thought content normal.        Judgment: Judgment normal.      No results found for any visits on 03/28/23.  Last CBC Lab Results  Component Value Date   WBC 6.9 02/22/2022   HGB 13.7 02/22/2022    HCT 38.8 02/22/2022   MCV 88.0 02/22/2022   MCH 31.1 02/22/2022   RDW 12.9 02/22/2022   PLT 239 02/22/2022   Last metabolic panel Lab Results  Component Value Date   GLUCOSE 106 (H) 02/22/2022   NA 140 02/22/2022   K 3.9 02/22/2022   CL 106 02/22/2022   CO2 23 02/22/2022   BUN 13 02/22/2022   CREATININE 0.85 02/22/2022   EGFR 86 09/24/2021   CALCIUM 8.7 02/22/2022   PROT 6.5 02/22/2022   ALBUMIN 3.9 12/13/2020   BILITOT 0.5 02/22/2022   ALKPHOS 90 12/13/2020   AST 18 02/22/2022   ALT 13 02/22/2022   Last lipids Lab Results  Component Value Date   CHOL 154 02/22/2022   HDL 43 (L) 02/22/2022   LDLCALC 87 02/22/2022   LDLDIRECT 149.3 07/28/2007   TRIG 143 02/22/2022   CHOLHDL 3.6 02/22/2022   Last hemoglobin A1c Lab Results  Component Value Date   HGBA1C 5.2 12/13/2020   Last thyroid functions Lab Results  Component Value Date   TSH 1.300 09/24/2021   Last vitamin D Lab Results  Component Value Date   VD25OH 10 (L) 10/09/2010   Last vitamin B12 and Folate Lab Results  Component Value Date   VITAMINB12 672 02/22/2022   FOLATE 21.7 10/09/2010      The 10-year ASCVD risk score (Arnett DK, et al., 2019) is: 9.2%    Assessment & Plan:   Problem List Items Addressed This Visit  Unprioritized   Vitamin B12 deficiency    Recheck labs B12 shot given today prior to labs       Other Visit Diagnoses     B12 deficiency    -  Primary   Relevant Medications   cyanocobalamin (VITAMIN B12) injection 1,000 mcg (Completed)   Other Relevant Orders   Vitamin B12   Primary hypertension       Relevant Orders   CBC with Differential/Platelet   Comprehensive metabolic panel   Lipid panel   TSH       No follow-ups on file.    Donato Schultz, DO

## 2023-04-30 ENCOUNTER — Telehealth: Payer: PPO | Admitting: Physician Assistant

## 2023-04-30 ENCOUNTER — Ambulatory Visit: Payer: PPO

## 2023-04-30 DIAGNOSIS — H9203 Otalgia, bilateral: Secondary | ICD-10-CM

## 2023-04-30 MED ORDER — FLUTICASONE PROPIONATE 50 MCG/ACT NA SUSP: 2.0000 | Freq: Every day | NASAL | 0 refills | Status: DC

## 2023-04-30 MED ORDER — AMOXICILLIN 875 MG PO TABS: 875.0000 mg | ORAL_TABLET | Freq: Two times a day (BID) | ORAL | 0 refills | Status: AC

## 2023-04-30 NOTE — Progress Notes (Signed)
I have spent 5 minutes in review of e-visit questionnaire, review and updating patient chart, medical decision making and response to patient.   William Cody Martin, PA-C    

## 2023-04-30 NOTE — Progress Notes (Signed)
E-Visit for Ear Pain - Eustachian Tube Dysfunction   We are sorry that you are not feeling well. Here is how we plan to help!  Based on what you have shared with me it looks like you have Eustachian Tube Dysfunction.  Eustachian Tube Dysfunction is a condition where the tubes that connect your middle ears to your upper throat become blocked. This can lead to discomfort, hearing difficulties and a feeling of fullness in your ear. Eustachian tube dysfunction usually resolves itself in a few days. The usual symptoms include: Hearing problems Tinnitus, or ringing in your ears Clicking or popping sounds A feeling of fullness in your ears Pain that mimics an ear infection Dizziness, vertigo or balance problems A "tickling" sensation in your ears  ?Eustachian tube dysfunction symptoms may get worse in higher altitudes. This is called barotrauma, and it can happen while scuba diving, flying in an airplane or driving in the mountains.   What causes eustachian tube dysfunction? Allergies and infections (like the common cold and the flu) are the most common causes of eustachian tube dysfunction. These conditions can cause inflammation and mucus buildup, leading to blockage. GERD, or chronic acid reflux, can also cause ETD. This is because stomach acid can back up into your throat and result in inflammation. As mentioned above, altitude changes can also cause ETD.   What are some common eustachian tube dysfunction treatments? In most cases, treatment isn't necessary because ETD often resolves on its own. However, you might need treatment if your symptoms linger for more than two weeks.    Eustachian tube dysfunction treatment depends on the cause and the severity of your condition. Treatments may include home remedies, medications or, in severe cases, surgery.     HOME CARE: Sometimes simple home remedies can help with mild cases of eustachian tube dysfunction. To try and clear the blockage, you  can: Chew gum. Yawn. Swallow. Try the Valsalva maneuver (breathing out forcefully while closing your mouth and pinching your nostrils). Use a saline spray to clear out nasal passages.  MEDICATIONS: Over-the-counter medications can help if allergies are causing eustachian tube dysfunction. Try antihistamines (like cetirizine or diphenhydramine) to ease your symptoms. If you have discomfort, pain relievers -- such as acetaminophen or ibuprofen -- can help.  Sometimes intranasal glucocorticosteroids (like Flonase or Nasacort) help.  I have prescribed Fluticasone 50 mcg/spray 2 sprays in each nostril daily for 10-14 days  I am concerned giving pain that you have developed a secondary ear infection. As such, I am also prescribing an antibiotic, Amoxicillin, to take as directed.    GET HELP RIGHT AWAY IF: Fever is over 102.2 degrees. You develop progressive ear pain or hearing loss. Ear symptoms persist longer than 3 days after treatment.  MAKE SURE YOU: Understand these instructions. Will watch your condition. Will get help right away if you are not doing well or get worse.  Thank you for choosing an e-visit.  Your e-visit answers were reviewed by a board certified advanced clinical practitioner to complete your personal care plan. Depending upon the condition, your plan could have included both over the counter or prescription medications.  Please review your pharmacy choice. Make sure the pharmacy is open so you can pick up the prescription now. If there is a problem, you may contact your provider through [employer] and have the prescription routed to another pharmacy.  Your safety is important to [place]. If you have drug allergies check your prescription carefully.   For the next  24 hours you can use MyChart to ask questions about today's visit, request a non-urgent call back, or ask for a work or school excuse. You will get an email with a survey after your eVisit asking about your  experience. We would appreciate your feedback. I hope that your e-visit has been valuable and will aid in your recovery.

## 2023-05-06 ENCOUNTER — Encounter: Payer: Self-pay | Admitting: Family Medicine

## 2023-05-07 ENCOUNTER — Ambulatory Visit: Payer: PPO

## 2023-05-20 ENCOUNTER — Ambulatory Visit: Payer: PPO

## 2023-05-20 ENCOUNTER — Encounter: Payer: Self-pay | Admitting: Family Medicine

## 2023-05-20 NOTE — Progress Notes (Deleted)
Dominique Gonzalez is a 72 y.o. female presents to the office today for Monthly B12 injection, per physician's orders. Original order: "03/28/23: Your labs look good." Cyanocobalamin 1000 mg/ml IM was administered *** deltoid today. Patient tolerated injection. Patient due for follow up labs/provider appt: No. Date due: 09/2023, appt made No Patient next injection due: 1 month, appt made {yes/no:20286}  Creft, Feliberto Harts   DOD: Abner Greenspan

## 2023-06-17 ENCOUNTER — Other Ambulatory Visit: Payer: Self-pay | Admitting: Family Medicine

## 2023-06-17 DIAGNOSIS — F4321 Adjustment disorder with depressed mood: Secondary | ICD-10-CM

## 2023-06-18 ENCOUNTER — Other Ambulatory Visit (INDEPENDENT_AMBULATORY_CARE_PROVIDER_SITE_OTHER): Payer: PPO

## 2023-06-18 ENCOUNTER — Ambulatory Visit: Payer: PPO

## 2023-06-18 ENCOUNTER — Telehealth: Payer: Self-pay

## 2023-06-18 DIAGNOSIS — E538 Deficiency of other specified B group vitamins: Secondary | ICD-10-CM

## 2023-06-18 LAB — VITAMIN B12: Vitamin B-12: 519 pg/mL (ref 211–911)

## 2023-06-18 NOTE — Progress Notes (Signed)
Dominique Gonzalez is a 72 y.o. female presents to the office today for Monthly B12 injection, per physician's orders. Original order:  Cyanocobalamin 1000 mg  . Patient don't received B-12 injection due lip, numbness and tongue tingling after shot before. Patient due for follow up labs/provider appt: Yes and ordered recheck B-12    Per DOD Dr.Wendling to hold off giving  B-12 injection until repeat labs and office visit with provider. Both appointment scheduled  and  labs done today. Pt has been scheduled to discuss with Dr.Lowne if any allergies no allergic reaction.  Kumari Sculley,Justyce Yeater ,CMA

## 2023-06-18 NOTE — Telephone Encounter (Signed)
done

## 2023-06-19 ENCOUNTER — Other Ambulatory Visit: Payer: Self-pay | Admitting: Family Medicine

## 2023-06-19 DIAGNOSIS — F4321 Adjustment disorder with depressed mood: Secondary | ICD-10-CM

## 2023-06-23 ENCOUNTER — Ambulatory Visit: Payer: PPO | Admitting: Family Medicine

## 2023-06-30 ENCOUNTER — Telehealth: Payer: PPO | Admitting: Physician Assistant

## 2023-06-30 DIAGNOSIS — B9689 Other specified bacterial agents as the cause of diseases classified elsewhere: Secondary | ICD-10-CM

## 2023-06-30 DIAGNOSIS — J019 Acute sinusitis, unspecified: Secondary | ICD-10-CM | POA: Diagnosis not present

## 2023-06-30 MED ORDER — AMOXICILLIN-POT CLAVULANATE 875-125 MG PO TABS: 1.0000 | ORAL_TABLET | Freq: Two times a day (BID) | ORAL | 0 refills | Status: DC

## 2023-06-30 MED ORDER — DOXYCYCLINE HYCLATE 100 MG PO TABS
100.0000 mg | ORAL_TABLET | Freq: Two times a day (BID) | ORAL | 0 refills | Status: DC
Start: 2023-06-30 — End: 2023-06-30

## 2023-06-30 NOTE — Addendum Note (Signed)
Addended by: Margaretann Loveless on: 06/30/2023 09:23 AM   Modules accepted: Orders

## 2023-06-30 NOTE — Progress Notes (Signed)

## 2023-09-03 ENCOUNTER — Encounter: Payer: Self-pay | Admitting: Family Medicine

## 2023-09-03 DIAGNOSIS — I10 Essential (primary) hypertension: Secondary | ICD-10-CM

## 2023-09-18 ENCOUNTER — Ambulatory Visit: Payer: PPO | Attending: Cardiovascular Disease | Admitting: Cardiovascular Disease

## 2023-09-18 ENCOUNTER — Encounter: Payer: Self-pay | Admitting: Cardiovascular Disease

## 2023-09-18 VITALS — BP 130/82 | HR 74 | Ht 64.0 in | Wt 164.8 lb

## 2023-09-18 DIAGNOSIS — I351 Nonrheumatic aortic (valve) insufficiency: Secondary | ICD-10-CM | POA: Diagnosis not present

## 2023-09-18 DIAGNOSIS — I10 Essential (primary) hypertension: Secondary | ICD-10-CM | POA: Diagnosis not present

## 2023-09-18 MED ORDER — LISINOPRIL-HYDROCHLOROTHIAZIDE 10-12.5 MG PO TABS: 1.0000 | ORAL_TABLET | Freq: Every day | ORAL | 3 refills | Status: DC

## 2023-09-18 NOTE — Patient Instructions (Signed)
Medication Instructions:  No changes *If you need a refill on your cardiac medications before your next appointment, please call your pharmacy*   Lab Work: none   Testing/Procedures: Your physician has requested that you have an echocardiogram. Echocardiography is a painless test that uses sound waves to create images of your heart. It provides your doctor with information about the size and shape of your heart and how well your heart's chambers and valves are working. This procedure takes approximately one hour. There are no restrictions for this procedure. Please do NOT wear cologne, perfume, aftershave, or lotions (deodorant is allowed). Please arrive 15 minutes prior to your appointment time.  Please note: We ask at that you not bring children with you during ultrasound (echo/ vascular) testing. Due to room size and safety concerns, children are not allowed in the ultrasound rooms during exams. Our front office staff cannot provide observation of children in our lobby area while testing is being conducted. An adult accompanying a patient to their appointment will only be allowed in the ultrasound room at the discretion of the ultrasound technician under special circumstances. We apologize for any inconvenience.   Follow-Up: At [facility], you and your health needs are our priority.  As part of our continuing mission to provide you with exceptional heart care, we have created designated Provider Care Teams.  These Care Teams include your primary Cardiologist (physician) and Advanced Practice Providers (APPs -  Physician Assistants and Nurse Practitioners) who all work together to provide you with the care you need, when you need it.   Your next appointment:   12 month(s)  Provider:   Verne Carrow, MD      1st Floor: - Lobby - Registration  - Pharmacy  - Lab - Cafe  2nd Floor: - PV Lab - Diagnostic Testing (echo, CT, nuclear med)  3rd Floor: -  [facility]  4th Floor: - TCTS (cardiothoracic surgery) - AFib Clinic - Structural Heart Clinic - Vascular Surgery  - Vascular Ultrasound  5th Floor: - [facility] (general and EP) - Clinical Pharmacy for coumadin, hypertension, lipid, weight-loss medications, and med management appointments    Valet parking services will be available as well.

## 2023-09-18 NOTE — Progress Notes (Signed)
Chief Complaint  Patient presents with   Follow-up    Aortic valve insufficiency   History of Present Illness: 73 yo female with history of aortic valve insufficiency, mitral regurgitation and HTN here today for follow up. She has been followed in my office since 2011 when she was found to have mild aortic valve insufficiency. Echo in 2011 showed normal LV systolic function, diastolic dysfunction, mild AI, small effusion and possible epicardial mass/aortic root mass. I ordered a cardiac MRI which showed prominent epicardial fat pad with no evidence of aortic or pericardial tumor or mass. Most recent echo February 2023 with LVEF=55-60%, trivial MR, mild AI.   She is here today for follow up. The patient denies any chest pain, dyspnea, palpitations, lower extremity edema, orthopnea, PND, dizziness, near syncope or syncope.    Primary Care Physician:  Zola Button, Grayling Congress, DO  Past Medical History:  Diagnosis Date   Aortic insufficiency    mild   B12 deficiency    Cardiac murmur    Diastolic dysfunction    Fatigue    Hip pain, right    HTN (hypertension)    Hyperlipidemia    Leg pain, right    Osteopenia    Shoulder pain, left    Sinusitis, acute    NOS   URI (upper respiratory infection)     Past Surgical History:  Procedure Laterality Date   ABDOMINAL HYSTERECTOMY     age 25   BILATERAL OOPHORECTOMY      Current Outpatient Medications  Medication Sig Dispense Refill   aspirin 81 MG tablet Take 81 mg by mouth daily.       fluticasone (FLONASE) 50 MCG/ACT nasal spray Place 2 sprays into both nostrils daily. 16 g 0   lisinopril-hydrochlorothiazide (ZESTORETIC) 10-12.5 MG tablet Take 1 tablet by mouth daily. 90 tablet 3   Current Facility-Administered Medications  Medication Dose Route Frequency Provider Last Rate Last Admin   cyanocobalamin ((VITAMIN B-12)) injection 1,000 mcg  1,000 mcg Intramuscular Q30 days Zola Button, Yvonne R, DO   1,000 mcg at 02/25/23 1021     Allergies  Allergen Reactions   Macrobid [Nitrofurantoin] Nausea And Vomiting    vomiting   Bactrim [Sulfamethoxazole-Trimethoprim] Hives    Social History   Socioeconomic History   Marital status: Married    Spouse name: Not on file   Number of children: 2   Years of education: Not on file   Highest education level: Not on file  Occupational History   Not on file  Tobacco Use   Smoking status: Former    Current packs/day: 0.00    Types: Cigarettes    Quit date: 01/10/1996    Years since quitting: 27.7   Smokeless tobacco: Never   Tobacco comments:    smoked for 10 years,   Vaping Use   Vaping status: Never Used  Substance and Sexual Activity   Alcohol use: No   Drug use: No   Sexual activity: Not on file  Other Topics Concern   Not on file  Social History Narrative   Not on file   Social Drivers of Health   Financial Resource Strain: Patient Declined (03/27/2023)   Overall Financial Resource Strain (CARDIA)    Difficulty of Paying Living Expenses: Patient declined  Food Insecurity: Patient Declined (03/27/2023)   Hunger Vital Sign    Worried About Running Out of Food in the Last Year: Patient declined    Ran Out of Food in the Last Year:  Patient declined  Transportation Needs: Patient Declined (03/27/2023)   PRAPARE - Administrator, Civil Service (Medical): Patient declined    Lack of Transportation (Non-Medical): Patient declined  Physical Activity: Sufficiently Active (05/21/2021)   Exercise Vital Sign    Days of Exercise per Week: 7 days    Minutes of Exercise per Session: 30 min  Stress: No Stress Concern Present (05/21/2021)   [employer] of Occupational Health - Occupational Stress Questionnaire    Feeling of Stress : Not at all  Social Connections: Unknown (03/27/2023)   Social Connection and Isolation Panel [NHANES]    Frequency of Communication with Friends and Family: Patient declined    Frequency of Social Gatherings with Friends  and Family: Patient declined    Attends Religious Services: Patient declined    Database administrator or Organizations: Yes    Attends Banker Meetings: Patient declined    Marital Status: Separated  Intimate Partner Violence: Not At Risk (05/21/2021)   Humiliation, Afraid, Rape, and Kick questionnaire    Fear of Current or Ex-Partner: No    Emotionally Abused: No    Physically Abused: No    Sexually Abused: No    Family History  Problem Relation Age of Onset   Hypertension Father    Diabetes Father    Hypertension Mother    Breast cancer Mother     Review of Systems:  As stated in the HPI and otherwise negative.   BP 130/82   Pulse 74   Ht 5\' 4"  (1.626 m)   Wt 74.8 kg   LMP  (LMP Unknown)   SpO2 99%   BMI 28.29 kg/m   Physical Examination:  General: Well developed, well nourished, NAD  HEENT: OP clear, mucus membranes moist  SKIN: warm, dry. No rashes. Neuro: No focal deficits  Musculoskeletal: Muscle strength 5/5 all ext  Psychiatric: Mood and affect normal  Neck: No JVD, no carotid bruits, no thyromegaly, no lymphadenopathy.  Lungs:Clear bilaterally, no wheezes, rhonci, crackles Cardiovascular: Regular rate and rhythm. Diastolic murmur, soft.  Abdomen:Soft. Bowel sounds present. Non-tender.  Extremities: No lower extremity edema. Pulses are 2 + in the bilateral DP/PT.  EKG:  EKG is ordered today. The ekg ordered today demonstrates EKG Interpretation Date/Time:  Thursday September 18 2023 11:11:55 EST Ventricular Rate:  79 PR Interval:  154 QRS Duration:  86 QT Interval:  418 QTC Calculation: 479 R Axis:   -2  Text Interpretation: Normal sinus rhythm Normal ECG No previous ECGs available Confirmed by Verne Carrow [phone]) on 09/18/2023 11:15:53 AM    Recent Labs: 03/28/2023: ALT 16; BUN 8; Creatinine, Ser 0.71; Hemoglobin 13.6; Platelets 241.0; Potassium 3.6; Sodium 138; TSH 1.00   Lipid Panel    Component Value Date/Time   CHOL 159  03/28/2023 1123   TRIG 91.0 03/28/2023 1123   HDL 45.10 03/28/2023 1123   CHOLHDL 4 03/28/2023 1123   VLDL 18.2 03/28/2023 1123   LDLCALC 95 03/28/2023 1123   LDLCALC 87 02/22/2022 1527   LDLDIRECT 149.3 07/28/2007 0000     Wt Readings from Last 3 Encounters:  09/18/23 74.8 kg  03/28/23 74.3 kg  09/23/22 78.5 kg    Assessment and Plan:   1. AORTIC VALVE INSUFFICIENCY: Mild by echo in February 2023. Will repeat echo in February 2026.   2. Mitral valve regurgitation: No significant MR by echo February 2023  3. HTN: BP is controlled. No changes today. Refill Zestoretic.   Labs/ tests ordered today  include:   Orders Placed This Encounter  Procedures   EKG 12-Lead   ECHOCARDIOGRAM COMPLETE   Disposition:   F/U with me in 12  months  Signed, Verne Carrow, MD 09/18/2023 11:52 AM    [facility] [address] [place], [place], [place]  [address] Phone: [phone]; Fax: [phone]

## 2023-09-22 DIAGNOSIS — M25552 Pain in left hip: Secondary | ICD-10-CM | POA: Diagnosis not present

## 2023-09-22 DIAGNOSIS — M7062 Trochanteric bursitis, left hip: Secondary | ICD-10-CM | POA: Diagnosis not present

## 2023-10-06 ENCOUNTER — Ambulatory Visit (HOSPITAL_COMMUNITY): Payer: PPO | Attending: Cardiovascular Disease

## 2023-10-06 DIAGNOSIS — I351 Nonrheumatic aortic (valve) insufficiency: Secondary | ICD-10-CM | POA: Diagnosis not present

## 2023-10-06 LAB — ECHOCARDIOGRAM COMPLETE
Area-P 1/2: 3.53 cm2
P 1/2 time: 1386 ms
S' Lateral: 3 cm

## 2023-10-07 ENCOUNTER — Encounter: Payer: Self-pay | Admitting: Cardiovascular Disease

## 2023-11-26 ENCOUNTER — Encounter: Payer: Self-pay | Admitting: Family Medicine

## 2023-12-25 ENCOUNTER — Encounter: Payer: Self-pay | Admitting: Cardiovascular Disease

## 2024-01-27 DIAGNOSIS — Z1231 Encounter for screening mammogram for malignant neoplasm of breast: Secondary | ICD-10-CM | POA: Diagnosis not present

## 2024-01-27 LAB — HM MAMMOGRAPHY

## 2024-01-29 ENCOUNTER — Encounter: Payer: Self-pay | Admitting: Family Medicine

## 2024-02-11 ENCOUNTER — Telehealth: Admitting: Physician Assistant

## 2024-02-11 DIAGNOSIS — B9689 Other specified bacterial agents as the cause of diseases classified elsewhere: Secondary | ICD-10-CM | POA: Diagnosis not present

## 2024-02-11 DIAGNOSIS — J019 Acute sinusitis, unspecified: Secondary | ICD-10-CM

## 2024-02-11 MED ORDER — AMOXICILLIN-POT CLAVULANATE 875-125 MG PO TABS: 1.0000 | ORAL_TABLET | Freq: Two times a day (BID) | ORAL | 0 refills | Status: DC

## 2024-02-11 NOTE — Progress Notes (Signed)
 I have spent 5 minutes in review of e-visit questionnaire, review and updating patient chart, medical decision making and response to patient.   Piedad Climes, PA-C

## 2024-02-11 NOTE — Progress Notes (Signed)

## 2024-02-11 NOTE — Progress Notes (Signed)
 Message sent to patient requesting further input regarding current symptoms. Awaiting patient response.

## 2024-02-17 ENCOUNTER — Ambulatory Visit (INDEPENDENT_AMBULATORY_CARE_PROVIDER_SITE_OTHER): Admitting: Medical

## 2024-02-17 ENCOUNTER — Ambulatory Visit: Payer: Self-pay | Admitting: Medical

## 2024-02-17 ENCOUNTER — Other Ambulatory Visit (HOSPITAL_BASED_OUTPATIENT_CLINIC_OR_DEPARTMENT_OTHER): Payer: Self-pay

## 2024-02-17 ENCOUNTER — Encounter: Payer: Self-pay | Admitting: Family Medicine

## 2024-02-17 VITALS — BP 124/62 | HR 88 | Temp 97.9°F | Resp 18 | Ht 64.0 in | Wt 170.0 lb

## 2024-02-17 DIAGNOSIS — R110 Nausea: Secondary | ICD-10-CM | POA: Diagnosis not present

## 2024-02-17 DIAGNOSIS — R300 Dysuria: Secondary | ICD-10-CM | POA: Diagnosis not present

## 2024-02-17 LAB — POCT URINALYSIS DIPSTICK
Bilirubin, UA: NEGATIVE
Blood, UA: NEGATIVE
Glucose, UA: NEGATIVE
Ketones, UA: NEGATIVE
Leukocytes, UA: NEGATIVE
Nitrite, UA: NEGATIVE
Protein, UA: NEGATIVE
Spec Grav, UA: <=1.005 — AB (ref 1.010–1.025)
Urobilinogen, UA: 0.2 U/dL
pH, UA: 6 (ref 5.0–8.0)

## 2024-02-17 MED ORDER — PROMETHAZINE HCL 12.5 MG PO TABS
12.5000 mg | ORAL_TABLET | Freq: Three times a day (TID) | ORAL | 0 refills | Status: DC | PRN
  Filled 2024-02-17: qty 12, 4d supply, fill #0

## 2024-02-17 NOTE — Patient Instructions (Signed)
 Urinary Tract Infection (UTI) Symptoms suggestive of UTI, but urine test negative for leukocytes, nitrates, and hematuria. Current Augmentin  may not cover resistant bacteria. Discussed Rocephin(offered injection today but declined) so if symptoms worsen pending culture and sensitivity can give later.. Allergic to Macrobid  and Bactrim. Quinolone not convinced indicated presently. - Send urine culture for bacteria identification and sensitivity. - Continue Augmentin  for sinus infection and may give some uti coverage. - Advise hydration. - Instruct to report worsening symptoms. - Consider Rocephin injection if symptoms worsen. - Discuss levofloxacin if culture indicates resistance.  Sinus Infection Improvement in sinus pressure with Augmentin . Continuing treatment to avoid gastrointestinal side effects from multiple antibiotics. - Continue Augmentin  for sinus infection.  Medication Allergies Allergic to Macrobid  and Bactrim, limiting antibiotic options for UTI. - Avoid Macrobid  and Bactrim.  Reminder if urinary symptoms worsen up date me for nurse visit rocephin injection  Follow up date to be determined after lab review or sooner if needed

## 2024-02-17 NOTE — Progress Notes (Signed)
 Subjective:    Patient ID: Dominique Gonzalez, female    DOB: [birth year], 73 y.o.   MRN: [id number]  HPI  Dominique Gonzalez is a 73 year old female who presents with urinary symptoms and back pain.  She has been experiencing urinary symptoms and back pain for approximately one week. The back pain is localized to the lower back, without radiation, and has been worsening. She feels generally unwell. Mild burning during urination, pain over the bladder, pressure over the bladder. No itching or white discharge.  She has a history of urinary complaints dating back to 2018. Over-the-counter test kits indicated a urinary tract infection over the past three days, with the last test conducted yesterday. Despite these results, her urine test today showed leukocytes and nitrates as negative, and no blood was present.  She has been taking Augmentin  for a sinus infection for the past four days, which has improved her sinus symptoms, although she still experiences some pressure. She has not taken any Augmentin  today. She previously took amoxicillin  for a sinus infection about a month ago.  She has a known allergy to Macrobid  and Bactrim, which are antibiotics typically used for urinary tract infections.  In the review of symptoms, she reports nausea but denies vomiting or fever. No pain or pressure over the bladder, pain over the kidneys, or severe bladder pain.     Review of Systems  Constitutional:  Negative for chills, fatigue and fever.  HENT:  Negative for dental problem.   Respiratory:  Negative for cough, chest tightness, shortness of breath and wheezing.   Cardiovascular:  Negative for chest pain and palpitations.  Gastrointestinal:  Positive for nausea. Negative for abdominal pain, blood in stool and vomiting.  Genitourinary:  Positive for dysuria. Negative for dyspareunia, frequency and pelvic pain.  Musculoskeletal:  Negative for back pain, joint swelling and neck stiffness.       Low back  pain  Skin:  Negative for wound.  Neurological:  Negative for dizziness, syncope, weakness and numbness.  Hematological:  Negative for adenopathy. Does not bruise/bleed easily.    Past Medical History:  Diagnosis Date   Aortic insufficiency    mild   B12 deficiency    Cardiac murmur    Diastolic dysfunction    Fatigue    Hip pain, right    HTN (hypertension)    Hyperlipidemia    Leg pain, right    Osteopenia    Shoulder pain, left    Sinusitis, acute    NOS   URI (upper respiratory infection)      Social History   Socioeconomic History   Marital status: Married    Spouse name: Not on file   Number of children: 2   Years of education: Not on file   Highest education level: 12th grade  Occupational History   Not on file  Tobacco Use   Smoking status: Former    Current packs/day: 0.00    Types: Cigarettes    Quit date: 01/10/1996    Years since quitting: 28.1   Smokeless tobacco: Never   Tobacco comments:    smoked for 10 years,   Vaping Use   Vaping status: Never Used  Substance and Sexual Activity   Alcohol use: No   Drug use: No   Sexual activity: Not on file  Other Topics Concern   Not on file  Social History Narrative   Not on file   Social Drivers of Health   Financial Resource Strain: Patient  Declined (02/17/2024)   Overall Financial Resource Strain (CARDIA)    Difficulty of Paying Living Expenses: Patient declined  Food Insecurity: Patient Declined (02/17/2024)   Hunger Vital Sign    Worried About Running Out of Food in the Last Year: Patient declined    Ran Out of Food in the Last Year: Patient declined  Transportation Needs: No Transportation Needs (02/17/2024)   PRAPARE - Administrator, Civil Service (Medical): No    Lack of Transportation (Non-Medical): No  Physical Activity: Insufficiently Active (02/17/2024)   Exercise Vital Sign    Days of Exercise per Week: 7 days    Minutes of Exercise per Session: 20 min  Stress: No Stress  Concern Present (02/17/2024)   [employer] of Occupational Health - Occupational Stress Questionnaire    Feeling of Stress: Only a little  Social Connections: Unknown (02/17/2024)   Social Connection and Isolation Panel    Frequency of Communication with Friends and Family: Patient declined    Frequency of Social Gatherings with Friends and Family: Patient declined    Attends Religious Services: More than 4 times per year    Active Member of [employer] or Organizations: Patient declined    Attends Banker Meetings: Not on file    Marital Status: Widowed  Intimate Partner Violence: Not At Risk (05/21/2021)   Humiliation, Afraid, Rape, and Kick questionnaire    Fear of Current or Ex-Partner: No    Emotionally Abused: No    Physically Abused: No    Sexually Abused: No    Past Surgical History:  Procedure Laterality Date   ABDOMINAL HYSTERECTOMY     age 34   BILATERAL OOPHORECTOMY      Family History  Problem Relation Age of Onset   Hypertension Father    Diabetes Father    Hypertension Mother    Breast cancer Mother     Allergies  Allergen Reactions   Macrobid  [Nitrofurantoin ] Nausea And Vomiting    vomiting   Bactrim [Sulfamethoxazole-Trimethoprim] Hives    Current Outpatient Medications on File Prior to Visit  Medication Sig Dispense Refill   amoxicillin -clavulanate (AUGMENTIN ) 875-125 MG tablet Take 1 tablet by mouth 2 (two) times daily. 14 tablet 0   aspirin 81 MG tablet Take 81 mg by mouth daily.       fluticasone  (FLONASE ) 50 MCG/ACT nasal spray Place 2 sprays into both nostrils daily. 16 g 0   lisinopril -hydrochlorothiazide  (ZESTORETIC ) 10-12.5 MG tablet Take 1 tablet by mouth daily. 90 tablet 3   Current Facility-Administered Medications on File Prior to Visit  Medication Dose Route Frequency Provider Last Rate Last Admin   cyanocobalamin  ((VITAMIN B-12)) injection 1,000 mcg  1,000 mcg Intramuscular Q30 days Antonio Meth, Yvonne R, DO   1,000 mcg at  02/25/23 1021    BP 124/62   Pulse 88   Temp 97.9 F (36.6 C)   Resp 18   Ht 5' 4 (1.626 m)   Wt 170 lb (77.1 kg)   LMP  (LMP Unknown)   SpO2 96%   BMI 29.18 kg/m        Objective:   Physical Exam   General- No acute distress. Pleasant patient. Neck- Full range of motion, no jvd Lungs- Clear, even and unlabored. Heart- regular rate and rhythm. Neurologic- CNII- XII grossly intact.      Assessment & Plan:   Patient Instructions  Urinary Tract Infection (UTI) Symptoms suggestive of UTI, but urine test negative for leukocytes, nitrates, and hematuria. Current  Augmentin  may not cover resistant bacteria. Discussed Rocephin(offered injection today but declined) so if symptoms worsen pending culture and sensitivity can give later.. Allergic to Macrobid  and Bactrim. Quinolone not convinced indicated presently. - Send urine culture for bacteria identification and sensitivity. - Continue Augmentin  for sinus infection and may give some uti coverage. - Advise hydration. - Instruct to report worsening symptoms. - Consider Rocephin injection if symptoms worsen. - Discuss levofloxacin if culture indicates resistance.  Sinus Infection Improvement in sinus pressure with Augmentin . Continuing treatment to avoid gastrointestinal side effects from multiple antibiotics. - Continue Augmentin  for sinus infection.  Medication Allergies Allergic to Macrobid  and Bactrim, limiting antibiotic options for UTI. - Avoid Macrobid  and Bactrim.  Reminder if urinary symptoms worsen up date me for nurse visit rocephin injection  Follow up date to be determined after lab review or sooner if needed

## 2024-02-18 LAB — URINE CULTURE
MICRO NUMBER:: 16618299
Result:: NO GROWTH
SPECIMEN QUALITY:: ADEQUATE

## 2024-02-20 ENCOUNTER — Telehealth: Admitting: Physician Assistant

## 2024-02-20 DIAGNOSIS — B379 Candidiasis, unspecified: Secondary | ICD-10-CM

## 2024-02-20 DIAGNOSIS — T3695XA Adverse effect of unspecified systemic antibiotic, initial encounter: Secondary | ICD-10-CM

## 2024-02-20 MED ORDER — FLUCONAZOLE 150 MG PO TABS: 150.0000 mg | ORAL_TABLET | ORAL | 0 refills | Status: DC | PRN

## 2024-02-20 NOTE — Progress Notes (Signed)

## 2024-03-24 ENCOUNTER — Telehealth: Admitting: Family Medicine

## 2024-03-24 DIAGNOSIS — J019 Acute sinusitis, unspecified: Secondary | ICD-10-CM | POA: Diagnosis not present

## 2024-03-24 DIAGNOSIS — H6690 Otitis media, unspecified, unspecified ear: Secondary | ICD-10-CM | POA: Diagnosis not present

## 2024-03-24 DIAGNOSIS — B9689 Other specified bacterial agents as the cause of diseases classified elsewhere: Secondary | ICD-10-CM | POA: Diagnosis not present

## 2024-03-24 MED ORDER — AMOXICILLIN-POT CLAVULANATE 875-125 MG PO TABS: 1.0000 | ORAL_TABLET | Freq: Two times a day (BID) | ORAL | 0 refills | Status: DC

## 2024-03-24 NOTE — Progress Notes (Signed)

## 2024-03-30 ENCOUNTER — Encounter: Payer: Self-pay | Admitting: Family Medicine

## 2024-03-30 ENCOUNTER — Telehealth: Payer: Self-pay | Admitting: Neurology

## 2024-03-30 NOTE — Telephone Encounter (Signed)
 Copied from CRM [id number]. Topic: Appointments - Appointment Scheduling >> Mar 30, 2024 12:07 PM Franky GRADE wrote: Patient/patient representative is calling to schedule an appointment. Refer to attachments for appointment information. Patient is calling to schedule a BP check with a nurse, KMS states an order is needed. She would like to come in this afternoon if possible.

## 2024-03-30 NOTE — Telephone Encounter (Signed)
 Okay to schedule a bp check?

## 2024-03-31 NOTE — Telephone Encounter (Signed)
 Pt scheduled for blood pressure check tomorrow

## 2024-04-01 ENCOUNTER — Ambulatory Visit (INDEPENDENT_AMBULATORY_CARE_PROVIDER_SITE_OTHER): Admitting: Family Medicine

## 2024-04-01 VITALS — BP 126/74 | HR 72

## 2024-04-01 DIAGNOSIS — I10 Essential (primary) hypertension: Secondary | ICD-10-CM

## 2024-04-01 NOTE — Progress Notes (Signed)
 Pt here for Blood pressure check per Dr. Antonio.   Pt currently takes: lisinopril -hydrochlorothiazide  10-12.5mg   Pt called on 03/30/24- has been having headaches and elevated BP at home- 155/90s, denies CP, SHOB.  Pt reports compliance with medication.   BP today @ = 126/74 HR = 72   She brought her home cuff- is reading L arm 164/72 HR: 74  R arm: 148/71 HR: 79   Pt states home BP monitor is maybe 72 years old.  Pt advised per Dr. Antonio- needs new BP monitor, schedule appt for headaches. Pt verbalized understanding. Appt scheduled

## 2024-04-09 ENCOUNTER — Encounter: Payer: Self-pay | Admitting: Family Medicine

## 2024-04-12 ENCOUNTER — Ambulatory Visit: Admitting: Family Medicine

## 2024-05-12 ENCOUNTER — Telehealth: Payer: Self-pay | Admitting: Family Medicine

## 2024-05-12 NOTE — Telephone Encounter (Signed)
 Copied from CRM [id number]. Topic: Medicare AWV >> May 12, 2024  3:20 PM Nathanel DEL wrote: Reason for CRM: Called LVM 05/12/2024 to schedule AWV. Please schedule Virtual or Telehealth visits ONLY.   Nathanel Paschal; Care Guide Ambulatory Clinical Support [facility] Direct Dial: [phone]

## 2024-06-02 ENCOUNTER — Encounter: Payer: Self-pay | Admitting: Family Medicine

## 2024-06-02 DIAGNOSIS — Z1211 Encounter for screening for malignant neoplasm of colon: Secondary | ICD-10-CM

## 2024-06-07 ENCOUNTER — Telehealth: Admitting: Physician Assistant

## 2024-06-07 DIAGNOSIS — J019 Acute sinusitis, unspecified: Secondary | ICD-10-CM

## 2024-06-07 DIAGNOSIS — B9689 Other specified bacterial agents as the cause of diseases classified elsewhere: Secondary | ICD-10-CM

## 2024-06-07 MED ORDER — AMOXICILLIN-POT CLAVULANATE 875-125 MG PO TABS: 1.0000 | ORAL_TABLET | Freq: Two times a day (BID) | ORAL | 0 refills | Status: DC

## 2024-06-07 NOTE — Progress Notes (Signed)

## 2024-06-14 DIAGNOSIS — M7062 Trochanteric bursitis, left hip: Secondary | ICD-10-CM | POA: Diagnosis not present

## 2024-07-23 DIAGNOSIS — Z1211 Encounter for screening for malignant neoplasm of colon: Secondary | ICD-10-CM | POA: Diagnosis not present

## 2024-07-24 ENCOUNTER — Telehealth: Admitting: Nurse Practitioner

## 2024-07-24 DIAGNOSIS — J019 Acute sinusitis, unspecified: Secondary | ICD-10-CM

## 2024-07-24 DIAGNOSIS — B9689 Other specified bacterial agents as the cause of diseases classified elsewhere: Secondary | ICD-10-CM

## 2024-07-24 MED ORDER — AMOXICILLIN-POT CLAVULANATE 875-125 MG PO TABS: 1.0000 | ORAL_TABLET | Freq: Two times a day (BID) | ORAL | 0 refills | Status: DC

## 2024-07-24 NOTE — Progress Notes (Signed)
 E-Visit for Sinus Problems  We are sorry that you are not feeling well.  Here is how we plan to help!  Based on what you have shared with me it looks like you have sinusitis.  Sinusitis is inflammation and infection in the sinus cavities of the head.  Based on your presentation I believe you most likely have Acute Bacterial Sinusitis.  This is an infection caused by bacteria and is treated with antibiotics. I have prescribed Augmentin  875mg /125mg  one tablet twice daily with food, for 7 days. You may use an oral decongestant such as Mucinex D or if you have glaucoma or high blood pressure use plain Mucinex. Saline nasal spray help and can safely be used as often as needed for congestion.  If you develop worsening sinus pain, fever or notice severe headache and vision changes, or if symptoms are not better after completion of antibiotic, please schedule an appointment with a health care provider.    Sinus infections are not as easily transmitted as other respiratory infection, however we still recommend that you avoid close contact with loved ones, especially the very young and elderly.  Remember to wash your hands thoroughly throughout the day as this is the number one way to prevent the spread of infection!  Home Care: Only take medications as instructed by your medical team. Complete the entire course of an antibiotic. Do not take these medications with alcohol. A steam or ultrasonic humidifier can help congestion.  You can place a towel over your head and breathe in the steam from hot water coming from a faucet. Avoid close contacts especially the very young and the elderly. Cover your mouth when you cough or sneeze. Always remember to wash your hands.  Get Help Right Away If: You develop worsening fever or sinus pain. You develop a severe head ache or visual changes. Your symptoms persist after you have completed your treatment plan.  Make sure you Understand these instructions. Will  watch your condition. Will get help right away if you are not doing well or get worse.  Your e-visit answers were reviewed by a board certified advanced clinical practitioner to complete your personal care plan.  Depending on the condition, your plan could have included both over the counter or prescription medications.  If there is a problem please reply  once you have received a response from your provider.  Your safety is important to us .  If you have drug allergies check your prescription carefully.    You can use MyChart to ask questions about today's visit, request a non-urgent call back, or ask for a work or school excuse for 24 hours related to this e-Visit. If it has been greater than 24 hours you will need to follow up with your provider, or enter a new e-Visit to address those concerns.  You will get an e-mail in the next two days asking about your experience.  I hope that your e-visit has been valuable and will speed your recovery. Thank you for using e-visits.  I have spent 5 minutes in review of e-visit questionnaire, review and updating patient chart, medical decision making and response to patient.   Latona Krichbaum W Khylee Algeo, NP

## 2024-07-27 LAB — COLOGUARD: COLOGUARD: POSITIVE — AB

## 2024-07-28 ENCOUNTER — Ambulatory Visit: Payer: Self-pay | Admitting: Family Medicine

## 2024-07-28 ENCOUNTER — Encounter: Payer: Self-pay | Admitting: Family Medicine

## 2024-08-02 ENCOUNTER — Other Ambulatory Visit: Payer: Self-pay | Admitting: Family Medicine

## 2024-08-02 DIAGNOSIS — R195 Other fecal abnormalities: Secondary | ICD-10-CM

## 2024-08-03 ENCOUNTER — Ambulatory Visit: Admitting: Family Medicine

## 2024-08-03 ENCOUNTER — Encounter: Payer: Self-pay | Admitting: Cardiovascular Disease

## 2024-08-03 ENCOUNTER — Encounter: Payer: Self-pay | Admitting: Family Medicine

## 2024-08-03 VITALS — BP 128/68 | HR 85 | Temp 98.2°F | Resp 18 | Ht 64.0 in | Wt 166.8 lb

## 2024-08-03 DIAGNOSIS — I10 Essential (primary) hypertension: Secondary | ICD-10-CM

## 2024-08-03 DIAGNOSIS — R195 Other fecal abnormalities: Secondary | ICD-10-CM | POA: Diagnosis not present

## 2024-08-03 NOTE — Addendum Note (Signed)
 Addended by: ANTONIO CYNDEE ROCKERS R on: 08/03/2024 01:23 PM   Modules accepted: Orders

## 2024-08-03 NOTE — Progress Notes (Unsigned)
 -  Subjective:    Patient ID: Dominique Gonzalez, female    DOB: October 08, 1950, 73 y.o.   MRN: [id number]  No chief complaint on file.   HPI Patient is in today for f/u.  Discussed the use of AI scribe software for clinical note transcription with the patient, who gave verbal consent to proceed.  History of Present Illness     Past Medical History:  Diagnosis Date   Aortic insufficiency    mild   B12 deficiency    Cardiac murmur    Diastolic dysfunction    Fatigue    Hip pain, right    HTN (hypertension)    Hyperlipidemia    Leg pain, right    Osteopenia    Shoulder pain, left    Sinusitis, acute    NOS   URI (upper respiratory infection)     Past Surgical History:  Procedure Laterality Date   ABDOMINAL HYSTERECTOMY     age 5   BILATERAL OOPHORECTOMY      Family History  Problem Relation Age of Onset   Hypertension Father    Diabetes Father    Hypertension Mother    Breast cancer Mother     Social History   Socioeconomic History   Marital status: Married    Spouse name: Not on file   Number of children: 2   Years of education: Not on file   Highest education level: 12th grade  Occupational History   Not on file  Tobacco Use   Smoking status: Former    Current packs/day: 0.00    Types: Cigarettes    Quit date: 01/10/1996    Years since quitting: 28.5   Smokeless tobacco: Never   Tobacco comments:    smoked for 10 years,   Vaping Use   Vaping status: Never Used  Substance and Sexual Activity   Alcohol use: No   Drug use: No   Sexual activity: Not on file  Other Topics Concern   Not on file  Social History Narrative   Not on file   Social Drivers of Health   Financial Resource Strain: Low Risk  (08/02/2024)   Overall Financial Resource Strain (CARDIA)    Difficulty of Paying Living Expenses: Not very hard  Food Insecurity: Patient Declined (02/17/2024)   Hunger Vital Sign    Worried About Running Out of Food in the Last Year: Patient  declined    Ran Out of Food in the Last Year: Patient declined  Transportation Needs: No Transportation Needs (02/17/2024)   PRAPARE - Administrator, Civil Service (Medical): No    Lack of Transportation (Non-Medical): No  Physical Activity: Insufficiently Active (02/17/2024)   Exercise Vital Sign    Days of Exercise per Week: 7 days    Minutes of Exercise per Session: 20 min  Stress: No Stress Concern Present (02/17/2024)   [employer] of Occupational Health - Occupational Stress Questionnaire    Feeling of Stress: Only a little  Social Connections: Unknown (08/02/2024)   Social Connection and Isolation Panel    Frequency of Communication with Friends and Family: Not on file    Frequency of Social Gatherings with Friends and Family: Not on file    Attends Religious Services: Not on file    Active Member of Clubs or Organizations: Yes    Attends Banker Meetings: Not on file    Marital Status: Not on file  Intimate Partner Violence: Not At Risk (05/21/2021)   Humiliation,  Afraid, Rape, and Kick questionnaire    Fear of Current or Ex-Partner: No    Emotionally Abused: No    Physically Abused: No    Sexually Abused: No    Outpatient Medications Prior to Visit  Medication Sig Dispense Refill   aspirin 81 MG tablet Take 81 mg by mouth daily.       lisinopril -hydrochlorothiazide  (ZESTORETIC ) 10-12.5 MG tablet Take 1 tablet by mouth daily. 90 tablet 3   fluconazole  (DIFLUCAN ) 150 MG tablet Take 1 tablet (150 mg total) by mouth every 3 (three) days as needed. 2 tablet 0   amoxicillin -clavulanate (AUGMENTIN ) 875-125 MG tablet Take 1 tablet by mouth 2 (two) times daily. 14 tablet 0   promethazine  (PHENERGAN ) 12.5 MG tablet Take 1 tablet (12.5 mg total) by mouth every 8 (eight) hours as needed for nausea or vomiting. 12 tablet 0   Facility-Administered Medications Prior to Visit  Medication Dose Route Frequency Provider Last Rate Last Admin   cyanocobalamin   ((VITAMIN B-12)) injection 1,000 mcg  1,000 mcg Intramuscular Q30 days Antonio Meth, Jamee R, DO   1,000 mcg at 02/25/23 1021    Allergies  Allergen Reactions   Macrobid  [Nitrofurantoin ] Nausea And Vomiting    vomiting   Bactrim [Sulfamethoxazole-Trimethoprim] Hives    ROS     Objective:    Physical Exam  BP 128/68 (BP Location: Right Arm, Patient Position: Sitting, Cuff Size: Normal)   Pulse 85   Temp 98.2 F (36.8 C) (Oral)   Resp 18   Ht 5' 4 (1.626 m)   Wt 166 lb 12.8 oz (75.7 kg)   LMP  (LMP Unknown)   SpO2 98%   BMI 28.63 kg/m  Wt Readings from Last 3 Encounters:  08/03/24 166 lb 12.8 oz (75.7 kg)  02/17/24 170 lb (77.1 kg)  09/18/23 164 lb 12.8 oz (74.8 kg)    Diabetic Foot Exam - Simple   No data filed    Lab Results  Component Value Date   WBC 6.9 03/28/2023   HGB 13.6 03/28/2023   HCT 40.8 03/28/2023   PLT 241.0 03/28/2023   GLUCOSE 115 (H) 03/28/2023   CHOL 159 03/28/2023   TRIG 91.0 03/28/2023   HDL 45.10 03/28/2023   LDLDIRECT 149.3 07/28/2007   LDLCALC 95 03/28/2023   ALT 16 03/28/2023   AST 21 03/28/2023   NA 138 03/28/2023   K 3.6 03/28/2023   CL 104 03/28/2023   CREATININE 0.71 03/28/2023   BUN 8 03/28/2023   CO2 26 03/28/2023   TSH 1.00 03/28/2023   HGBA1C 5.2 12/13/2020    Lab Results  Component Value Date   TSH 1.00 03/28/2023   Lab Results  Component Value Date   WBC 6.9 03/28/2023   HGB 13.6 03/28/2023   HCT 40.8 03/28/2023   MCV 89.3 03/28/2023   PLT 241.0 03/28/2023   Lab Results  Component Value Date   NA 138 03/28/2023   K 3.6 03/28/2023   CO2 26 03/28/2023   GLUCOSE 115 (H) 03/28/2023   BUN 8 03/28/2023   CREATININE 0.71 03/28/2023   BILITOT 0.6 03/28/2023   ALKPHOS 104 03/28/2023   AST 21 03/28/2023   ALT 16 03/28/2023   PROT 6.6 03/28/2023   ALBUMIN 4.1 03/28/2023   CALCIUM 9.1 03/28/2023   EGFR 86 09/24/2021   GFR 85.98 03/28/2023   Lab Results  Component Value Date   CHOL 159 03/28/2023    Lab Results  Component Value Date   HDL 45.10 03/28/2023  Lab Results  Component Value Date   LDLCALC 95 03/28/2023   Lab Results  Component Value Date   TRIG 91.0 03/28/2023   Lab Results  Component Value Date   CHOLHDL 4 03/28/2023   Lab Results  Component Value Date   HGBA1C 5.2 12/13/2020       Assessment & Plan:  There are no diagnoses linked to this encounter.  Rebbecca Osuna R Lowne Chase, DO

## 2024-08-04 DIAGNOSIS — R195 Other fecal abnormalities: Secondary | ICD-10-CM | POA: Insufficient documentation

## 2024-08-12 ENCOUNTER — Other Ambulatory Visit: Payer: Self-pay | Admitting: Nurse Practitioner

## 2024-08-12 DIAGNOSIS — B9689 Other specified bacterial agents as the cause of diseases classified elsewhere: Secondary | ICD-10-CM

## 2024-08-30 ENCOUNTER — Ambulatory Visit (INDEPENDENT_AMBULATORY_CARE_PROVIDER_SITE_OTHER): Admitting: *Deleted

## 2024-08-30 VITALS — Ht 64.0 in | Wt 166.0 lb

## 2024-08-30 DIAGNOSIS — Z0000 Encounter for general adult medical examination without abnormal findings: Secondary | ICD-10-CM | POA: Diagnosis not present

## 2024-08-30 DIAGNOSIS — Z1231 Encounter for screening mammogram for malignant neoplasm of breast: Secondary | ICD-10-CM

## 2024-08-30 DIAGNOSIS — M8580 Other specified disorders of bone density and structure, unspecified site: Secondary | ICD-10-CM

## 2024-08-30 DIAGNOSIS — Z780 Asymptomatic menopausal state: Secondary | ICD-10-CM

## 2024-08-30 NOTE — Patient Instructions (Addendum)
 Dominique Gonzalez,  Thank you for taking the time for your Medicare Wellness Visit. I appreciate your continued commitment to your health goals. Please review the care plan we discussed, and feel free to reach out if I can assist you further.  Please note that Annual Wellness Visits do not include a physical exam. Some assessments may be limited, especially if the visit was conducted virtually. If needed, we may recommend an in-person follow-up with your provider.  Ongoing Care Seeing your primary care provider every 3 to 6 months helps us  monitor your health and provide consistent, personalized care.   Lab visit: 09/07/24 10am, fasting Dr Antonio Meth:  02/03/25 2:20pm Medicare AWV:  08/31/25 3pm, telephone  Referrals If a referral was made during today's visit and you haven't received any updates within two weeks, please contact the referred provider directly to check on the status.  GI Referral (Dr San):  [phone], please call to schedule your consultation to discuss your colonoscopy options. Mammogram ([facility]) due after 01/26/25: [phone] Bone Density ([facility]): [phone]. Due now.  Recommended Screenings:  Health Maintenance  Topic Date Due   Pneumococcal Vaccine for age over 92 (1 of 2 - PCV) Never done   Zoster (Shingles) Vaccine (1 of 2) Never done   Medicare Annual Wellness Visit  05/21/2022   Osteoporosis screening with Bone Density Scan  06/05/2023   Flu Shot  11/23/2024*   Breast Cancer Screening  01/26/2025   Cologuard (Stool DNA test)  07/24/2027   Hepatitis C Screening  Completed   Meningitis B Vaccine  Aged Out   DTaP/Tdap/Td vaccine  Discontinued   COVID-19 Vaccine  Discontinued  *Topic was postponed. The date shown is not the original due date.       08/30/2024    3:10 PM  Advanced Directives  Does Patient Have a Medical Advance Directive? No  Would patient like information on creating a medical advance directive? No - Patient declined   (Declined) Advance directive discussed with you today. Even though you declined this today, please call our office should you change your mind, and we can give you the proper paperwork for you to fill out.  You may also find Information on Advanced Care Planning at  [place]  Secretary of [facility] Advance Health Care Directives Advance Health Care Directives ([email or web address]     Vision: Annual vision screenings are recommended for early detection of glaucoma, cataracts, and diabetic retinopathy. These exams can also reveal signs of chronic conditions such as diabetes and high blood pressure.   Here is the list of [facility] Doctors:    [facility]            [address]  [address]    [place], [place] [address] [place], [place] [address]   Phone:  [phone] Phone:  [phone]   Fax:  [phone] Fax:  [phone]     [facility] [address]           [address] [place]  [place], [place] [address]   [place], [place] [address] Phone:  [phone]   Phone: [phone] Fax:  [phone]    Fax: [phone]  [facility])         [facility] [address]     [address] [place], [place] [address]   [place], [place]  [address] Phone:  [phone]   Phone: [phone] Fax:  [phone]    Fax: [phone]  [facility]   [facility] [address],   [address] [place], [place] [address]   [place], [place] [address] Phone:  [phone]   Phone: [phone] Fax:  [phone]    Fax:  [phone]  [facility]     [address] [place],             [place], [place] [address]    Phone:  [phone]    Fax:  [phone]           [facility] [address],    [address] [place], [place] [address]   [place], [place] [address] Phone:  [phone]   [phone] Fax:   [phone]    [phone]  [facility] [address],        [address] [place], [place] [address]        [place], [place] [address] Phone:  [phone]                   Phone:  [phone] Fax:  [phone]         Fax:  [phone]  [facility]         [facility] [address]                     [address],  [place], [place] [address]        [place], [place] [address] Phone:  [phone]                   [phone] Fax:  [phone]         Fax: [phone]  Dental: Annual dental screenings help detect early signs of oral cancer, gum disease, and other conditions linked to overall health, including heart disease and diabetes.  Please see the attached documents for additional preventive care recommendations.

## 2024-08-30 NOTE — Progress Notes (Signed)
 "  Chief Complaint  Patient presents with   Medicare Wellness     Subjective:   Dominique Gonzalez is a 74 y.o. female who presents for a Medicare Annual Wellness Visit.  Visit info / Clinical Intake: Medicare Wellness Visit Type:: Subsequent Annual Wellness Visit Persons participating in visit and providing information:: patient Medicare Wellness Visit Mode:: Telephone If telephone:: video declined Since this visit was completed virtually, some vitals may be partially provided or unavailable. Missing vitals are due to the limitations of the virtual format.: Unable to obtain vitals - no equipment If Telephone or Video please confirm:: I connected with patient using audio/video enable telemedicine. I verified patient identity with two identifiers, discussed telehealth limitations, and patient agreed to proceed. Patient Location:: home Provider Location:: office Interpreter Needed?: No Pre-visit prep was completed: yes AWV questionnaire completed by patient prior to visit?: no Living arrangements:: with family/others (sisters) Patient's Overall Health Status Rating: very good Typical amount of pain: none Does pain affect daily life?: no Are you currently prescribed opioids?: no  Dietary Habits and Nutritional Risks How many meals a day?: 2 Eats fruit and vegetables daily?: yes (doesn't eat meat) Most meals are obtained by: preparing own meals In the last 2 weeks, have you had any of the following?: none Diabetic:: no  Functional Status Activities of Daily Living (to include ambulation/medication): Independent Ambulation: Independent Medication Administration: Independent Home Management (perform basic housework or laundry): Independent Manage your own finances?: yes Primary transportation is: driving Concerns about vision?: no *vision screening is required for WT[facility]* (past due and list sent to pt) Concerns about hearing?: no  Fall Screening Falls in the past year?: 0 Number of  falls in past year: 0 Was there an injury with Fall?: 0 Fall Risk Category Calculator: 0 Patient Fall Risk Level: Low Fall Risk  Fall Risk Patient at Risk for Falls Due to: No Fall Risks Fall risk Follow up: Falls evaluation completed  Home and Transportation Safety: All rugs have non-skid backing?: N/A, no rugs All stairs or steps have railings?: N/A, no stairs Grab bars in the bathtub or shower?: (!) no Have non-skid surface in bathtub or shower?: yes Good home lighting?: yes Regular seat belt use?: yes Hospital stays in the last year:: no  Cognitive Assessment Difficulty concentrating, remembering, or making decisions? : no Will 6CIT or Mini Cog be Completed: yes What year is it?: 0 points What month is it?: 0 points Give patient an address phrase to remember (5 components): [address], [place] [employer] About what time is it?: 0 points Count backwards from 20 to 1: 0 points Say the months of the year in reverse: 4 points (Pt got to August and said she didn't want to complete it.) Repeat the address phrase from earlier: 0 points 6 CIT Score: 4 points  Advance Directives (For Healthcare) Does Patient Have a Medical Advance Directive?: No Would patient like information on creating a medical advance directive?: No - Patient declined  Reviewed/Updated  Reviewed/Updated: Reviewed All (Medical, Surgical, Family, Medications, Allergies, Care Teams, Patient Goals)    Allergies (verified) Macrobid  [nitrofurantoin ] and Bactrim [sulfamethoxazole-trimethoprim]   Current Medications (verified) Outpatient Encounter Medications as of 08/30/2024  Medication Sig   aspirin 81 MG tablet Take 81 mg by mouth daily.     lisinopril -hydrochlorothiazide  (ZESTORETIC ) 10-12.5 MG tablet Take 1 tablet by mouth daily.   Facility-Administered Encounter Medications as of 08/30/2024  Medication   cyanocobalamin  ((VITAMIN B-12)) injection 1,000 mcg    History: Past Medical History:  Diagnosis Date   Aortic insufficiency    mild   B12 deficiency    Cardiac murmur    Diastolic dysfunction    Fatigue    Hip pain, right    HTN (hypertension)    Hyperlipidemia    Leg pain, right    Osteopenia    Shoulder pain, left    Sinusitis, acute    NOS   URI (upper respiratory infection)    Past Surgical History:  Procedure Laterality Date   ABDOMINAL HYSTERECTOMY     age 50   BILATERAL OOPHORECTOMY     Family History  Problem Relation Age of Onset   Hypertension Father    Diabetes Father    Hypertension Mother    Breast cancer Mother    Social History   Occupational History   Not on file  Tobacco Use   Smoking status: Former    Current packs/day: 0.00    Types: Cigarettes    Quit date: 01/10/1996    Years since quitting: 28.6   Smokeless tobacco: Never   Tobacco comments:    smoked for 10 years,   Vaping Use   Vaping status: Never Used  Substance and Sexual Activity   Alcohol use: No   Drug use: No   Sexual activity: Not on file   Tobacco Counseling Counseling given: Not Answered Tobacco comments: smoked for 10 years,   SDOH Screenings   Food Insecurity: No Food Insecurity (08/30/2024)  Housing: Low Risk (08/30/2024)  Transportation Needs: No Transportation Needs (08/30/2024)  Utilities: Not At Risk (08/30/2024)  Depression (PHQ2-9): Low Risk (08/30/2024)  Financial Resource Strain: Low Risk (08/02/2024)  Physical Activity: Insufficiently Active (08/30/2024)  Social Connections: Moderately Integrated (08/30/2024)  Stress: No Stress Concern Present (08/30/2024)  Tobacco Use: Medium Risk (08/30/2024)   See flowsheets for full screening details  Depression Screen PHQ 2 & 9 Depression Scale- Over the past 2 weeks, how often have you been bothered by any of the following problems? Little interest or pleasure in doing things: 0 Feeling down, depressed, or hopeless (PHQ Adolescent also includes...irritable): 0 PHQ-2 Total Score: 0 Trouble falling or staying  asleep, or sleeping too much: 0 Feeling tired or having little energy: 0 Poor appetite or overeating (PHQ Adolescent also includes...weight loss): 0 Feeling bad about yourself - or that you are a failure or have let yourself or your family down: 0 Trouble concentrating on things, such as reading the newspaper or watching television (PHQ Adolescent also includes...like school work): 0 Moving or speaking so slowly that other people could have noticed. Or the opposite - being so fidgety or restless that you have been moving around a lot more than usual: 0 Thoughts that you would be better off dead, or of hurting yourself in some way: 0 PHQ-9 Total Score: 0     Goals Addressed             This Visit's Progress    Patient Stated   On track    Continue eating healthier             Objective:    Today's Vitals   08/30/24 1459  Weight: 166 lb (75.3 kg)  Height: 5' 4 (1.626 m)   Body mass index is 28.49 kg/m.  Hearing/Vision screen No results found. Immunizations and Health Maintenance Health Maintenance  Topic Date Due   Bone Density Scan  06/05/2023   Influenza Vaccine  11/23/2024 (Originally 03/26/2024)   Mammogram  01/26/2025   Medicare Annual Wellness (  AWV)  08/30/2025   Fecal DNA (Cologuard)  07/24/2027   Hepatitis C Screening  Completed   Meningococcal B Vaccine  Aged Out   DTaP/Tdap/Td  Discontinued   Pneumococcal Vaccine: 50+ Years  Discontinued   COVID-19 Vaccine  Discontinued   Zoster Vaccines- Shingrix  Discontinued        Assessment/Plan:  This is a routine wellness examination for Yulonda.  Patient Care Team: Antonio Meth, Jamee SAUNDERS, DO as PCP - General Verlin Lonni BIRCH, MD as PCP - Cardiology (Cardiology)  I have personally reviewed and noted the following in the patients chart:   Medical and social history Use of alcohol, tobacco or illicit drugs  Current medications and supplements including opioid prescriptions. Functional ability and  status Nutritional status Physical activity Advanced directives List of other physicians Hospitalizations, surgeries, and ER visits in previous 12 months Vitals Screenings to include cognitive, depression, and falls Referrals and appointments  Orders Placed This Encounter  Procedures   MM 3D SCREENING MAMMOGRAM BILATERAL BREAST    Standing Status:   Future    Expected Date:   01/27/2025    Expiration Date:   08/30/2025    Scheduling Instructions:     [facility]    Reason for Exam (SYMPTOM  OR DIAGNOSIS REQUIRED):   breast cancer screening    Preferred imaging location?:   External   DG Bone Density    Standing Status:   Future    Expected Date:   08/30/2024    Expiration Date:   08/30/2025    Reason for Exam (SYMPTOM  OR DIAGNOSIS REQUIRED):   postmenopausal estrogen deficiency, osteopenia    Preferred imaging location?:   [facility]   In addition, I have reviewed and discussed with patient certain preventive protocols, quality metrics, and best practice recommendations. A written personalized care plan for preventive services as well as general preventive health recommendations were provided to patient.   Lolita Libra, CMA   08/30/2024   Return in 1 year (on 08/30/2025).  After Visit Summary: (MyChart) Due to this being a telephonic visit, the after visit summary with patients personalized plan was offered to patient via MyChart   Nurse Notes: HM Addressed: Mammogram ordered DEXA ordered Pt declines all vaccines.  Colonoscopy info given to call and schedule consult.  "

## 2024-08-31 NOTE — Addendum Note (Signed)
 Addended by: MAREE HUM A on: 08/31/2024 08:06 AM   Modules accepted: Orders

## 2024-09-07 ENCOUNTER — Other Ambulatory Visit

## 2024-09-09 ENCOUNTER — Encounter: Payer: Self-pay | Admitting: Gastroenterology

## 2024-09-10 ENCOUNTER — Other Ambulatory Visit

## 2024-09-12 ENCOUNTER — Other Ambulatory Visit: Payer: Self-pay | Admitting: Cardiovascular Disease

## 2024-09-12 DIAGNOSIS — I10 Essential (primary) hypertension: Secondary | ICD-10-CM

## 2024-09-13 ENCOUNTER — Other Ambulatory Visit (INDEPENDENT_AMBULATORY_CARE_PROVIDER_SITE_OTHER)

## 2024-09-13 DIAGNOSIS — I10 Essential (primary) hypertension: Secondary | ICD-10-CM | POA: Diagnosis not present

## 2024-09-14 ENCOUNTER — Ambulatory Visit: Payer: Self-pay | Admitting: Family Medicine

## 2024-09-14 LAB — CBC WITH DIFFERENTIAL/PLATELET
Basophils Absolute: 0 K/uL (ref 0.0–0.1)
Basophils Relative: 0.2 % (ref 0.0–3.0)
Eosinophils Absolute: 0.1 K/uL (ref 0.0–0.7)
Eosinophils Relative: 1.9 % (ref 0.0–5.0)
HCT: 39.8 % (ref 36.0–46.0)
Hemoglobin: 13.7 g/dL (ref 12.0–15.0)
Lymphocytes Relative: 31.1 % (ref 12.0–46.0)
Lymphs Abs: 2.2 K/uL (ref 0.7–4.0)
MCHC: 34.4 g/dL (ref 30.0–36.0)
MCV: 87.7 fl (ref 78.0–100.0)
Monocytes Absolute: 0.5 K/uL (ref 0.1–1.0)
Monocytes Relative: 6.4 % (ref 3.0–12.0)
Neutro Abs: 4.3 K/uL (ref 1.4–7.7)
Neutrophils Relative %: 60.4 % (ref 43.0–77.0)
Platelets: 234 K/uL (ref 150.0–400.0)
RBC: 4.54 Mil/uL (ref 3.87–5.11)
RDW: 13.6 % (ref 11.5–15.5)
WBC: 7.1 K/uL (ref 4.0–10.5)

## 2024-09-14 LAB — COMPREHENSIVE METABOLIC PANEL WITH GFR
ALT: 15 U/L (ref 3–35)
AST: 20 U/L (ref 5–37)
Albumin: 4.1 g/dL (ref 3.5–5.2)
Alkaline Phosphatase: 92 U/L (ref 39–117)
BUN: 5 mg/dL — ABNORMAL LOW (ref 6–23)
CO2: 27 meq/L (ref 19–32)
Calcium: 9.1 mg/dL (ref 8.4–10.5)
Chloride: 102 meq/L (ref 96–112)
Creatinine, Ser: 0.74 mg/dL (ref 0.40–1.20)
GFR: 80.98 mL/min
Glucose, Bld: 110 mg/dL — ABNORMAL HIGH (ref 70–99)
Potassium: 3.7 meq/L (ref 3.5–5.1)
Sodium: 137 meq/L (ref 135–145)
Total Bilirubin: 0.6 mg/dL (ref 0.2–1.2)
Total Protein: 6.6 g/dL (ref 6.0–8.3)

## 2024-09-14 LAB — TSH: TSH: 1.1 u[IU]/mL (ref 0.35–5.50)

## 2024-09-14 LAB — LIPID PANEL
Cholesterol: 165 mg/dL (ref 28–200)
HDL: 40.4 mg/dL
LDL Cholesterol: 91 mg/dL (ref 10–99)
NonHDL: 124.44
Total CHOL/HDL Ratio: 4
Triglycerides: 167 mg/dL — ABNORMAL HIGH (ref 10.0–149.0)
VLDL: 33.4 mg/dL (ref 0.0–40.0)

## 2024-09-16 ENCOUNTER — Other Ambulatory Visit (HOSPITAL_BASED_OUTPATIENT_CLINIC_OR_DEPARTMENT_OTHER)

## 2024-09-20 ENCOUNTER — Ambulatory Visit: Admitting: Cardiovascular Disease

## 2024-10-06 ENCOUNTER — Other Ambulatory Visit (HOSPITAL_BASED_OUTPATIENT_CLINIC_OR_DEPARTMENT_OTHER)

## 2024-10-18 ENCOUNTER — Ambulatory Visit: Admitting: Gastroenterology

## 2024-11-16 ENCOUNTER — Ambulatory Visit: Admitting: Gastroenterology

## 2024-12-17 ENCOUNTER — Ambulatory Visit: Admitting: Cardiovascular Disease

## 2025-02-03 ENCOUNTER — Ambulatory Visit: Admitting: Family Medicine

## 2025-08-31 ENCOUNTER — Ambulatory Visit
